# Patient Record
Sex: Male | Born: 1940 | Race: White | Hispanic: No | Marital: Married | Smoking: Never smoker
Health system: Southern US, Community
[De-identification: ages and names within clinical notes are randomized; demographics above are authoritative.]

## PROBLEM LIST (undated history)

## (undated) DIAGNOSIS — H9190 Unspecified hearing loss, unspecified ear: Secondary | ICD-10-CM

## (undated) DIAGNOSIS — E785 Hyperlipidemia, unspecified: Secondary | ICD-10-CM

## (undated) DIAGNOSIS — IMO0002 Reserved for concepts with insufficient information to code with codable children: Secondary | ICD-10-CM

## (undated) DIAGNOSIS — B019 Varicella without complication: Secondary | ICD-10-CM

## (undated) DIAGNOSIS — I10 Essential (primary) hypertension: Secondary | ICD-10-CM

## (undated) HISTORY — DX: Hyperlipidemia, unspecified: E78.5

## (undated) HISTORY — PX: OTHER SURGICAL HISTORY: SHX169

## (undated) HISTORY — PX: BLEPHAROPLASTY: SUR158

## (undated) HISTORY — DX: Unspecified hearing loss, unspecified ear: H91.90

## (undated) HISTORY — DX: Reserved for concepts with insufficient information to code with codable children: IMO0002

## (undated) HISTORY — DX: Essential (primary) hypertension: I10

## (undated) HISTORY — PX: CHOLECYSTECTOMY: SHX55

## (undated) HISTORY — DX: Varicella without complication: B01.9

---

## 2014-11-30 DIAGNOSIS — E780 Pure hypercholesterolemia: Secondary | ICD-10-CM | POA: Diagnosis not present

## 2014-11-30 DIAGNOSIS — I10 Essential (primary) hypertension: Secondary | ICD-10-CM | POA: Diagnosis not present

## 2015-06-07 LAB — CBC AND DIFFERENTIAL
HEMATOCRIT: 37 % — AB (ref 41–53)
HEMOGLOBIN: 13.3 g/dL — AB (ref 13.5–17.5)
PLATELETS: 214 10*3/uL (ref 150–399)
WBC: 4.9 10^3/mL

## 2015-06-07 LAB — PSA: PSA: 1.26

## 2015-09-15 LAB — HEMOGLOBIN A1C: Hemoglobin A1C: 6.1

## 2016-01-06 DIAGNOSIS — H04129 Dry eye syndrome of unspecified lacrimal gland: Secondary | ICD-10-CM | POA: Diagnosis not present

## 2016-01-06 DIAGNOSIS — H43399 Other vitreous opacities, unspecified eye: Secondary | ICD-10-CM | POA: Diagnosis not present

## 2016-01-06 DIAGNOSIS — Z961 Presence of intraocular lens: Secondary | ICD-10-CM | POA: Diagnosis not present

## 2016-02-02 ENCOUNTER — Ambulatory Visit: Payer: Self-pay | Admitting: Family

## 2016-02-29 ENCOUNTER — Ambulatory Visit (INDEPENDENT_AMBULATORY_CARE_PROVIDER_SITE_OTHER): Payer: Medicare Other | Admitting: Family

## 2016-02-29 ENCOUNTER — Encounter: Payer: Self-pay | Admitting: Family

## 2016-02-29 VITALS — BP 118/86 | HR 45 | Temp 98.0°F | Resp 14 | Wt 185.0 lb

## 2016-02-29 DIAGNOSIS — E782 Mixed hyperlipidemia: Secondary | ICD-10-CM

## 2016-02-29 DIAGNOSIS — I10 Essential (primary) hypertension: Secondary | ICD-10-CM | POA: Diagnosis not present

## 2016-02-29 MED ORDER — LISINOPRIL 5 MG PO TABS: 5.0000 mg | ORAL_TABLET | Freq: Every day | ORAL | 1 refills | Status: DC

## 2016-02-29 MED ORDER — ATORVASTATIN CALCIUM 40 MG PO TABS: 40.0000 mg | ORAL_TABLET | Freq: Every day | ORAL | 1 refills | Status: DC

## 2016-02-29 NOTE — Assessment & Plan Note (Signed)
Blood pressure appears adequately controlled below goal 140/90 with current regimen and no adverse side effects. Denies worse headache of life with no symptoms of end organ damage noted upon physical exam. Continue current dosage of lisinopril. Encouraged continue monitor blood pressure at home and follow low-sodium diet.

## 2016-02-29 NOTE — Patient Instructions (Addendum)
Thank you for choosing [facility].  SUMMARY AND INSTRUCTIONS:  Medication:  Continue to take your medications as prescribed.   Schedule a time for your annual wellness visit.  Your prescription(s) have been submitted to your pharmacy or been printed and provided for you. Please take as directed and contact our office if you believe you are having problem(s) with the medication(s) or have any questions.  Follow up:  If your symptoms worsen or fail to improve, please contact our office for further instruction, or in case of emergency go directly to the emergency room at the closest medical facility.

## 2016-02-29 NOTE — Progress Notes (Signed)
Subjective:    Patient ID: Travis Craig, male    DOB: [birth year], 75 y.o.   MRN: [id number]  Chief Complaint  Patient presents with  . Establish Care    refill of atorvastatin and BP meds    HPI:  Travis Craig is a 75 y.o. male who  has a past medical history of Chicken pox; Hyperlipidemia; Hypertension; and Ulcer ([place]). and presents today for an office visit to establish care.   1.) Hypertension -Currently maintained on lisinopril. Reports taking medication as prescribed and denies adverse side effects or cough. Blood pressures at home appears to be well controlled. Denies worse headache of life or new symptoms of end organ damage.    2.) Hyperlipidemia- Currently maintained on atorvastatin. Reports taking the medication as prescribed and denies adverse side effects or myalgias. Previous blood work reviewed and appears to be well controlled.    No Known Allergies    No outpatient prescriptions prior to visit.   No facility-administered medications prior to visit.      Past Medical History:  Diagnosis Date  . Chicken pox   . Hyperlipidemia   . Hypertension   . Ulcer [facility])       Past Surgical History:  Procedure Laterality Date  . CHOLECYSTECTOMY    . Ulcer surgery        Family History  Problem Relation Age of Onset  . Hypertension Father   . Alzheimer's disease Father       Social History   Social History  . Marital status: Married    Spouse name: N/A  . Number of children: 2  . Years of education: 16   Occupational History  . Retired    Social History Main Topics  . Smoking status: Never Smoker  . Smokeless tobacco: Never Used  . Alcohol use 1.2 oz/week    2 Glasses of wine per week  . Drug use: No  . Sexual activity: Not on file   Other Topics Concern  . Not on file   Social History Narrative   Fun: Active in the [place].   Denies abuse and feels safe at home.    Daughter has had a stroke and is limited walking.        Review of Systems  Constitutional: Negative for chills and fever.  Eyes:       Negative for changes in vision  Respiratory: Negative for cough, chest tightness and wheezing.   Cardiovascular: Negative for chest pain, palpitations and leg swelling.  Neurological: Negative for dizziness, weakness and light-headedness.       Objective:    BP 118/86 (BP Location: Left Arm, Patient Position: Sitting, Cuff Size: Large)   Pulse (!) 45   Temp 98 F (36.7 C) (Oral)   Resp 14   Wt 185 lb (83.9 kg)   SpO2 98%  Nursing note and vital signs reviewed.  Physical Exam  Constitutional: He is oriented to person, place, and time. He appears well-developed and well-nourished. No distress.  Hearing aids in place. Dressed appropriately for situation and answers all questions appropriately.  Cardiovascular: Normal rate, regular rhythm, normal heart sounds and intact distal pulses.   Pulmonary/Chest: Effort normal and breath sounds normal.  Neurological: He is alert and oriented to person, place, and time.  Skin: Skin is warm and dry.  Psychiatric: He has a normal mood and affect. His behavior is normal. Judgment and thought content normal.       Assessment & Plan:  Problem List Items Addressed This Visit      Cardiovascular and Mediastinum   Essential hypertension - Primary    Blood pressure appears adequately controlled below goal 140/90 with current regimen and no adverse side effects. Denies worse headache of life with no symptoms of end organ damage noted upon physical exam. Continue current dosage of lisinopril. Encouraged continue monitor blood pressure at home and follow low-sodium diet.      Relevant Medications   aspirin EC 81 MG tablet   atorvastatin (LIPITOR) 40 MG tablet   lisinopril (PRINIVIL,ZESTRIL) 5 MG tablet     Other   Mixed hyperlipidemia    Previous cholesterol reviewed in within normal ranges with current regimen and no adverse side effects or myalgias. No  cardiac symptoms presently. Continue current dosage of atorvastatin.      Relevant Medications   aspirin EC 81 MG tablet   atorvastatin (LIPITOR) 40 MG tablet   lisinopril (PRINIVIL,ZESTRIL) 5 MG tablet       I have discontinued Mr. Veal's tadalafil. I have also changed his atorvastatin and lisinopril. Additionally, I am having him maintain his aspirin EC, Multiple Vitamin (MULTI-VITAMIN DAILY PO), and Omega-3.   Meds ordered this encounter  Medications  . DISCONTD: lisinopril (PRINIVIL,ZESTRIL) 5 MG tablet    Sig: Take 5 mg by mouth daily.  Marland Kitchen DISCONTD: atorvastatin (LIPITOR) 40 MG tablet    Sig: Take 40 mg by mouth daily.  Marland Kitchen aspirin EC 81 MG tablet    Sig: Take 81 mg by mouth daily.  . Multiple Vitamin (MULTI-VITAMIN DAILY PO)    Sig: Take by mouth.  . DISCONTD: tadalafil (CIALIS) 20 MG tablet    Sig: Take 20 mg by mouth daily as needed for erectile dysfunction.  . Omega-3 1400 MG CAPS    Sig: Take 1,600 mg by mouth.  Marland Kitchen atorvastatin (LIPITOR) 40 MG tablet    Sig: Take 1 tablet (40 mg total) by mouth daily.    Dispense:  90 tablet    Refill:  1    Order Specific Question:   Supervising Provider    Answer:   Pricilla Holm A [id number]  . lisinopril (PRINIVIL,ZESTRIL) 5 MG tablet    Sig: Take 1 tablet (5 mg total) by mouth daily.    Dispense:  90 tablet    Refill:  1    Order Specific Question:   Supervising Provider    Answer:   Pricilla Holm A [id number]     Follow-up: Return in about 4 months (around 06/28/2016), or if symptoms worsen or fail to improve.  Mauricio Po, FNP

## 2016-02-29 NOTE — Assessment & Plan Note (Signed)
Previous cholesterol reviewed in within normal ranges with current regimen and no adverse side effects or myalgias. No cardiac symptoms presently. Continue current dosage of atorvastatin.

## 2016-03-07 ENCOUNTER — Encounter: Payer: Self-pay | Admitting: Family

## 2016-08-09 ENCOUNTER — Other Ambulatory Visit: Payer: Self-pay | Admitting: Family

## 2016-08-09 DIAGNOSIS — I10 Essential (primary) hypertension: Secondary | ICD-10-CM

## 2016-08-09 DIAGNOSIS — E782 Mixed hyperlipidemia: Secondary | ICD-10-CM

## 2017-01-16 DIAGNOSIS — Z23 Encounter for immunization: Secondary | ICD-10-CM | POA: Diagnosis not present

## 2017-01-17 DIAGNOSIS — H43399 Other vitreous opacities, unspecified eye: Secondary | ICD-10-CM | POA: Diagnosis not present

## 2017-01-17 DIAGNOSIS — Z961 Presence of intraocular lens: Secondary | ICD-10-CM | POA: Diagnosis not present

## 2017-01-17 DIAGNOSIS — H04129 Dry eye syndrome of unspecified lacrimal gland: Secondary | ICD-10-CM | POA: Diagnosis not present

## 2017-03-22 ENCOUNTER — Encounter: Payer: Self-pay | Admitting: Nurse Practitioner

## 2017-03-22 ENCOUNTER — Other Ambulatory Visit (INDEPENDENT_AMBULATORY_CARE_PROVIDER_SITE_OTHER): Payer: Medicare Other

## 2017-03-22 ENCOUNTER — Ambulatory Visit (INDEPENDENT_AMBULATORY_CARE_PROVIDER_SITE_OTHER): Payer: Medicare Other | Admitting: Nurse Practitioner

## 2017-03-22 VITALS — BP 180/90 | HR 43 | Temp 98.1°F | Resp 16 | Ht 71.0 in | Wt 186.8 lb

## 2017-03-22 DIAGNOSIS — R001 Bradycardia, unspecified: Secondary | ICD-10-CM | POA: Diagnosis not present

## 2017-03-22 DIAGNOSIS — I10 Essential (primary) hypertension: Secondary | ICD-10-CM | POA: Diagnosis not present

## 2017-03-22 DIAGNOSIS — E782 Mixed hyperlipidemia: Secondary | ICD-10-CM

## 2017-03-22 DIAGNOSIS — D649 Anemia, unspecified: Secondary | ICD-10-CM | POA: Diagnosis not present

## 2017-03-22 LAB — LIPID PANEL
CHOL/HDL RATIO: 2
Cholesterol: 126 mg/dL (ref 0–200)
HDL: 58.3 mg/dL (ref >39.00)
LDL CALC: 57 mg/dL (ref 0–99)
NonHDL: 67.48
TRIGLYCERIDES: 53 mg/dL (ref 0.0–149.0)
VLDL: 10.6 mg/dL (ref 0.0–40.0)

## 2017-03-22 LAB — COMPREHENSIVE METABOLIC PANEL
ALT: 22 U/L (ref 0–53)
AST: 30 U/L (ref 0–37)
Albumin: 3.9 g/dL (ref 3.5–5.2)
Alkaline Phosphatase: 66 U/L (ref 39–117)
BILIRUBIN TOTAL: 1.1 mg/dL (ref 0.2–1.2)
BUN: 12 mg/dL (ref 6–23)
CALCIUM: 8.6 mg/dL (ref 8.4–10.5)
CHLORIDE: 105 meq/L (ref 96–112)
CO2: 26 meq/L (ref 19–32)
CREATININE: 0.86 mg/dL (ref 0.40–1.50)
GFR: 91.49 mL/min (ref >60.00)
GLUCOSE: 98 mg/dL (ref 70–99)
Potassium: 4.2 mEq/L (ref 3.5–5.1)
SODIUM: 136 meq/L (ref 135–145)
Total Protein: 6.4 g/dL (ref 6.0–8.3)

## 2017-03-22 LAB — CBC
HEMATOCRIT: 38.9 % — AB (ref 39.0–52.0)
Hemoglobin: 13.4 g/dL (ref 13.0–17.0)
MCHC: 34.4 g/dL (ref 30.0–36.0)
MCV: 101.7 fl — AB (ref 78.0–100.0)
Platelets: 159 10*3/uL (ref 150.0–400.0)
RBC: 3.83 Mil/uL — AB (ref 4.22–5.81)
RDW: 12.9 % (ref 11.5–15.5)
WBC: 3.9 10*3/uL — ABNORMAL LOW (ref 4.0–10.5)

## 2017-03-22 MED ORDER — LISINOPRIL-HYDROCHLOROTHIAZIDE 10-12.5 MG PO TABS: 1.0000 | ORAL_TABLET | Freq: Every day | ORAL | 3 refills | Status: DC

## 2017-03-22 NOTE — Progress Notes (Signed)
Subjective:    Patient ID: Travis Craig, male    DOB: 10-Feb-1941, 76 y.o.   MRN: [id number]  HPI Mr. Washko is a 76 yo male who presents today to establish care. He is transferring to me from another provider in the same clinic. He also receives care from the [place]  Hypertension- maintained on lisinopril 5 mg daily. He reports taking the blood pressure medication daily as prescribed. hes not had any adverse effects of the medication. He denies headaches, vision changes. He does check his blood pressure every Tuesday and he's had labile readings-some "too low and some too high." he did not record the blood pressures.  BP Readings from Last 3 Encounters:  03/22/17 (!) 180/90  02/29/16 118/86    Hyperlipidemia- maintained on atorvastatin. Reports he takes the medication daily. Denies any adverse effects, myalgias.  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 He has had 2 stress tests at the [facility] In the past 5 years and were told they were both normal. He was not told to return for follow up care. He checks his pulse rate every Tuesday and the readings are always between 45-50. He is completely asymptomatic and denies weakness, dizziness, syncope, falls, chest pain, palpations, shortness of breath. 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 He walks about 5 miles a day.  Review of Systems  See HPI  Past Medical History:  Diagnosis Date  . Chicken pox   . Hyperlipidemia   . Hypertension   . Ulcer      Social History   Socioeconomic History  . Marital status: Married    Spouse name: Not on file  . Number of children: 2  . Years of education: 28  . Highest education level: Not on file  Social Needs  . Financial resource strain: Not on file  . Food insecurity - worry: Not on file  . Food insecurity - inability: Not on file  . Transportation needs - medical: Not on file  . Transportation  needs - non-medical: Not on file  Occupational History  . Occupation: Retired  Tobacco Use  . Smoking status: Never Smoker  . Smokeless tobacco: Never Used  Substance and Sexual Activity  . Alcohol use: Yes    Alcohol/week: 1.2 oz    Types: 2 Glasses of wine per week  . Drug use: No  . Sexual activity: Not on file  Other Topics Concern  . Not on file  Social History Narrative   Fun: Active in the [place].   Denies abuse and feels safe at home.    Daughter has had a stroke and is limited walking.     Past Surgical History:  Procedure Laterality Date  . CHOLECYSTECTOMY    . Ulcer surgery      Family History  Problem Relation Age of Onset  . Hypertension Father   . Alzheimer's disease Father     No Known Allergies  Current Outpatient Medications on File Prior to Visit  Medication Sig Dispense Refill  . aspirin EC 81 MG tablet Take 81 mg by mouth daily.    Marland Kitchen atorvastatin (LIPITOR) 40 MG tablet TAKE 1 TABLET DAILY 90 tablet 1  . lisinopril (PRINIVIL,ZESTRIL) 5 MG tablet TAKE 1 TABLET DAILY 90 tablet 1  . Multiple Vitamin (MULTI-VITAMIN DAILY PO) Take by mouth.    . Omega-3 1400 MG CAPS Take 1,600 mg by mouth.  No current facility-administered medications on file prior to visit.     BP (!) 180/90 (BP Location: Left Arm, Patient Position: Sitting, Cuff Size: Large)   Pulse (!) 43   Temp 98.1 F (36.7 C) (Oral)   Resp 16   Ht 5\' 11"  (1.803 m)   Wt 186 lb 12.8 oz (84.7 kg)   SpO2 98%   BMI 26.05 kg/m      Objective:   Physical Exam  Constitutional: He is oriented to person, place, and time. He appears well-developed and well-nourished. He does not appear ill. No distress.  Appears stated age.  HENT:  Head: Normocephalic and atraumatic.  Cardiovascular: Regular rhythm, normal heart sounds and intact distal pulses.  Slow heart rate  Pulmonary/Chest: Effort normal and breath sounds normal. No respiratory distress.  Musculoskeletal:  BLE with multiple  varicose veins, no redness, tenderness. BLE with mild nonpitting edema.  Neurological: He is alert and oriented to person, place, and time. Coordination normal.  Skin: Skin is warm and dry.  Psychiatric: He has a normal mood and affect. Judgment and thought content normal.      Assessment & Plan:

## 2017-03-22 NOTE — Assessment & Plan Note (Signed)
Blood pressure above goal 150/90 on lisinopril 5mg  daily. Denies headaches, vision changes.Marland Kitchen He walks 5 miles daily for exercise. We will start new bp medication:  - lisinopril-hydrochlorothiazide (PRINZIDE,ZESTORETIC) 10-12.5 MG tablet; Take 1 tablet by mouth daily.  Dispense: 30 tablet; Refill: 3 - Comprehensive metabolic panel; Future Hell keep BP log and RTC in 2 weeks with readings for a follow up visit.

## 2017-03-22 NOTE — Assessment & Plan Note (Signed)
CBC panels showed decreasd H&H last year. He reports past history of anemia. He is not currently maintained on medications for this. - CBC; Future today Will consider further workup if needed pending CBC

## 2017-03-22 NOTE — Patient Instructions (Addendum)
Please head downstairs for lab work.  I have sent a new blood pressure medication to your pharmacy lisinopril 10- HCTZ 12.5. This medication has a diuretic combined with your lisinopril, hopefully to bring your blood pressure down and maybe help with your edema as well. (please stop taking the lisinopril 5mg )  Please try to check your blood pressure once daily or at least a few times a week, at the same time each day, and keep a log. Id like for you to return in about 2 weeks with your blood pressure log, so we can decide if we need to make additional adjustments to your blood pressure medication.  I have placed a referral to cardiology. They are going to try to work you in for a visit next week. In the meantime, if you develop any weakness, dizziness, chest pain, or shortness of breath I would like for you to call 911 or have someone take you urgently to the nearest ER.  It was so nice to meet you. Thanks for letting me take care of you today :)

## 2017-03-22 NOTE — Assessment & Plan Note (Signed)
Maintained on atorvastatin with no adverse reactions - Lipid panel; Future. Reports daily exercise. Continue current dosage atorvastatin

## 2017-03-22 NOTE — Assessment & Plan Note (Signed)
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

## 2017-03-28 ENCOUNTER — Ambulatory Visit (INDEPENDENT_AMBULATORY_CARE_PROVIDER_SITE_OTHER): Payer: Medicare Other | Admitting: Interventional Cardiology

## 2017-03-28 ENCOUNTER — Encounter: Payer: Self-pay | Admitting: Interventional Cardiology

## 2017-03-28 VITALS — BP 162/92 | HR 66 | Ht 71.0 in | Wt 183.0 lb

## 2017-03-28 DIAGNOSIS — R001 Bradycardia, unspecified: Secondary | ICD-10-CM

## 2017-03-28 DIAGNOSIS — I10 Essential (primary) hypertension: Secondary | ICD-10-CM

## 2017-03-28 DIAGNOSIS — I8393 Asymptomatic varicose veins of bilateral lower extremities: Secondary | ICD-10-CM

## 2017-03-28 DIAGNOSIS — E782 Mixed hyperlipidemia: Secondary | ICD-10-CM

## 2017-03-28 NOTE — Progress Notes (Signed)
Cardiology Office Note   Date:  03/28/2017   ID:  Travis Craig, DOB 10/05/1940, MRN [id number]  PCP:  Lance Sell, NP    No chief complaint on file.  HTN, bradycardia  Wt Readings from Last 3 Encounters:  03/28/17 183 lb (83 kg)  03/22/17 186 lb 12.8 oz (84.7 kg)  02/29/16 185 lb (83.9 kg)       History of Present Illness: Travis Craig is a 76 y.o. male who is being seen today for the evaluation of bradycardia at the request of Lance Sell, NP.  He has a history of hypertension.  He has been maintained on lisinopril.  He has a history of bradycardia as well.  He has been evaluated at the [facility] with several stress tests in the past few years.  He was told they were normal.  When he checks his pulse at home, it is typically in the 45-50 range.  He had not complained of any symptoms.    His BP has been increasing over the past few years, since moving from [place].  He has been on lisinopril in the past, as high as 40 mg daily.  A few days ago, he was started on lisinopril / HCTZ 10/12.5 daily.  No increase in urination.  In the past, lisinopril was decreased due to balance problems.    Systolics at home are in the 170sr range.    Denies : Chest pain. Dizziness.  Nitroglycerin use. Orthopnea. Palpitations. Paroxysmal nocturnal dyspnea. Shortness of breath. Syncope.   Chronic ankle edema/varicose.  He walks up to 11 miles a day.  He has lost 35-40 lbs over the past few years.      Past Medical History:  Diagnosis Date  . Chicken pox   . Hyperlipidemia   . Hypertension   . Ulcer     Past Surgical History:  Procedure Laterality Date  . CHOLECYSTECTOMY    . Ulcer surgery       Current Outpatient Medications  Medication Sig Dispense Refill  . aspirin EC 81 MG tablet Take 81 mg by mouth daily.    Marland Kitchen atorvastatin (LIPITOR) 40 MG tablet TAKE 1 TABLET DAILY 90 tablet 1  . lisinopril-hydrochlorothiazide (PRINZIDE,ZESTORETIC)  10-12.5 MG tablet Take 1 tablet by mouth daily. 30 tablet 3  . Multiple Vitamin (MULTI-VITAMIN DAILY PO) Take by mouth.    . Omega-3 1400 MG CAPS Take 1,600 mg by mouth.     No current facility-administered medications for this visit.     Allergies:   Patient has no known allergies.    Social History:  The patient  reports that  has never smoked. he has never used smokeless tobacco. He reports that he drinks about 1.2 oz of alcohol per week. He reports that he does not use drugs.   Family History:  The patient's family history includes Alzheimer's disease in his father; Hypertension in his father.    ROS:  Please see the history of present illness.   Otherwise, review of systems are positive for chronic swelling.   All other systems are reviewed and negative.    PHYSICAL EXAM: VS:  BP (!) 162/92 (BP Location: Right Arm, Patient Position: Sitting, Cuff Size: Normal)   Pulse 66   Ht 5\' 11"  (1.803 m)   Wt 183 lb (83 kg)   SpO2 98%   BMI 25.52 kg/m  , BMI Body mass index is 25.52 kg/m. GEN: Well nourished, well developed, in no acute distress  HEENT: normal  Neck: no JVD, carotid bruits, or masses Cardiac: RRR; no murmurs, rubs, or gallops,; bilateral ankle edema  Respiratory:  clear to auscultation bilaterally, normal work of breathing GI: soft, nontender, nondistended, + BS MS: no deformity or atrophy ; prominent varicose veins bilaterally Skin: warm and dry, no rash Neuro:  Strength and sensation are intact Psych: euthymic mood, full affect   EKG:   The ekg ordered 03/22/17 demonstrates NSR, nonspecific ST changes   Recent Labs: 03/22/2017: ALT 22; BUN 12; Creatinine, Ser 0.86; Hemoglobin 13.4; Platelets 159.0; Potassium 4.2; Sodium 136   Lipid Panel    Component Value Date/Time   CHOL 126 03/22/2017 0924   TRIG 53.0 03/22/2017 0924   HDL 58.30 03/22/2017 0924   CHOLHDL 2 03/22/2017 0924   VLDL 10.6 03/22/2017 0924   LDLCALC 57 03/22/2017 0924     Other studies  Reviewed: Additional studies/ records that were reviewed today with results demonstrating: LDL 57 in 12/18.   ASSESSMENT AND PLAN:  1. HTN: Medication was recently increased.  If blood pressure continues to be high, would increase lisinopril to 20 mg.  He has follow-up with his primary care doctor.  HCTZ is reasonable as well given his ankle edema, although this is most likely related to varicose veins.  Try to minimize salt in the diet.  He stays at [facility] and is not in control, consult is necessarily in his food.  He does walk regularly.  We went over the minimum walking duration recommendations, which he exceeds. 2. Bradycardia: No symptoms of bradycardia.  Would not use any blood pressure lowering medicines that slow heart rate.  If lisinopril is maxed out, could use amlodipine. 3. Hyperlipidemia: Lipids well controlled.  Continue atorvastatin. 4. Varicose veins: Elevate legs.  Could consider compression stockings.  No symptoms with the varicose veins.   Current medicines are reviewed at length with the patient today.  The patient concerns regarding his medicines were addressed.  The following changes have been made:  No change  Labs/ tests ordered today include:  No orders of the defined types were placed in this encounter.   Recommend 150 minutes/week of aerobic exercise Low fat, low carb, high fiber diet recommended  Disposition:   FU in 1 year   Signed, Larae Grooms, MD  03/28/2017 10:10 AM    [facility] [address], [place], [place]  [address] Phone: [phone]; Fax: [phone]

## 2017-03-28 NOTE — Patient Instructions (Signed)

## 2017-04-10 ENCOUNTER — Encounter: Payer: Self-pay | Admitting: Nurse Practitioner

## 2017-04-10 ENCOUNTER — Other Ambulatory Visit (INDEPENDENT_AMBULATORY_CARE_PROVIDER_SITE_OTHER): Payer: Medicare Other

## 2017-04-10 ENCOUNTER — Ambulatory Visit (INDEPENDENT_AMBULATORY_CARE_PROVIDER_SITE_OTHER): Payer: Medicare Other | Admitting: Nurse Practitioner

## 2017-04-10 VITALS — BP 90/68 | HR 45 | Temp 97.5°F | Ht 71.0 in | Wt 187.0 lb

## 2017-04-10 DIAGNOSIS — I10 Essential (primary) hypertension: Secondary | ICD-10-CM

## 2017-04-10 LAB — BASIC METABOLIC PANEL
BUN: 20 mg/dL (ref 6–23)
CHLORIDE: 91 meq/L — AB (ref 96–112)
CO2: 25 mEq/L (ref 19–32)
CREATININE: 1 mg/dL (ref 0.40–1.50)
Calcium: 8.8 mg/dL (ref 8.4–10.5)
GFR: 76.87 mL/min (ref >60.00)
Glucose, Bld: 97 mg/dL (ref 70–99)
Potassium: 4.3 mEq/L (ref 3.5–5.1)
Sodium: 123 mEq/L — ABNORMAL LOW (ref 135–145)

## 2017-04-10 MED ORDER — LISINOPRIL 10 MG PO TABS: 10.0000 mg | ORAL_TABLET | Freq: Every day | ORAL | 1 refills | Status: DC

## 2017-04-10 NOTE — Patient Instructions (Addendum)
Please head downstairs for lab work.  STOP the lisinopril- HCTZ combo. START lisinopril 10mg  once daily. Our goal for you is to keep your blood pressure under 150/90. Please let me know if you are getting consistent readings under 90/60 or above 150/90.  Id like to see you back in about 1 month, to see if we are getting more stable blood pressure readings. Bring your readings from home if you are able.

## 2017-04-10 NOTE — Progress Notes (Signed)
Subjective:    Patient ID: Travis Craig, male    DOB: 30-Mar-1941, 76 y.o.   MRN: [id number]  HPI [name] is a 76 yo male who presents today for a follow up visit for hypertension. He was last seen in the office on 12/07 and had elevated blood pressure readings on lisinopril 5 daily. His lisinopril was increased to 10 daily and HCTZ 12.5 daily was added.   He started the new medication 1 week ago. He continues to have labile readings at home-Some lower readings 90s/60s, some highs 140s/90s. Reports more low readings than high. He has noticed increased urination since starting the new medication, and had once episode of dizziness when he quickly rose from his bed to use the restroom. He continues to have BLE edema which has not changed since starting the new medication. He denies weakness, syncope, headaches, vision changes, chest pain, shortness of breath.  BP Readings from Last 3 Encounters:  04/10/17 90/68  03/28/17 (!) 162/92  03/22/17 (!) 180/90    Review of Systems  See HPI  Past Medical History:  Diagnosis Date  . Chicken pox   . Hyperlipidemia   . Hypertension   . Ulcer      Social History   Socioeconomic History  . Marital status: Married    Spouse name: Not on file  . Number of children: 2  . Years of education: 56  . Highest education level: Not on file  Social Needs  . Financial resource strain: Not on file  . Food insecurity - worry: Not on file  . Food insecurity - inability: Not on file  . Transportation needs - medical: Not on file  . Transportation needs - non-medical: Not on file  Occupational History  . Occupation: Retired  Tobacco Use  . Smoking status: Never Smoker  . Smokeless tobacco: Never Used  Substance and Sexual Activity  . Alcohol use: Yes    Alcohol/week: 1.2 oz    Types: 2 Glasses of wine per week  . Drug use: No  . Sexual activity: Not on file  Other Topics Concern  . Not on file  Social History Narrative   Fun:  Active in the [place].   Denies abuse and feels safe at home.    Daughter has had a stroke and is limited walking.     Past Surgical History:  Procedure Laterality Date  . CHOLECYSTECTOMY    . Ulcer surgery      Family History  Problem Relation Age of Onset  . Hypertension Father   . Alzheimer's disease Father     No Known Allergies  Current Outpatient Medications on File Prior to Visit  Medication Sig Dispense Refill  . aspirin EC 81 MG tablet Take 81 mg by mouth daily.    Marland Kitchen atorvastatin (LIPITOR) 40 MG tablet TAKE 1 TABLET DAILY 90 tablet 1  . lisinopril-hydrochlorothiazide (PRINZIDE,ZESTORETIC) 10-12.5 MG tablet Take 1 tablet by mouth daily. 30 tablet 3  . Multiple Vitamin (MULTI-VITAMIN DAILY PO) Take by mouth.    . Omega-3 1400 MG CAPS Take 1,600 mg by mouth.     No current facility-administered medications on file prior to visit.     BP 90/68   Pulse (!) 45   Temp (!) 97.5 F (36.4 C) (Oral)   Ht 5\' 11"  (1.803 m)   Wt 187 lb (84.8 kg)   SpO2 93%   BMI 26.08 kg/m        Objective:   Physical Exam  Constitutional: He is oriented to person, place, and time. He appears well-developed and well-nourished. No distress.  HENT:  Head: Normocephalic and atraumatic.  Cardiovascular: Normal rate, normal heart sounds and intact distal pulses.  Pulmonary/Chest: Effort normal and breath sounds normal.  Musculoskeletal: He exhibits edema.  Nonpitting edema. Varicose veins bilaterally.  Neurological: He is alert and oriented to person, place, and time. He has normal strength. Coordination and gait normal. GCS eye subscore is 4. GCS verbal subscore is 5. GCS motor subscore is 6.  Skin: Skin is warm and dry.  Psychiatric: He has a normal mood and affect. Judgment and thought content normal.       Assessment & Plan:

## 2017-04-10 NOTE — Assessment & Plan Note (Signed)
BP now on low end with new medication-lisinopril 10-hctz 12.5. He also has felt dizzy and no improvement in his lower extremity edema, which is most likely due to his varicose veins. We will D/c lisinopril 10- hctz 12.5 combo and start lisinopril 10 once daily. RTC in 1 month for F/U. BP parameters given   Medications ordered: - lisinopril (PRINIVIL,ZESTRIL) 10 MG tablet; Take 1 tablet (10 mg total) by mouth daily.  Dispense: 30 tablet; Refill: 1 Diagnostic testing ordered: - Basic metabolic panel; Future

## 2017-04-11 ENCOUNTER — Emergency Department (HOSPITAL_COMMUNITY)
Admission: EM | Admit: 2017-04-11 | Discharge: 2017-04-11 | Disposition: A | Discharge status: Home / Self Care | Payer: Medicare Other | Attending: Emergency Medicine | Admitting: Emergency Medicine

## 2017-04-11 ENCOUNTER — Other Ambulatory Visit: Payer: Self-pay

## 2017-04-11 ENCOUNTER — Encounter (HOSPITAL_COMMUNITY): Payer: Self-pay

## 2017-04-11 ENCOUNTER — Telehealth: Payer: Self-pay | Admitting: Nurse Practitioner

## 2017-04-11 DIAGNOSIS — I10 Essential (primary) hypertension: Secondary | ICD-10-CM | POA: Diagnosis not present

## 2017-04-11 DIAGNOSIS — E871 Hypo-osmolality and hyponatremia: Secondary | ICD-10-CM | POA: Insufficient documentation

## 2017-04-11 DIAGNOSIS — Z7982 Long term (current) use of aspirin: Secondary | ICD-10-CM | POA: Insufficient documentation

## 2017-04-11 DIAGNOSIS — Z79899 Other long term (current) drug therapy: Secondary | ICD-10-CM | POA: Diagnosis not present

## 2017-04-11 LAB — CBC WITH DIFFERENTIAL/PLATELET
BASOS ABS: 0 10*3/uL (ref 0.0–0.1)
Basophils Relative: 0 %
Eosinophils Absolute: 0.3 10*3/uL (ref 0.0–0.7)
Eosinophils Relative: 7 %
HEMATOCRIT: 40.3 % (ref 39.0–52.0)
HEMOGLOBIN: 14.9 g/dL (ref 13.0–17.0)
LYMPHS PCT: 26 %
Lymphs Abs: 1.3 10*3/uL (ref 0.7–4.0)
MCH: 35.2 pg — ABNORMAL HIGH (ref 26.0–34.0)
MCHC: 37 g/dL — ABNORMAL HIGH (ref 30.0–36.0)
MCV: 95.3 fL (ref 78.0–100.0)
MONO ABS: 0.5 10*3/uL (ref 0.1–1.0)
Monocytes Relative: 10 %
NEUTROS ABS: 2.9 10*3/uL (ref 1.7–7.7)
Neutrophils Relative %: 57 %
Platelets: 174 10*3/uL (ref 150–400)
RBC: 4.23 MIL/uL (ref 4.22–5.81)
RDW: 11.9 % (ref 11.5–15.5)
WBC: 5.1 10*3/uL (ref 4.0–10.5)

## 2017-04-11 LAB — COMPREHENSIVE METABOLIC PANEL
ALBUMIN: 4.3 g/dL (ref 3.5–5.0)
ALT: 33 U/L (ref 17–63)
ANION GAP: 8 (ref 5–15)
AST: 42 U/L — ABNORMAL HIGH (ref 15–41)
Alkaline Phosphatase: 75 U/L (ref 38–126)
BILIRUBIN TOTAL: 1.3 mg/dL — AB (ref 0.3–1.2)
BUN: 15 mg/dL (ref 6–20)
CO2: 25 mmol/L (ref 22–32)
Calcium: 9.3 mg/dL (ref 8.9–10.3)
Chloride: 93 mmol/L — ABNORMAL LOW (ref 101–111)
Creatinine, Ser: 0.91 mg/dL (ref 0.61–1.24)
GFR calc Af Amer: >60 mL/min (ref >60)
GFR calc non Af Amer: >60 mL/min (ref >60)
GLUCOSE: 103 mg/dL — AB (ref 65–99)
POTASSIUM: 4.4 mmol/L (ref 3.5–5.1)
Sodium: 126 mmol/L — ABNORMAL LOW (ref 135–145)
TOTAL PROTEIN: 7.8 g/dL (ref 6.5–8.1)

## 2017-04-11 MED ORDER — SODIUM CHLORIDE 0.9 % IV BOLUS (SEPSIS)
500.0000 mL | Freq: Once | INTRAVENOUS | Status: AC
  Administered 2017-04-11: 500 mL via INTRAVENOUS

## 2017-04-11 NOTE — ED Provider Notes (Signed)
Medical screening examination/treatment/procedure(s) were conducted as a shared visit with non-physician practitioner(s) and myself.  I personally evaluated the patient during the encounter.   EKG Interpretation None     She went in for routine lab work and was found to be hyponatremic.  He had been referred to the emergency department.  Patient denies any symptoms.  He reports he has felt well.  He was started on hydrochlorothiazide 1 week ago as a new medication.  Patient is alert and nontoxic.  Clinically well in appearance.  No respiratory distress.  Heart is regular and bradycardic.  No gross rub murmur gallop.  Lungs clear without wheeze rhonchi or rale.  Trace peripheral edema which patient reports is a baseline for him.  Suspect hydrochlorothiazide as etiology for hyponatremia.  I agree with plan of management.   Charlesetta Shanks, MD 04/11/17 [phone]

## 2017-04-11 NOTE — ED Notes (Signed)
Bed: WA17 Expected date:  Expected time:  Means of arrival:  Comments: EMS 

## 2017-04-11 NOTE — ED Notes (Signed)
Tech asked to perform EKG

## 2017-04-11 NOTE — ED Provider Notes (Signed)
[facility] DEPT Provider Note   CSN: [id number] Arrival date & time: 04/11/17  1150     History   Chief Complaint Chief Complaint  Patient presents with  . Abnormal Lab    sodium-123    HPI Travis Craig is a 76 y.o. male.  HPI   Patient is a 76 year old male with a history of hypertension and hyperlipidemia presenting at the request of his primary care provider for hyponatremia.  Patient was started on hydrochlorothiazide 1 week ago for hypertension and brought back for recheck of electrolytes by his primary care provider yesterday.  Patient found to have a sodium of 123 yesterday.  Patient was then advised of these results and instructed to come to the emergency department.  Previous value on 03-22-2017 demonstrates a sodium of 136.  Patient denies any recent GI illnesses of emesis or diarrhea.  Patient has no history of heart failure, kidney disease, or liver disease.  Patient started no other new medications.  Patient denies any headaches, visual disturbance, nausea, vomiting,  increased muscle cramping.   patient's wife reports that he may be a little bit more confused and forgetful than he was prior.  Past Medical History:  Diagnosis Date  . Chicken pox   . Hyperlipidemia   . Hypertension   . Ulcer     Patient Active Problem List   Diagnosis Date Noted  . 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 Anemia 03/22/2017  . Essential hypertension 02/29/2016  . Mixed hyperlipidemia 02/29/2016    Past Surgical History:  Procedure Laterality Date  . CHOLECYSTECTOMY    . Ulcer surgery         Home Medications    Prior to Admission medications   Medication Sig Start Date End Date Taking? Authorizing Provider  aspirin EC 81 MG tablet Take 81 mg by mouth daily.   Yes [provider]  atorvastatin (LIPITOR) 40 MG tablet TAKE 1 TABLET DAILY 08/09/16  Yes Golden Circle, FNP  lisinopril  (PRINIVIL,ZESTRIL) 10 MG tablet Take 1 tablet (10 mg total) by mouth daily. 04/10/17  Yes Lance Sell, NP  lisinopril-hydrochlorothiazide (PRINZIDE,ZESTORETIC) 10-12.5 MG tablet Take 1 tablet by mouth daily. 03/22/17  Yes [provider]  Multiple Vitamin (MULTI-VITAMIN DAILY PO) Take by mouth.   Yes [provider]  Omega-3 1400 MG CAPS Take 1,600 mg by mouth.   Yes [provider]    Family History Family History  Problem Relation Age of Onset  . Hypertension Father   . Alzheimer's disease Father     Social History Social History   Tobacco Use  . Smoking status: Never Smoker  . Smokeless tobacco: Never Used  Substance Use Topics  . Alcohol use: Yes    Alcohol/week: 1.2 oz    Types: 2 Glasses of wine per week  . Drug use: No     Allergies   Patient has no known allergies.   Review of Systems Review of Systems  Constitutional: Negative for chills and fever.  HENT: Negative for congestion and rhinorrhea.   Eyes: Negative for visual disturbance.  Respiratory: Negative for shortness of breath.   Cardiovascular: Negative for chest pain and leg swelling.  Gastrointestinal: Negative for abdominal pain, diarrhea, nausea and vomiting.  Genitourinary: Negative for decreased urine volume and dysuria.  Musculoskeletal: Negative for gait problem.  Skin: Negative for color change.  Neurological: Negative for dizziness, tremors, syncope, weakness, numbness and headaches.  Psychiatric/Behavioral: Positive  for decreased concentration.       + Decreased mental acuity.     Physical Exam Updated Vital Signs BP 125/74   Pulse (!) 47   Temp 97.9 F (36.6 C) (Oral)   Resp 16   Ht 5\' 11"  (1.803 m)   Wt 84.8 kg (187 lb)   SpO2 97%   BMI 26.08 kg/m   Physical Exam  Constitutional: He appears well-developed and well-nourished. No distress.  HENT:  Head: Normocephalic and atraumatic.  Mouth/Throat: Oropharynx is clear and moist.  Eyes:  Conjunctivae and EOM are normal. Pupils are equal, round, and reactive to light.  Neck: Normal range of motion. Neck supple.  Cardiovascular: Regular rhythm, S1 normal and S2 normal.  No murmur heard. There is slightly increased, nonpitting lower extremity edema of the left lower extremity.  Patient reports this is chronic. Patient exhibits a bradycardic pulse approximately 47-50.  Pulmonary/Chest: Effort normal and breath sounds normal. He has no wheezes. He has no rales.  Abdominal: Soft. He exhibits no distension. There is no tenderness. There is no guarding.  Musculoskeletal: Normal range of motion. He exhibits no edema or deformity.  Neurological: He is alert.  Cranial nerves grossly intact. Gait is normal and symmetric with good coordination.  Skin: Skin is warm and dry. No rash noted. No erythema.  Psychiatric: He has a normal mood and affect. His behavior is normal. Judgment and thought content normal.  Nursing note and vitals reviewed.    ED Treatments / Results  Labs (all labs ordered are listed, but only abnormal results are displayed) Labs Reviewed  COMPREHENSIVE METABOLIC PANEL - Abnormal; Notable for the following components:      Result Value   Sodium 126 (*)    Chloride 93 (*)    Glucose, Bld 103 (*)    AST 42 (*)    Total Bilirubin 1.3 (*)    All other components within normal limits  CBC WITH DIFFERENTIAL/PLATELET - Abnormal; Notable for the following components:   MCH 35.2 (*)    MCHC 37.0 (*)    All other components within normal limits  URINALYSIS, ROUTINE W REFLEX MICROSCOPIC    EKG  EKG Interpretation None       Radiology No results found.  Procedures Procedures (including critical care time)  Medications Ordered in ED Medications - No data to display   Initial Impression / Assessment and Plan / ED Course  I have reviewed the triage vital signs and the nursing notes.  Pertinent labs & imaging results that were available during my care of  the patient were reviewed by me and considered in my medical decision making (see chart for details).    Final Clinical Impressions(s) / ED Diagnoses   Final diagnoses:  Hyponatremia   Patient is nontoxic-appearing and in no acute distress.  Patient does exhibit a bradycardic pulse, which he reports is chronic for him.  Patient exhibits euvolemia.  Patient is alert and oriented x3, mentating well, and neurologically intact.  Patient has no recent risk factors for hypo-or hypervolemia.  Only identifiable risk factor for hyponatremia at present is recent HCTZ starting.  I suspect this is the cause of patient's hyponatremia.  Will initiate a hospital medicine consult for hyponatremia management and discussion of whether patient is appropriate for outpatient at this time.  6:59 PM Spoke with Dr. Lorin Mercy of hospital medicine who recommended 500 mL bolus of normal saline and follow-up tomorrow with primary care provider for repeat lab work.  She reports  that the patient is a somatic, sodium will correct on its own as long as hydrochlorthiazide was stopped.  Subsequent to 500 mL liter bolus of normal saline, patient continues to be neurologically intact and stable.  I observed patient ambulating in the department   Without difficulty.  Patient was instructed to follow-up with his primary care provider for a recheck of blood work tomorrow to ensure that sodium is rising.  Patient was given return precautions for any signs of worsening neurologic function such as confusion, change in mentation, or other signs of hyponatremia such as nausea, vomiting, muscle cramping.  Patient and his spouse are in understanding and agree with the plan of care.  This is a shared visit with Dr. Charlesetta Shanks. Patient was independently evaluated by this attending physician. Attending physician consulted in evaluation and discharge management.  ED Discharge Orders    None       Tamala Julian 04/12/17 0059      Albesa Seen, PA-C 04/12/17 0100    Charlesetta Shanks, MD 04/13/17 1022

## 2017-04-11 NOTE — Discharge Instructions (Signed)
Please see the information and instructions below regarding your visit.  Your diagnoses today include:  1. Hyponatremia    I spoke with an internal medicine physician and patient today who recommended that we recheck her sodium tomorrow.  We believe that the drop in your sodium is due to hydrochlorothiazide and stopping this medication will help reverse that.    Your examination and testing is reassuring today.  Tests performed today include: See side panel of your discharge paperwork for testing performed today. Vital signs are listed at the bottom of these instructions.   Electrolytes, blood counts, urine testing, and urine electrolytes.  Medications prescribed:    Take any prescribed medications only as prescribed, and any over the counter medications only as directed on the packaging.  Home care instructions:  Please follow any educational materials contained in this packet.   You may eat and drink per your baseline.  You may continue to eat normal meals.  Follow-up instructions: Please follow-up with your primary care provider TOMORROW for further evaluation of your symptoms if they are not completely improved.   Return instructions:  Please return to the Emergency Department if you experience worsening symptoms.  Please return to the emergency department if there are any signs that you are unresponsive, not acting yourself, significantly confused, having a severe headache, nauseous or vomiting, severe muscle cramping, or any seizure-like activity.  Seizing and altered mental status is a medical emergency in the setting of low sodium. Please return if you have any other emergent concerns.  Additional Information:   Your vital signs today were: BP (!) 143/78    Pulse (!) 42    Temp 97.9 F (36.6 C) (Oral)    Resp 12    Ht 5\' 11"  (1.803 m)    Wt 84.8 kg (187 lb)    SpO2 95%    BMI 26.08 kg/m  If your blood pressure (BP) was elevated on multiple readings during this visit  above 130 for the top number or above 80 for the bottom number, please have this repeated by your primary care provider within one month. --------------  Thank you for allowing [place] to participate in your care today.

## 2017-04-11 NOTE — Progress Notes (Signed)
Called by ER for request of consult on this patient.  Patient recently started on HCTZ, Na++ decreased from 136 to 123 yesterday, up to 126 today.  Patient is euvolemic and asymptomatic other than possibly more forgetful and confused.  H/o hyponatremia with dehydration in the past.  Recommend small bolus (+/-), stop HCTZ, check urinary electrolytes.  F/u with PCP for repeat lab work tomorrow.  Since it is already improving with cessation of the offending agent and the patient is asymptomatic and not critically low, it is reasonable to discharge him at this time.  Carlyon Shadow, M.D.

## 2017-04-11 NOTE — ED Triage Notes (Signed)
Patient reports that his PCP left a message yesterday that his Sodium level was 123.

## 2017-04-11 NOTE — ED Notes (Addendum)
Pt is alert and oriented x 4 and is verbally responsive. Pt is accompanied with spouse. Pt reports that he was taking HCTZ and had taken his last dose yesterday, and pt reports that his lisinopril was doubled. Pt is HOH.

## 2017-04-11 NOTE — Telephone Encounter (Signed)
Pt advised by PCP to go to ED yesterday d/t low sodium level. Pt is calling back to see if he still needs to go. Called PCP office and spoke to [place] who stated to send him to ED now. Pt to be driven to [facility] long ED.

## 2017-04-11 NOTE — ED Notes (Signed)
Bed: [facility]1 Expected date:  Expected time:  Means of arrival:  Comments: 

## 2017-04-15 ENCOUNTER — Ambulatory Visit (INDEPENDENT_AMBULATORY_CARE_PROVIDER_SITE_OTHER): Payer: Medicare Other | Admitting: Nurse Practitioner

## 2017-04-15 ENCOUNTER — Encounter: Payer: Self-pay | Admitting: Nurse Practitioner

## 2017-04-15 ENCOUNTER — Other Ambulatory Visit: Payer: Self-pay | Admitting: Nurse Practitioner

## 2017-04-15 ENCOUNTER — Other Ambulatory Visit (INDEPENDENT_AMBULATORY_CARE_PROVIDER_SITE_OTHER): Payer: Medicare Other

## 2017-04-15 VITALS — BP 158/84 | HR 52 | Temp 97.8°F | Resp 16 | Ht 71.0 in | Wt 186.0 lb

## 2017-04-15 DIAGNOSIS — I10 Essential (primary) hypertension: Secondary | ICD-10-CM | POA: Diagnosis not present

## 2017-04-15 DIAGNOSIS — E871 Hypo-osmolality and hyponatremia: Secondary | ICD-10-CM

## 2017-04-15 LAB — COMPREHENSIVE METABOLIC PANEL
ALK PHOS: 63 U/L (ref 39–117)
ALT: 23 U/L (ref 0–53)
AST: 31 U/L (ref 0–37)
Albumin: 3.8 g/dL (ref 3.5–5.2)
BUN: 13 mg/dL (ref 6–23)
CHLORIDE: 97 meq/L (ref 96–112)
CO2: 25 mEq/L (ref 19–32)
Calcium: 8.6 mg/dL (ref 8.4–10.5)
Creatinine, Ser: 0.87 mg/dL (ref 0.40–1.50)
GFR: 90.27 mL/min (ref >60.00)
GLUCOSE: 99 mg/dL (ref 70–99)
POTASSIUM: 4.5 meq/L (ref 3.5–5.1)
SODIUM: 129 meq/L — AB (ref 135–145)
TOTAL PROTEIN: 6.5 g/dL (ref 6.0–8.3)
Total Bilirubin: 0.9 mg/dL (ref 0.2–1.2)

## 2017-04-15 MED ORDER — LISINOPRIL 10 MG PO TABS: 10.0000 mg | ORAL_TABLET | Freq: Two times a day (BID) | ORAL | 2 refills | Status: DC

## 2017-04-15 NOTE — Patient Instructions (Signed)
Please head downstairs for lab work.  I do think that we should increase your lisinopril to 20mg  a day, with a goal to keep your blood pressure below 150/90.  Lets try to change the way you take your lisinopril, to see if we can get more stable readings throughout the day. Please take your lisinopril 10mg  once in the morning and 10mg  once in the evening.  Id like to see you back in about 1 month, to see how your blood pressure is doing.  It was good to see you. Happy new year!!!!!

## 2017-04-15 NOTE — Progress Notes (Signed)
orders

## 2017-04-15 NOTE — Progress Notes (Signed)
Subjective:    Patient ID: Travis Craig, male    DOB: 09-Mar-1941, 76 y.o.   MRN: [id number]  HPI Mr. Dai is returning to the office for a follow up visit today. He was last seen on 12/26 for a follow up visit of his hypertension. He had started taking lisinopril-HCTZ combo about 1 week prior to 12/26. In the office on 12/26, his blood pressure was actually quite low and he c/o one episode of dizziness when standing up quickly since starting HCTZ. He was given instructions to stop HCTZ and BMET was ordered. On his BMET 12/26, his sodium was 123. He was called and instructed to go to the ER urgently for management of hyponatremia.  He was seen in the ER on 12/27. He was given a 500cc bolus of normal saline and he was discharged with instructions to follow up with PCP for lab re-check. BMET on 12/27 in the ER with sodium 126.  He did stop the lisinopril-HCTZ combo on 12/27 and began taking only lisinopril 10 daily for his blood pressure. hes been checking his blood pressure daily at home with readings: 120-155/80s. He reports he feels well. He denies any headaches, dizziness, weakness, confusion, vision changes, chest pain, shortness of breath, nausea, vomiting.  BP Readings from Last 3 Encounters:  04/15/17 (!) 158/84  04/11/17 (!) 143/78  04/10/17 90/68    Review of Systems  See HPI  Past Medical History:  Diagnosis Date  . Chicken pox   . Hyperlipidemia   . Hypertension   . Ulcer      Social History   Socioeconomic History  . Marital status: Married    Spouse name: Not on file  . Number of children: 2  . Years of education: 51  . Highest education level: Not on file  Social Needs  . Financial resource strain: Not on file  . Food insecurity - worry: Not on file  . Food insecurity - inability: Not on file  . Transportation needs - medical: Not on file  . Transportation needs - non-medical: Not on file  Occupational History  . Occupation: Retired  Tobacco Use  .  Smoking status: Never Smoker  . Smokeless tobacco: Never Used  Substance and Sexual Activity  . Alcohol use: Yes    Alcohol/week: 1.2 oz    Types: 2 Glasses of wine per week  . Drug use: No  . Sexual activity: Not on file  Other Topics Concern  . Not on file  Social History Narrative   Fun: Active in the [place].   Denies abuse and feels safe at home.    Daughter has had a stroke and is limited walking.     Past Surgical History:  Procedure Laterality Date  . CHOLECYSTECTOMY    . Ulcer surgery      Family History  Problem Relation Age of Onset  . Hypertension Father   . Alzheimer's disease Father     No Known Allergies  Current Outpatient Medications on File Prior to Visit  Medication Sig Dispense Refill  . aspirin EC 81 MG tablet Take 81 mg by mouth daily.    Marland Kitchen atorvastatin (LIPITOR) 40 MG tablet TAKE 1 TABLET DAILY 90 tablet 1  . lisinopril (PRINIVIL,ZESTRIL) 10 MG tablet Take 1 tablet (10 mg total) by mouth daily. 30 tablet 1  . Multiple Vitamin (MULTI-VITAMIN DAILY PO) Take by mouth.    . Omega-3 1400 MG CAPS Take 1,600 mg by mouth.     No  current facility-administered medications on file prior to visit.     BP (!) 158/84 (BP Location: Left Arm, Patient Position: Sitting, Cuff Size: Normal)   Pulse (!) 52   Temp 97.8 F (36.6 C) (Oral)   Resp 16   Ht 5\' 11"  (1.803 m)   Wt 186 lb (84.4 kg)   SpO2 98%   BMI 25.94 kg/m       Objective:   Physical Exam  Constitutional: He is oriented to person, place, and time. He appears well-developed and well-nourished. No distress.  HENT:  Head: Normocephalic and atraumatic.  Cardiovascular: Normal rate, regular rhythm, normal heart sounds and intact distal pulses.  Pulmonary/Chest: Effort normal and breath sounds normal.  Musculoskeletal: He exhibits edema.  Nonpitting edema. Varicose veins to BLE  Neurological: He is alert and oriented to person, place, and time. Coordination normal.  Skin: Skin is warm and  dry.  Psychiatric: He has a normal mood and affect. Judgment and thought content normal.      Assessment & Plan:  Hyponatremia Follow up sodium level. Diagnostic testing ordered: - Comprehensive metabolic panel; Future

## 2017-04-15 NOTE — Assessment & Plan Note (Signed)
Essential hypertension BP readings have increased on lisinopril 10. He reports higher readings in the am. We will try to increase to lisinopril 20 and split dose between am and pm, to see if we can get more stable readings throughout the day. Diagnostic testing ordered: - Comprehensive metabolic panel; Future Medications ordered: - lisinopril (PRINIVIL,ZESTRIL) 10 MG tablet; Take 1 tablet (10 mg total) by mouth 2 (two) times daily.  Dispense: 180 tablet; Refill: 2 RTC in 1 month for follow up

## 2017-04-18 ENCOUNTER — Other Ambulatory Visit (INDEPENDENT_AMBULATORY_CARE_PROVIDER_SITE_OTHER): Payer: Medicare Other

## 2017-04-18 DIAGNOSIS — E871 Hypo-osmolality and hyponatremia: Secondary | ICD-10-CM | POA: Diagnosis not present

## 2017-04-18 LAB — BASIC METABOLIC PANEL
BUN: 12 mg/dL (ref 6–23)
CALCIUM: 8.7 mg/dL (ref 8.4–10.5)
CO2: 25 mEq/L (ref 19–32)
CREATININE: 0.92 mg/dL (ref 0.40–1.50)
Chloride: 97 mEq/L (ref 96–112)
GFR: 84.63 mL/min (ref >60.00)
Glucose, Bld: 99 mg/dL (ref 70–99)
Potassium: 4.2 mEq/L (ref 3.5–5.1)
Sodium: 129 mEq/L — ABNORMAL LOW (ref 135–145)

## 2017-04-19 ENCOUNTER — Other Ambulatory Visit: Payer: Self-pay | Admitting: Nurse Practitioner

## 2017-04-19 DIAGNOSIS — E871 Hypo-osmolality and hyponatremia: Secondary | ICD-10-CM

## 2017-04-19 NOTE — Progress Notes (Signed)
Referral order

## 2017-05-13 ENCOUNTER — Encounter: Payer: Self-pay | Admitting: Nurse Practitioner

## 2017-05-13 ENCOUNTER — Ambulatory Visit (INDEPENDENT_AMBULATORY_CARE_PROVIDER_SITE_OTHER): Payer: Medicare Other | Admitting: Nurse Practitioner

## 2017-05-13 ENCOUNTER — Other Ambulatory Visit (INDEPENDENT_AMBULATORY_CARE_PROVIDER_SITE_OTHER): Payer: Medicare Other

## 2017-05-13 VITALS — BP 138/78 | HR 43 | Temp 98.0°F | Resp 16 | Ht 71.0 in | Wt 186.8 lb

## 2017-05-13 DIAGNOSIS — E871 Hypo-osmolality and hyponatremia: Secondary | ICD-10-CM | POA: Diagnosis not present

## 2017-05-13 DIAGNOSIS — I10 Essential (primary) hypertension: Secondary | ICD-10-CM

## 2017-05-13 DIAGNOSIS — E782 Mixed hyperlipidemia: Secondary | ICD-10-CM

## 2017-05-13 LAB — BASIC METABOLIC PANEL
BUN: 14 mg/dL (ref 6–23)
CALCIUM: 8.6 mg/dL (ref 8.4–10.5)
CO2: 26 mEq/L (ref 19–32)
CREATININE: 0.85 mg/dL (ref 0.40–1.50)
Chloride: 101 mEq/L (ref 96–112)
GFR: 92.7 mL/min (ref >60.00)
GLUCOSE: 102 mg/dL — AB (ref 70–99)
Potassium: 4.3 mEq/L (ref 3.5–5.1)
SODIUM: 134 meq/L — AB (ref 135–145)

## 2017-05-13 MED ORDER — ATORVASTATIN CALCIUM 40 MG PO TABS: 40.0000 mg | ORAL_TABLET | Freq: Every day | ORAL | 2 refills | Status: DC

## 2017-05-13 NOTE — Assessment & Plan Note (Signed)
Currently awaiting nephrology appointment. He will continue to add salt to his diet for now. - Basic metabolic panel; Future

## 2017-05-13 NOTE — Patient Instructions (Signed)
Please head downstairs for lab work.  Ill see you back in about 6 months, or sooner if you need me.  It was good to see you. Thanks for letting me take care of you today :)

## 2017-05-13 NOTE — Assessment & Plan Note (Addendum)
BP appears stable now on lisinopril 10BID, continue. We discussed normal BP parameters at home, with <150/90 as goal. He will continue to have BP checked by staff at independent living community once a week and notify me for any elevated readings over 974/71 - Basic metabolic panel; Future

## 2017-05-13 NOTE — Assessment & Plan Note (Signed)
Stable, continue atorvastatin - atorvastatin (LIPITOR) 40 MG tablet; Take 1 tablet (40 mg total) by mouth daily.  Dispense: 90 tablet; Refill: 2

## 2017-05-13 NOTE — Progress Notes (Signed)
Name: Travis Craig   MRN: [id number]    DOB: August 12, 1940   Date:05/13/2017       Progress Note  Subjective  Chief Complaint  Chief Complaint  Patient presents with  . Follow-up    BP and sodium level check    HPI Travis Craig returns today for a follow up of his blood pressure and sodium level and requests a refill of his cholesterol medication  Hypertension- His lisinopril was increased from 10 to 20 daily on his last visit on 12/31 due to repeated elevated blood pressure readings. Prior to 12/31, He had been taken off lisinopril/HCTZ combination due to repeated low sodium readings- one reading of 123 on 12/26 which he had to go to the emergency room for. Since stopping the HCTZ, he has had 2 additional BMETs on 12/31  And 1/3 with continued hyponatremia so a referral was placed to nephrology for further workup. [facility] has reviewed the patients record and plans to schedule an appointment with him in the next 2 months. Today, he reports he has been taking his lisinopril 10 mg BID- 10 in the am and 10 in the pm daily since our last visit. He has been having his blood pressure checked once a week by staff at his independent living community- readings over past two weeks have been 138/80, 130/80.  He has been increasing daily gatorade and salt intake due to recent low sodium readings. He denies weakness, dizziness, confusion, headaches, vision changes, chest pain, shortness of breath. He does have chronic edema due to varicose veins.  BP Readings from Last 3 Encounters:  05/13/17 138/78  04/15/17 (!) 158/84  04/11/17 (!) 143/78    Cholesterol- maintained on atorvastatin. Reports daily medication compliance without adverse medication effects including body aches, myalgias. He walks several miles daily for exercise but has not been walking as much over the past few weeks due to the cold weather.  Lab Results  Component Value Date   CHOL 126 03/22/2017   HDL 58.30  03/22/2017   LDLCALC 57 03/22/2017   TRIG 53.0 03/22/2017   CHOLHDL 2 03/22/2017    Patient Active Problem List   Diagnosis Date Noted  . 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 Anemia 03/22/2017  . Essential hypertension 02/29/2016  . Mixed hyperlipidemia 02/29/2016    Past Surgical History:  Procedure Laterality Date  . CHOLECYSTECTOMY    . Ulcer surgery      Family History  Problem Relation Age of Onset  . Hypertension Father   . Alzheimer's disease Father     Social History   Socioeconomic History  . Marital status: Married    Spouse name: Not on file  . Number of children: 2  . Years of education: 35  . Highest education level: Not on file  Social Needs  . Financial resource strain: Not on file  . Food insecurity - worry: Not on file  . Food insecurity - inability: Not on file  . Transportation needs - medical: Not on file  . Transportation needs - non-medical: Not on file  Occupational History  . Occupation: Retired  Tobacco Use  . Smoking status: Never Smoker  . Smokeless tobacco: Never Used  Substance and Sexual Activity  . Alcohol use: Yes    Alcohol/week: 1.2 oz    Types: 2 Glasses of wine per week  . Drug use: No  . Sexual activity: Not on file  Other Topics Concern  . Not  on file  Social History Narrative   Fun: Active in the [place].   Denies abuse and feels safe at home.    Daughter has had a stroke and is limited walking.      Current Outpatient Medications:  .  aspirin EC 81 MG tablet, Take 81 mg by mouth daily., Disp: , Rfl:  .  atorvastatin (LIPITOR) 40 MG tablet, Take 1 tablet (40 mg total) by mouth daily., Disp: 90 tablet, Rfl: 2 .  lisinopril (PRINIVIL,ZESTRIL) 10 MG tablet, Take 1 tablet (10 mg total) by mouth 2 (two) times daily., Disp: 180 tablet, Rfl: 2 .  Multiple Vitamin (MULTI-VITAMIN DAILY PO), Take by mouth., Disp: , Rfl:  .  Omega-3 1400 MG CAPS, Take 1,600 mg by mouth.,  Disp: , Rfl:   No Known Allergies   ROS See HPI  Objective  Vitals:   05/13/17 1021  BP: 138/78  Pulse: (!) 43  Resp: 16  Temp: 98 F (36.7 C)  TempSrc: Oral  SpO2: 98%  Weight: 186 lb 12.8 oz (84.7 kg)  Height: 5\' 11"  (1.803 m)  Heart rate stable- recent cardiology evaluation-03/28/17- for bradycardia  Body mass index is 26.05 kg/m.  Physical Exam  Vital signs reviewed. Constitutional: He is oriented to person, place, and time. He appears well-developed and well-nourished. He does not appear ill. No distress.  Appears stated age.  HENT:  Head: Normocephalic and atraumatic.  Cardiovascular: Regular rhythm, normal heart sounds and intact distal pulses.  Slow heart rate. BLE with mild nonpitting edema.  Pulmonary/Chest: Effort normal and breath sounds normal. No respiratory distress.  Musculoskeletal:  Musculoskeletal: Normal range of motion, no joint effusions. No gross deformities Neurological: He is alert and oriented to person, place, and time. No cranial nerve deficit. Coordination, balance, strength, speech and gait are normal. Skin: Skin is warm and dry.  Psychiatric: He has a normal mood and affect. Judgment and thought content normal.    PHQ2/9: Depression screen PHQ 2/9 03/22/2017  Decreased Interest 0  Down, Depressed, Hopeless 0  PHQ - 2 Score 0    Fall Risk: Fall Risk  03/22/2017  Falls in the past year? No    Assessment & Plan RTC in about 6 months for follow up of chronic conditions, or sooner if needed

## 2017-07-31 ENCOUNTER — Telehealth: Payer: Self-pay | Admitting: Nurse Practitioner

## 2017-07-31 NOTE — Telephone Encounter (Signed)
See Loris note attached- Can you please call Travis Craig and explain that the referral to nephrology is to help [place] find out why his sodium level keeps running low. Low sodium can cause many problems in the body and I would recommend he schedule the follow up with nephrology, they can help [place] further investigate his low sodium levels.

## 2017-07-31 NOTE — Telephone Encounter (Signed)
Spoke with pt in regards to this yesterday. Explained to pt why the referral is needed pt understood and I advised him that we would get nephrology to call him back.

## 2017-07-31 NOTE — Telephone Encounter (Signed)
-----   Message from Octavio Manns sent at 07/29/2017  9:22 AM EDT ----- Travis Craig called to schedule his nephrology appt and he stated ""I do not think I need your services" . They were concerned he may have not understood what the call is. Can you please call him and let him know why he needs to go. If he agrees to go, he can call them back at [phone]. Or if he prefers I can call them and have them call him back.

## 2017-08-20 DIAGNOSIS — I10 Essential (primary) hypertension: Secondary | ICD-10-CM | POA: Diagnosis not present

## 2017-08-20 DIAGNOSIS — E871 Hypo-osmolality and hyponatremia: Secondary | ICD-10-CM | POA: Diagnosis not present

## 2017-08-21 ENCOUNTER — Encounter (HOSPITAL_COMMUNITY): Payer: Self-pay

## 2017-08-21 ENCOUNTER — Emergency Department (HOSPITAL_COMMUNITY)
Admission: EM | Admit: 2017-08-21 | Discharge: 2017-08-21 | Disposition: A | Discharge status: Home / Self Care | Payer: Medicare Other | Attending: Emergency Medicine | Admitting: Emergency Medicine

## 2017-08-21 ENCOUNTER — Ambulatory Visit: Payer: Medicare Other | Admitting: Nurse Practitioner

## 2017-08-21 DIAGNOSIS — Z79899 Other long term (current) drug therapy: Secondary | ICD-10-CM | POA: Diagnosis not present

## 2017-08-21 DIAGNOSIS — E871 Hypo-osmolality and hyponatremia: Secondary | ICD-10-CM | POA: Diagnosis not present

## 2017-08-21 DIAGNOSIS — I10 Essential (primary) hypertension: Secondary | ICD-10-CM | POA: Insufficient documentation

## 2017-08-21 DIAGNOSIS — R899 Unspecified abnormal finding in specimens from other organs, systems and tissues: Secondary | ICD-10-CM | POA: Diagnosis present

## 2017-08-21 DIAGNOSIS — R799 Abnormal finding of blood chemistry, unspecified: Secondary | ICD-10-CM | POA: Diagnosis not present

## 2017-08-21 DIAGNOSIS — Z7982 Long term (current) use of aspirin: Secondary | ICD-10-CM | POA: Insufficient documentation

## 2017-08-21 LAB — COMPREHENSIVE METABOLIC PANEL
ALK PHOS: 66 U/L (ref 38–126)
ALT: 26 U/L (ref 17–63)
ANION GAP: 8 (ref 5–15)
AST: 35 U/L (ref 15–41)
Albumin: 3.6 g/dL (ref 3.5–5.0)
BILIRUBIN TOTAL: 1.4 mg/dL — AB (ref 0.3–1.2)
BUN: 9 mg/dL (ref 6–20)
CALCIUM: 8.9 mg/dL (ref 8.9–10.3)
CO2: 26 mmol/L (ref 22–32)
CREATININE: 0.9 mg/dL (ref 0.61–1.24)
Chloride: 99 mmol/L — ABNORMAL LOW (ref 101–111)
GLUCOSE: 107 mg/dL — AB (ref 65–99)
POTASSIUM: 4 mmol/L (ref 3.5–5.1)
Sodium: 133 mmol/L — ABNORMAL LOW (ref 135–145)
Total Protein: 6.6 g/dL (ref 6.5–8.1)

## 2017-08-21 LAB — CBC
HCT: 38.4 % — ABNORMAL LOW (ref 39.0–52.0)
Hemoglobin: 13.2 g/dL (ref 13.0–17.0)
MCH: 33.8 pg (ref 26.0–34.0)
MCHC: 34.4 g/dL (ref 30.0–36.0)
MCV: 98.2 fL (ref 78.0–100.0)
PLATELETS: 156 10*3/uL (ref 150–400)
RBC: 3.91 MIL/uL — AB (ref 4.22–5.81)
RDW: 12.5 % (ref 11.5–15.5)
WBC: 4.1 10*3/uL (ref 4.0–10.5)

## 2017-08-21 NOTE — Discharge Instructions (Addendum)
As discussed, your evaluation today has been largely reassuring.  But, it is important that you monitor your condition carefully, and do not hesitate to return to the ED if you develop new, or concerning changes in your condition. ? ?Otherwise, please follow-up with your physician for appropriate ongoing care. ? ?

## 2017-08-21 NOTE — ED Provider Notes (Signed)
[facility] EMERGENCY DEPARTMENT Provider Note   CSN: [id number] Arrival date & time: 08/21/17  1010   History   Chief Complaint No chief complaint on file.   HPI Travis Craig is a 77 y.o. male with a past medical history of bradycardia, hypertension, hyperlipidemia presenting for an abnormal lab.  Patient reports he was at a nephrology appointment yesterday for hyponatremia work-up.  Several labs and urine studies were drawn and the patient was notified today that he should report to the emergency department for low sodium, told it was less than 120.  He denies any symptoms.  No confusion, headache, mental status changes, increased edema, shortness of breath, urinary changes.  His wife notes that he may have been more fatigued 2 days ago but that has since resolved.  He drinks plenty of fluids reporting 4-5 24 ounce diet Pepsi drinks and a few Gatorade's per day.  He walks regularly to stay active. He notes recent discovery of ventral hernia, no other recent sx, no med changes.   On chart review, he previously had hyponatremia which was attributed to hydrochlorothiazide which was discontinued with improvement in his sodium.  Last available labs within the system was 134 in January 2019.  Past Medical History:  Diagnosis Date  . Chicken pox   . Hyperlipidemia   . Hypertension   . Ulcer     Patient Active Problem List   Diagnosis Date Noted  . Hyponatremia 05/13/2017  . 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 Anemia 03/22/2017  . Essential hypertension 02/29/2016  . Mixed hyperlipidemia 02/29/2016    Past Surgical History:  Procedure Laterality Date  . CHOLECYSTECTOMY    . Ulcer surgery          Home Medications    Prior to Admission medications   Medication Sig Start Date End Date Taking? Authorizing Provider  aspirin EC 81 MG tablet Take 81 mg by mouth daily.   Yes [provider]    atorvastatin (LIPITOR) 40 MG tablet Take 1 tablet (40 mg total) by mouth daily. 05/13/17  Yes Lance Sell, NP  lisinopril (PRINIVIL,ZESTRIL) 10 MG tablet Take 1 tablet (10 mg total) by mouth 2 (two) times daily. 04/15/17  Yes Lance Sell, NP  Multiple Vitamin (MULTI-VITAMIN DAILY PO) Take by mouth.   Yes [provider]  Omega-3 1400 MG CAPS Take 1,600 mg by mouth.   Yes [provider]    Family History Family History  Problem Relation Age of Onset  . Hypertension Father   . Alzheimer's disease Father     Social History Social History   Tobacco Use  . Smoking status: Never Smoker  . Smokeless tobacco: Never Used  Substance Use Topics  . Alcohol use: Yes    Alcohol/week: 1.2 oz    Types: 2 Glasses of wine per week  . Drug use: No     Allergies   Patient has no known allergies.   Review of Systems Review of Systems  Constitutional: Negative for diaphoresis and fever.  Respiratory: Negative for shortness of breath.   Cardiovascular: Negative for chest pain.  Gastrointestinal: Negative for nausea.  Endocrine: Negative for polyuria.  Genitourinary: Negative.   Neurological: Negative for dizziness, syncope and weakness.     Physical Exam Updated Vital Signs BP (!) 178/73   Pulse (!) 38   Temp (!) 97.4 F (36.3 C) (Oral)   Resp 12   SpO2 99%  General: Pleasant elderly gentleman resting on stretcher comfortably, no acute distress Head: Normocephalic, atraumatic Eyes: Normal conjunctiva, EOMI ENT: No JVD CV: Bradycardic, regular rhythm Resp: Clear breath sounds bilaterally, normal work of breathing, no distress  Abd: Soft, +BS, remote well healed midline incisional scar, no ventral hernia palpated with valsava at this time Extr: Superficial varicose veins with mild edema  Neuro: Alert and oriented x3  Skin: Warm, dry      ED Treatments / Results  Labs (all labs ordered are listed, but only abnormal results are  displayed) Labs Reviewed  COMPREHENSIVE METABOLIC PANEL - Abnormal; Notable for the following components:      Result Value   Sodium 133 (*)    Chloride 99 (*)    Glucose, Bld 107 (*)    Total Bilirubin 1.4 (*)    All other components within normal limits  CBC - Abnormal; Notable for the following components:   RBC 3.91 (*)    HCT 38.4 (*)    All other components within normal limits    EKG None  Radiology No results found.  Procedures Procedures (including critical care time)  Medications Ordered in ED Medications - No data to display   Initial Impression / Assessment and Plan / ED Course  I have reviewed the triage vital signs and the nursing notes.  Pertinent labs & imaging results that were available during my care of the patient were reviewed by me and considered in my medical decision making (see chart for details).  Patient presenting at advice of nephrologist after labs at office visit revealed sodium less than 120.  Repeat labs here showed sodium of 133, consistent with his prior values within our system following cessation of hydrochlorothiazide.  No symptoms concerning for hyponatremia reported and patient otherwise feels well.  Patient is safe for discharge with follow-up of prior work-up at nephrologist office for his hyponatremia.  Blood pressure elevated, patient asymptomatic without signs of endorgan damage, patient currently on lisinopril 10 mg 2 times daily per available medication list.  This can likely be uptitrated as an outpatient for improvement in his blood pressures.  Final Clinical Impressions(s) / ED Diagnoses   Final diagnoses:  Abnormal laboratory test result  Hyponatremia    ED Discharge Orders    None       Tawny Asal, MD 08/21/17 1421    Carmin Muskrat, MD 08/21/17 2207

## 2017-08-21 NOTE — ED Triage Notes (Signed)
Patient was called by MD and told to come to ED for low sodium. Patient reports hx of same. Alert and oriented, NAD

## 2017-08-21 NOTE — ED Notes (Signed)
Pt reports hx of bradycardia- 40's

## 2017-08-21 NOTE — ED Notes (Signed)
RN called [facility] and discussed erroneous lab result per their report of 120 or less. Imogene Burn, staff, there reports they have discussed with [facility] and they are having issues with machine.

## 2017-08-21 NOTE — ED Notes (Signed)
Urine specimen collected from Pt; in room on sink (no order).

## 2017-09-05 ENCOUNTER — Ambulatory Visit (INDEPENDENT_AMBULATORY_CARE_PROVIDER_SITE_OTHER): Payer: Medicare Other | Admitting: Nurse Practitioner

## 2017-09-05 ENCOUNTER — Encounter: Payer: Self-pay | Admitting: Nurse Practitioner

## 2017-09-05 ENCOUNTER — Ambulatory Visit (INDEPENDENT_AMBULATORY_CARE_PROVIDER_SITE_OTHER)
Admission: RE | Admit: 2017-09-05 | Discharge: 2017-09-05 | Disposition: A | Discharge status: Home / Self Care | Payer: Medicare Other | Source: Ambulatory Visit | Attending: Nurse Practitioner | Admitting: Nurse Practitioner

## 2017-09-05 ENCOUNTER — Ambulatory Visit (INDEPENDENT_AMBULATORY_CARE_PROVIDER_SITE_OTHER): Payer: Medicare Other | Admitting: *Deleted

## 2017-09-05 ENCOUNTER — Other Ambulatory Visit: Payer: Self-pay | Admitting: Family

## 2017-09-05 VITALS — BP 124/78 | HR 49 | Temp 97.6°F | Resp 16 | Ht 71.0 in | Wt 181.0 lb

## 2017-09-05 VITALS — BP 124/78 | HR 49 | Resp 18 | Ht 71.0 in | Wt 181.0 lb

## 2017-09-05 DIAGNOSIS — R413 Other amnesia: Secondary | ICD-10-CM

## 2017-09-05 DIAGNOSIS — Z23 Encounter for immunization: Secondary | ICD-10-CM | POA: Diagnosis not present

## 2017-09-05 DIAGNOSIS — R062 Wheezing: Secondary | ICD-10-CM

## 2017-09-05 DIAGNOSIS — Z0000 Encounter for general adult medical examination without abnormal findings: Secondary | ICD-10-CM

## 2017-09-05 DIAGNOSIS — I10 Essential (primary) hypertension: Secondary | ICD-10-CM

## 2017-09-05 DIAGNOSIS — E782 Mixed hyperlipidemia: Secondary | ICD-10-CM

## 2017-09-05 DIAGNOSIS — K4091 Unilateral inguinal hernia, without obstruction or gangrene, recurrent: Secondary | ICD-10-CM

## 2017-09-05 DIAGNOSIS — R05 Cough: Secondary | ICD-10-CM | POA: Diagnosis not present

## 2017-09-05 LAB — POCT EXHALED NITRIC OXIDE: FeNO level (ppb): 11

## 2017-09-05 MED ORDER — ATORVASTATIN CALCIUM 40 MG PO TABS: 40.0000 mg | ORAL_TABLET | Freq: Every day | ORAL | 2 refills | Status: DC

## 2017-09-05 MED ORDER — FLUTICASONE PROPIONATE 50 MCG/ACT NA SUSP: 2.0000 | Freq: Every day | NASAL | 1 refills | Status: DC

## 2017-09-05 MED ORDER — LISINOPRIL 10 MG PO TABS: 10.0000 mg | ORAL_TABLET | Freq: Two times a day (BID) | ORAL | 2 refills | Status: DC

## 2017-09-05 NOTE — Progress Notes (Signed)
Subjective:   Travis Craig is a 77 y.o. male who presents for an Initial Medicare Annual Wellness Visit.  Patient and wife voiced concerned regarding patient's worsening memory loss and explained that patient does have a strong family history of Alzheimer's. During AWV patient had difficulty responding to some questions,he was unable to recall or remember certain words or events when he responded. However, he did score well on the MMSE 29.  Review of Systems  No ROS.  Medicare Wellness Visit. Additional risk factors are reflected in the social history. Cardiac Risk Factors include: advanced age (>22men, >30 women);dyslipidemia;male gender;hypertension Sleep patterns: feels rested on waking, gets up 1 times nightly to void and sleeps 6-7 hours nightly. Wife states he takes naps during the day   Home Safety/Smoke Alarms: Feels safe in home. Smoke alarms in place.  Living environment; residence and Firearm Safety: apartment, no firearms., Lives with wife at independent living [facility], no needs for DME, good support system Seat Belt Safety/Bike Helmet: Wears seat belt   Objective:    Today's Vitals   09/05/17 1322 09/05/17 1324  BP: 124/78   Pulse: (!) 49   Resp: 18   SpO2: 97%   Weight: 181 lb (82.1 kg)   Height: 5\' 11"  (1.803 m)   PainSc:  0-No pain   Body mass index is 25.24 kg/m.  Advanced Directives 09/05/2017 04/11/2017  Does Patient Have a Medical Advance Directive? Yes No  Type of Paramedic of [place];Living will -  Copy of [place] in Chart? No - copy requested -  Would patient like information on creating a medical advance directive? - No - Patient declined    Current Medications (verified) Outpatient Encounter Medications as of 09/05/2017  Medication Sig  . aspirin EC 81 MG tablet Take 81 mg by mouth daily.  Marland Kitchen atorvastatin (LIPITOR) 40 MG tablet Take 1 tablet (40 mg total) by mouth daily.  . Coenzyme Q10  (COQ10 PO) Take by mouth.  . fluticasone (FLONASE) 50 MCG/ACT nasal spray Place 2 sprays into both nostrils daily.  Marland Kitchen lisinopril (PRINIVIL,ZESTRIL) 10 MG tablet Take 1 tablet (10 mg total) by mouth 2 (two) times daily.  . Multiple Vitamin (MULTI-VITAMIN DAILY PO) Take by mouth.  . Omega-3 1400 MG CAPS Take 1,600 mg by mouth.  . [DISCONTINUED] atorvastatin (LIPITOR) 40 MG tablet Take 1 tablet (40 mg total) by mouth daily.  . [DISCONTINUED] lisinopril (PRINIVIL,ZESTRIL) 10 MG tablet Take 1 tablet (10 mg total) by mouth 2 (two) times daily.   No facility-administered encounter medications on file as of 09/05/2017.     Allergies (verified) Patient has no known allergies.   History: Past Medical History:  Diagnosis Date  . Chicken pox   . Hyperlipidemia   . Hypertension   . Ulcer    Past Surgical History:  Procedure Laterality Date  . CHOLECYSTECTOMY    . Ulcer surgery     Family History  Problem Relation Age of Onset  . Hypertension Father   . Alzheimer's disease Father    Social History   Socioeconomic History  . Marital status: Married    Spouse name: Not on file  . Number of children: 2  . Years of education: 90  . Highest education level: Not on file  Occupational History  . Occupation: Retired  Scientific laboratory technician  . Financial resource strain: Not hard at all  . Food insecurity:    Worry: Never true    Inability: Never true  .  Transportation needs:    Medical: No    Non-medical: No  Tobacco Use  . Smoking status: Never Smoker  . Smokeless tobacco: Never Used  Substance and Sexual Activity  . Alcohol use: Yes    Alcohol/week: 1.2 oz    Types: 2 Glasses of Ivyanna Sibert per week  . Drug use: No  . Sexual activity: Not Currently  Lifestyle  . Physical activity:    Days per week: 7 days    Minutes per session: 100 min  . Stress: Not at all  Relationships  . Social connections:    Talks on phone: More than three times a week    Gets together: More than three times a week     Attends religious service: 1 to 4 times per year    Active member of club or organization: No    Attends meetings of clubs or organizations: Never    Relationship status: Married  Other Topics Concern  . Not on file  Social History Narrative   Fun: Active in the [place].   Denies abuse and feels safe at home.    Daughter has had a stroke and is limited walking.    Tobacco Counseling Counseling given: Not Answered  Activities of Daily Living In your present state of health, do you have any difficulty performing the following activities: 09/05/2017 04/10/2017  Hearing? Tempie Donning  Vision? N N  Difficulty concentrating or making decisions? Y N  Walking or climbing stairs? N N  Dressing or bathing? N N  Doing errands, shopping? Y N  Preparing Food and eating ? N -  Using the Toilet? N -  In the past six months, have you accidently leaked urine? N -  Do you have problems with loss of bowel control? N -  Managing your Medications? Y -  Managing your Finances? Y -  Housekeeping or managing your Housekeeping? N -  Some recent data might be hidden     Immunizations and Health Maintenance Immunization History  Administered Date(s) Administered  . Pneumococcal Polysaccharide-23 09/05/2017   There are no preventive care reminders to display for this patient.  Patient Care Team: Lance Sell, NP as PCP - General (Nurse Practitioner)  Indicate any recent Medical Services you may have received from other than [facility] providers in the past year (date may be approximate).    Assessment:   This is a routine wellness examination for Travis Craig. Physical assessment deferred to PCP.   Hearing/Vision screen Hearing Screening Comments: HOH, has hearing aids Vision Screening Comments: appointment yearly   Dietary issues and exercise activities discussed: Current Exercise Habits: Home exercise routine, Type of exercise: walking, Time (Minutes): 60, Frequency (Times/Week): 7, Weekly  Exercise (Minutes/Week): 420, Intensity: Mild, Exercise limited by: None identified  Diet (meal preparation, eat out, water intake, caffeinated beverages, dairy products, fruits and vegetables): in general, a "healthy" diet  , on average, 1-2  meals per day , reports poor appetite.  Reviewed heart healthy and diabetic diet, encouraged patient to increase daily water intake, drink nutritional supplements, and to try to not skip meals.   Goals    . Patient Stated     Stay as healthy and as independent as possible.      Depression Screen PHQ 2/9 Scores 09/05/2017 03/22/2017  PHQ - 2 Score 1 0    Fall Risk Fall Risk  09/05/2017 03/22/2017  Falls in the past year? Yes No  Number falls in past yr: 1 -  Injury with  Fall? No -  Risk for fall due to : (No Data) -  Risk for fall due to: Comment dicussed gettng a bed rail -  Follow up Falls prevention discussed -  Comment steady on his feet but has fallen out of the bed during bad dreams -   Cognitive Function: MMSE - Mini Mental State Exam 09/05/2017  Orientation to time 5  Orientation to Place 5  Registration 3  Attention/ Calculation 5  Recall 2  Language- name 2 objects 2  Language- repeat 1  Language- follow 3 step command 3  Language- read & follow direction 1  Write a sentence 1  Copy design 1  Total score 29        Screening Tests Health Maintenance  Topic Date Due  . INFLUENZA VACCINE  11/14/2017  . TETANUS/TDAP  01/19/2024  . PNA vac Low Risk Adult  Completed        Plan:     Continue doing brain stimulating activities (puzzles, reading, adult coloring books, staying active) to keep memory sharp.   Continue to eat heart healthy diet (full of fruits, vegetables, whole grains, lean protein, water--limit salt, fat, and sugar intake) and increase physical activity as tolerated.  I have personally reviewed and noted the following in the patient's chart:   . Medical and social history . Use of alcohol, tobacco or  illicit drugs  . Current medications and supplements . Functional ability and status . Nutritional status . Physical activity . Advanced directives . List of other physicians . Vitals . Screenings to include cognitive, depression, and falls . Referrals and appointments  In addition, I have reviewed and discussed with patient certain preventive protocols, quality metrics, and best practice recommendations. A written personalized care plan for preventive services as well as general preventive health recommendations were provided to patient.     Michiel Cowboy, RN   09/05/2017

## 2017-09-05 NOTE — Progress Notes (Signed)
Medical screening examination/treatment/procedure(s) were performed by the Wellness Coach, RN. As primary care provider I was immediately available for consulation/collaboration. I agree with above documentation. Mahima Hottle, NP  

## 2017-09-05 NOTE — Progress Notes (Signed)
Name: Travis Craig   MRN: [id number]    DOB: July 25, 1940   Date:09/05/2017       Progress Note  Subjective  Chief Complaint  Chief Complaint  Patient presents with  . Hernia    hernia x3 weeks, wheezing, talk about testing for alzhiemers    HPI Travis Craig is here today for a routine follow up. He is accompanied by his wife, who would like to discussed several issues she is concerned about. Travis Craig and his wife live together in independent living apartment, both remain independent and active. His wife says she is concerned about his memory and wheezing. He would like to discuss a hernia. We will review HTN and HLD today as well.  Memory loss- This is not a new problem, his wife has noticed he has difficulty remembering things, which has been ongoing for some time but noticeably worse over the past few months. His wife says he seems to forget normal things, like how to screw in a light bulb. He also suddenly stopped playing golf, which he loved, and aimlessly walks 8-10 miles a day because he "feels bored." he has not wandered off or gotten lost. He admits he does not eat a great diet, but feels this is because he does not really like the food options at his living facility, where meals are prepared for them. He denies fevers, headaches, syncope, vision changes, appetite changes, insomnia, depression. Both of his parents had alzheimers, and his father  passed away in his 49s from alzheimer's complications  Wheezing- Wife says she has heard him wheezing for 3-4 months, although he says he does not notice the wheezing and denies SOB. He does tell me that he was told he has a "spot" on his lungs years ago, was not given any follow up for this. He reports nasal congestion, sneezing, dry cough, occassional heartburn He is not taking anything for allergies or heartburn currently. Has never been a smoker.  Hernia- This is a recurrent problem Past history of right inguinal hernia  which has been more bothersome recently- intermittently visible/palpable to him and feeling a "pulling sensation" in his groin due to the hernia. He denies nausea, vomiting, pelvic pain, abdominal pain, dysuria, urinary frequency.  Hypertension -maintained on lisinopril 10 BID Reports daily medication compliance without adverse medication effects. No home BP readings reported, but does routinely have BP checked by nurse at his living facility Denies headaches, vision changes, chest pain.  BP Readings from Last 3 Encounters:  09/05/17 124/78  09/05/17 124/78  08/21/17 (!) 178/73   Cholesterol- maintained on atorvastatin 40 daily Reports daily medication compliance without noted adverse medication effects including myalgias.  Lab Results  Component Value Date   CHOL 126 03/22/2017   HDL 58.30 03/22/2017   LDLCALC 57 03/22/2017   TRIG 53.0 03/22/2017   CHOLHDL 2 03/22/2017     Patient Active Problem List   Diagnosis Date Noted  . Hyponatremia 05/13/2017  . 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 Anemia 03/22/2017  . Essential hypertension 02/29/2016  . Mixed hyperlipidemia 02/29/2016    Past Surgical History:  Procedure Laterality Date  . CHOLECYSTECTOMY    . Ulcer surgery      Family History  Problem Relation Age of Onset  . Hypertension Father   . Alzheimer's disease Father     Social History   Socioeconomic History  . Marital status: Married    Spouse name: Not on file  .  Number of children: 2  . Years of education: 73  . Highest education level: Not on file  Occupational History  . Occupation: Retired  Scientific laboratory technician  . Financial resource strain: Not on file  . Food insecurity:    Worry: Not on file    Inability: Not on file  . Transportation needs:    Medical: Not on file    Non-medical: Not on file  Tobacco Use  . Smoking status: Never Smoker  . Smokeless tobacco: Never Used  Substance and Sexual Activity   . Alcohol use: Yes    Alcohol/week: 1.2 oz    Types: 2 Glasses of wine per week  . Drug use: No  . Sexual activity: Not on file  Lifestyle  . Physical activity:    Days per week: Not on file    Minutes per session: Not on file  . Stress: Not on file  Relationships  . Social connections:    Talks on phone: Not on file    Gets together: Not on file    Attends religious service: Not on file    Active member of club or organization: Not on file    Attends meetings of clubs or organizations: Not on file    Relationship status: Not on file  . Intimate partner violence:    Fear of current or ex partner: Not on file    Emotionally abused: Not on file    Physically abused: Not on file    Forced sexual activity: Not on file  Other Topics Concern  . Not on file  Social History Narrative   Fun: Active in the [place].   Denies abuse and feels safe at home.    Daughter has had a stroke and is limited walking.      Current Outpatient Medications:  .  aspirin EC 81 MG tablet, Take 81 mg by mouth daily., Disp: , Rfl:  .  atorvastatin (LIPITOR) 40 MG tablet, Take 1 tablet (40 mg total) by mouth daily., Disp: 90 tablet, Rfl: 2 .  Coenzyme Q10 (COQ10 PO), Take by mouth., Disp: , Rfl:  .  lisinopril (PRINIVIL,ZESTRIL) 10 MG tablet, Take 1 tablet (10 mg total) by mouth 2 (two) times daily., Disp: 180 tablet, Rfl: 2 .  Multiple Vitamin (MULTI-VITAMIN DAILY PO), Take by mouth., Disp: , Rfl:  .  Omega-3 1400 MG CAPS, Take 1,600 mg by mouth., Disp: , Rfl:   No Known Allergies   ROS See HPI  Objective  Vitals:   09/05/17 1001  BP: 124/78  Pulse: (!) 49  Resp: 16  Temp: 97.6 F (36.4 C)  TempSrc: Oral  SpO2: 97%  Weight: 181 lb (82.1 kg)  Height: 5\' 11"  (1.803 m)  heart rate stable  Body mass index is 25.24 kg/m.  Physical Exam Vital signs reviewed. Constitutional: He isoriented to person, place, and time. He appearswell-developedand well-nourished.  No  distress. Appears stated age. HENT:  Head:Normocephalicand atraumatic.  Cardiovascular:Regular rhythm,normal heart soundsand intact distal pulses.Slow heart rate. BLE with mild nonpitting edema. Pulmonary/Chest:Effort normaland breath sounds normal. Norespiratory distress. No wheezes, rhonchi, or rales.  Abdominal: Soft. Bowel sounds are normal, no distension.  Genitourinary: Right inguinal hernia. Musculoskeletal: Normal range of motion, No gross deformities Neurological: He is alert and oriented to person, place, and time. No cranial nerve deficit. Coordination, balance, strength, speech and gait are normal. Skin: Skin iswarmand dry.  Psychiatric: He has anormal mood and affect.Judgmentand thought contentnormal.    Assessment & Plan RTC  in 1 month for F/U: wheezing- started flonase and OTC antihistamine today, consider GERD treatment if continued heartburn and no improvement in symptoms; Memory changes- wellness with Sharee Pimple for MMSE  Memory changes He is schedule for AWV today and will have MMSE  F/U TBD after MMSE  Wheezing FENO could indicate allergies or GERD, seems to have more allergy related symptoms so we will initiate treatment for allergies with flonase and OTC antihistamine today- dosing and side effects discussed  Update CXR RTC in 1 month for F/U- will add PPI if continued heartburn - DG Chest 2 View; Future - POCT EXHALED NITRIC OXIDE-FENO result 11 - fluticasone (FLONASE) 50 MCG/ACT nasal spray; Place 2 sprays into both nostrils daily.  Dispense: 16 g; Refill: 1  Unilateral recurrent inguinal hernia without obstruction or gangrene Discussed referral to surgery for further evaluation and management and he is agreeable - Ambulatory referral to General Surgery

## 2017-09-05 NOTE — Patient Instructions (Addendum)
Please head downstairs for XRAY. If any of your test results are critically abnormal, you will be contacted right away. Your results may be released to your MyChart for viewing before I am able to provide you with my response. I will contact you within a week about your test results and any recommendations for abnormalities.  I have sent a prescription for flonase nasal spray to your pharmacy- you may start with 2 sprays in each nostril daily then reduce to 1 spray in each nostril daily when your symptoms improve Please also start a once daily over the counter allergy medication such as claritin or zyrtec for your symptoms.  If you have medicare related insurance (such as traditional Medicare, [employer], [employer], or similar), Please make an appointment at the scheduling desk with Sharee Pimple, the [employer], for your Wellness visit in this office, which is a benefit with your insurance.  I have placed a referral to surgery. Our office will call you to schedule this appointment. You should hear from our office in 7-10 days.  I would like for you to come back in 1 month for Follow up

## 2017-09-05 NOTE — Patient Instructions (Addendum)
Continue doing brain stimulating activities (puzzles, reading, adult coloring books, staying active) to keep memory sharp.   Continue to eat heart healthy diet (full of fruits, vegetables, whole grains, lean protein, water--limit salt, fat, and sugar intake) and increase physical activity as tolerated.   Travis Craig , Thank you for taking time to come for your Medicare Wellness Visit. I appreciate your ongoing commitment to your health goals. Please review the following plan we discussed and let me know if I can assist you in the future.   These are the goals we discussed: Goals    . Patient Stated     Stay as healthy and as independent as possible.       This is a list of the screening recommended for you and due dates:  Health Maintenance  Topic Date Due  . Pneumonia vaccines (2 of 2 - PPSV23) 01/19/2016  . Flu Shot  11/14/2017  . Tetanus Vaccine  01/19/2024

## 2017-09-06 ENCOUNTER — Encounter: Payer: Self-pay | Admitting: Neurology

## 2017-10-07 ENCOUNTER — Encounter: Payer: Self-pay | Admitting: Nurse Practitioner

## 2017-10-07 NOTE — Assessment & Plan Note (Signed)
Stable Continue lisinopril at current dosage - lisinopril (PRINIVIL,ZESTRIL) 10 MG tablet; Take 1 tablet (10 mg total) by mouth 2 (two) times daily.  Dispense: 180 tablet; Refill: 2

## 2017-10-07 NOTE — Assessment & Plan Note (Signed)
Stable Continue atorvastatin at current dosage - atorvastatin (LIPITOR) 40 MG tablet; Take 1 tablet (40 mg total) by mouth daily.  Dispense: 90 tablet; Refill: 2

## 2017-11-19 ENCOUNTER — Ambulatory Visit (INDEPENDENT_AMBULATORY_CARE_PROVIDER_SITE_OTHER): Payer: Medicare Other | Admitting: Neurology

## 2017-11-19 ENCOUNTER — Encounter: Payer: Self-pay | Admitting: Neurology

## 2017-11-19 ENCOUNTER — Encounter

## 2017-11-19 ENCOUNTER — Other Ambulatory Visit: Payer: Self-pay

## 2017-11-19 VITALS — BP 120/78 | HR 49 | Ht 71.0 in | Wt 184.0 lb

## 2017-11-19 DIAGNOSIS — G4752 REM sleep behavior disorder: Secondary | ICD-10-CM | POA: Diagnosis not present

## 2017-11-19 DIAGNOSIS — R413 Other amnesia: Secondary | ICD-10-CM

## 2017-11-19 DIAGNOSIS — G3184 Mild cognitive impairment, so stated: Secondary | ICD-10-CM | POA: Diagnosis not present

## 2017-11-19 NOTE — Progress Notes (Signed)
NEUROLOGY CONSULTATION NOTE  Travis Craig MRN: [id number] DOB: 1941/03/11  Referring provider: Jodi Mourning, FNP Primary care provider: Caesar Chestnut, NP  Reason for consult:  Memory loss  Thank you for your kind referral of Travis Craig for consultation of the above symptoms. Although his history is well known to you, please allow me to reiterate it for the purpose of our medical record. The patient was accompanied to the clinic by his wife who also provides collateral information. Records and images were personally reviewed where available.  HISTORY OF PRESENT ILLNESS: This is a pleasant 77 year old right-handed man with a history of hypertension, hyperlipidemia, presenting for evaluation of worsening memory. His wife is also very concerned about significant movements in sleep where he would sometimes hit her or fall out of bed. He feels his memory is "slipping some but not that bad." His wife of 14 years started noticing gradual memory changes for a few years, but more noticeable when they moved to independent living 3 months ago, the day they moved, he got lost in the building, he could not figure out how to do laundry on their quilt, which he previously did fine with, he did not know where to put soap in the dishwasher. His wife felt this was related to the stress of the move, this is better now. He misplaces his glasses frequently. He occasionally repeats himself, but his wife feels his hearing also needs to be checked. He continues to manage bills without difficulties, he missed one payment in the past year. He drives minimally without getting lost. He manages his own pillbox and may take it later in the day but usually takes his medication daily. 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 He does not remember his dreams, except for one dream 5 years ago where he was being chased by an alligator, he got out of bed and ran into the window, tore up the blinds. He had a bruise on his head. He has not had any sleep studies done. When she touches him, he seems to quiet down. His wife reports it almost looks like PTSD, but he was never in combat. His wife denies any personality changes, no paranoia or hallucinations. MMSE at PCP office in May 2019 was 29/30.   He has occasional hand tremors, L>R. He has noticed a decreased sense of smell. 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 He denies any headaches, dizziness, diplopia, dysarthria/dysphagia, neck/back pain, focal numbness/tingling/weakness, incontinence. No falls. His father had Alzheimer's dementia, his mother and both paternal grandparents had dementia, they report his paternal grandmother was institutionalized at age 37. He denies any significant head injuries, he does not drink alcohol.    PAST MEDICAL HISTORY: Past Medical History:  Diagnosis Date  . Chicken pox   . Hyperlipidemia   . Hypertension   . Ulcer     PAST SURGICAL HISTORY: Past Surgical History:  Procedure Laterality Date  . CHOLECYSTECTOMY    . Ulcer surgery      MEDICATIONS: Current Outpatient Medications on File Prior to Visit  Medication Sig Dispense Refill  . aspirin EC 81 MG tablet Take 81 mg by mouth daily.    Travis Craig Kitchen atorvastatin (  LIPITOR) 40 MG tablet Take 1 tablet (40 mg total) by mouth daily. 90 tablet 2  . Coenzyme Q10 (COQ10 PO) Take by mouth.    . fluticasone (FLONASE) 50 MCG/ACT nasal spray Place 2 sprays into both nostrils daily. 16 g 1  . lisinopril (PRINIVIL,ZESTRIL) 10 MG tablet Take 1 tablet (10 mg total) by mouth 2 (two) times daily. 180 tablet 2  . Multiple Vitamin (MULTI-VITAMIN DAILY PO) Take by mouth.    . Omega-3 1400 MG CAPS Take 1,600 mg by mouth.      No current facility-administered medications on file prior to visit.     ALLERGIES: No Known Allergies  FAMILY HISTORY: Family History  Problem Relation Age of Onset  . Hypertension Father   . Alzheimer's disease Father     SOCIAL HISTORY: Social History   Socioeconomic History  . Marital status: Married    Spouse name: Not on file  . Number of children: 2  . Years of education: 45  . Highest education level: Not on file  Occupational History  . Occupation: Retired  Scientific laboratory technician  . Financial resource strain: Not hard at all  . Food insecurity:    Worry: Never true    Inability: Never true  . Transportation needs:    Medical: No    Non-medical: No  Tobacco Use  . Smoking status: Never Smoker  . Smokeless tobacco: Never Used  Substance and Sexual Activity  . Alcohol use: Yes    Alcohol/week: 1.2 oz    Types: 2 Glasses of wine per week  . Drug use: No  . Sexual activity: Not Currently  Lifestyle  . Physical activity:    Days per week: 7 days    Minutes per session: 100 min  . Stress: Not at all  Relationships  . Social connections:    Talks on phone: More than three times a week    Gets together: More than three times a week    Attends religious service: 1 to 4 times per year    Active member of club or organization: No    Attends meetings of clubs or organizations: Never    Relationship status: Married  . Intimate partner violence:    Fear of current or ex partner: No    Emotionally abused: No    Physically abused: No    Forced sexual activity: No  Other Topics Concern  . Not on file  Social History Narrative   Fun: Active in the [place].   Denies abuse and feels safe at home.    Daughter has had a stroke and is limited walking.     REVIEW OF SYSTEMS: Constitutional: No fevers, chills, or sweats, no generalized fatigue, change in appetite Eyes: No visual changes, double vision, eye pain Ear, nose and throat: No hearing loss, ear pain,  nasal congestion, sore throat Cardiovascular: No chest pain, palpitations Respiratory:  No shortness of breath at rest or with exertion, wheezes GastrointestinaI: No nausea, vomiting, diarrhea, abdominal pain, fecal incontinence Genitourinary:  No dysuria, urinary retention or frequency Musculoskeletal:  No neck pain, back pain Integumentary: No rash, pruritus, skin lesions Neurological: as above Psychiatric: No depression, insomnia, anxiety Endocrine: No palpitations, fatigue, diaphoresis, mood swings, change in appetite, change in weight, increased thirst Hematologic/Lymphatic:  No anemia, purpura, petechiae. Allergic/Immunologic: no itchy/runny eyes, nasal congestion, recent allergic reactions, rashes  PHYSICAL EXAM: Vitals:   11/19/17 1044  BP: 120/78  Pulse: (!) 49  SpO2: 92%   General: No acute  distress Head:  Normocephalic/atraumatic Eyes: Fundoscopic exam shows bilateral sharp discs, no vessel changes, exudates, or hemorrhages Neck: supple, no paraspinal tenderness, full range of motion Back: No paraspinal tenderness Heart: regular rate and rhythm Lungs: Clear to auscultation bilaterally. Vascular: No carotid bruits. Skin/Extremities: No rash, no edema Neurological Exam: Mental status: alert and oriented to person, place, and time, no dysarthria or aphasia, Fund of knowledge is appropriate.  Recent and remote memory are impaired.  Attention and concentration are normal.    Able to name objects and repeat phrases.  Montreal Cognitive Assessment  11/19/2017  Visuospatial/ Executive (0/5) 5  Naming (0/3) 2  Attention: Read list of digits (0/2) 2  Attention: Read list of letters (0/1) 1  Attention: Serial 7 subtraction starting at 100 (0/3) 3  Language: Repeat phrase (0/2) 2  Language : Fluency (0/1) 0  Abstraction (0/2) 2  Delayed Recall (0/5) 3  Orientation (0/6) 6  Total 26   Cranial nerves: CN I: not tested CN II: pupils equal, round and reactive to light, visual  fields intact, fundi unremarkable. CN III, IV, VI:  full range of motion, no nystagmus, no ptosis CN V: facial sensation intact CN VII: upper and lower face symmetric CN VIII: hearing intact to finger rub CN IX, X: gag intact, uvula midline CN XI: sternocleidomastoid and trapezius muscles intact CN XII: tongue midline Bulk & Tone: normal, no fasciculations, no cogwheeling Motor: 5/5 throughout with no pronator drift. Sensation: intact to light touch, cold, pin on both UE and LE, decreased vibration sense to left knee.  No extinction to double simultaneous stimulation.  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 His neurological exam is largely non-focal, MOCA score today 26/30. Symptoms suggestive of Mild Cognitive Impairment, he does not appear to have difficulties with complex tasks at this point. He does not have any parkinsonian signs on exam. We discussed different causes of memory loss. Check TSH and B12. MRI brain without contrast will be ordered to assess for underlying structural abnormality. With symptoms concerning for REM behavior disorder, a sleep study will be ordered. They are agreeable to trying melatonin at bedtime. We agreed to hold off on cholinesterase inhibitors such as Aricept, side effects and expectations from the medication were discussed. We discussed the importance of control of vascular risk factors,  physical exercise, and brain stimulation exercises for brain health. He will follow-up in 6 months and knows to call for any changes.   Thank you for allowing me to participate in the care of this patient. Please do not hesitate to call for any questions or concerns.   Ellouise Newer, M.D.  CC: Caesar Chestnut, NP, Jodi Mourning, FNP

## 2017-11-19 NOTE — Patient Instructions (Addendum)
1. Schedule MRI brain without contrast  We have sent a referral to [facility] for your MRI and they will call you directly to schedule your appt. They are located at [place]. If you need to contact them directly please call [phone].   2. Schedule in-lab sleep study 3. Try melatonin 3mg  every night and see if this helps with the movements at night 4. Follow-up in 6 months or so, call for any changes  FALL PRECAUTIONS: Be cautious when walking. Scan the area for obstacles that may increase the risk of trips and falls. When getting up in the mornings, sit up at the edge of the bed for a few minutes before getting out of bed. Consider elevating the bed at the head end to avoid drop of blood pressure when getting up. Walk always in a well-lit room (use night lights in the walls). Avoid area rugs or power cords from appliances in the middle of the walkways. Use a walker or a cane if necessary and consider physical therapy for balance exercise. Get your eyesight checked regularly.  FINANCIAL OVERSIGHT: Supervision, especially oversight when making financial decisions or transactions is also recommended.  HOME SAFETY: Consider the safety of the kitchen when operating appliances like stoves, microwave oven, and blender. Consider having supervision and share cooking responsibilities until no longer able to participate in those. Accidents with firearms and other hazards in the house should be identified and addressed as well.  DRIVING: Regarding driving, in patients with progressive memory problems, driving will be impaired. We advise to have someone else do the driving if trouble finding directions or if minor accidents are reported. Independent driving assessment is available to determine safety of driving.  ABILITY TO BE LEFT ALONE: If patient is unable to contact 911 operator, consider using LifeLine, or when the need is there, arrange for someone to stay with patients. Smoking is a  fire hazard, consider supervision or cessation. Risk of wandering should be assessed by caregiver and if detected at any point, supervision and safe proof recommendations should be instituted.  MEDICATION SUPERVISION: Inability to self-administer medication needs to be constantly addressed. Implement a mechanism to ensure safe administration of the medications.  RECOMMENDATIONS FOR ALL PATIENTS WITH MEMORY PROBLEMS: 1. Continue to exercise (Recommend 30 minutes of walking everyday, or 3 hours every week) 2. Increase social interactions - continue going to [place] and enjoy social gatherings with friends and family 3. Eat healthy, avoid fried foods and eat more fruits and vegetables 4. Maintain adequate blood pressure, blood sugar, and blood cholesterol level. Reducing the risk of stroke and cardiovascular disease also helps promoting better memory. 5. Avoid stressful situations. Live a simple life and avoid aggravations. Organize your time and prepare for the next day in anticipation. 6. Sleep well, avoid any interruptions of sleep and avoid any distractions in the bedroom that may interfere with adequate sleep quality 7. Avoid sugar, avoid sweets as there is a strong link between excessive sugar intake, diabetes, and cognitive impairment The Mediterranean diet has been shown to help patients reduce the risk of progressive memory disorders and reduces cardiovascular risk. This includes eating fish, eat fruits and green leafy vegetables, nuts like almonds and hazelnuts, walnuts, and also use olive oil. Avoid fast foods and fried foods as much as possible. Avoid sweets and sugar as sugar use has been linked to worsening of memory function.

## 2017-12-06 ENCOUNTER — Ambulatory Visit
Admission: RE | Admit: 2017-12-06 | Discharge: 2017-12-06 | Disposition: A | Discharge status: Home / Self Care | Payer: Medicare Other | Source: Ambulatory Visit | Attending: Neurology | Admitting: Neurology

## 2017-12-06 DIAGNOSIS — R413 Other amnesia: Secondary | ICD-10-CM

## 2017-12-23 ENCOUNTER — Ambulatory Visit (HOSPITAL_BASED_OUTPATIENT_CLINIC_OR_DEPARTMENT_OTHER): Payer: Medicare Other | Attending: Neurology | Admitting: Internal Medicine

## 2017-12-23 DIAGNOSIS — G4752 REM sleep behavior disorder: Secondary | ICD-10-CM | POA: Diagnosis not present

## 2017-12-23 DIAGNOSIS — R413 Other amnesia: Secondary | ICD-10-CM

## 2017-12-23 DIAGNOSIS — G3184 Mild cognitive impairment, so stated: Secondary | ICD-10-CM | POA: Insufficient documentation

## 2018-01-04 DIAGNOSIS — G4752 REM sleep behavior disorder: Secondary | ICD-10-CM

## 2018-01-04 DIAGNOSIS — G3184 Mild cognitive impairment, so stated: Secondary | ICD-10-CM | POA: Diagnosis not present

## 2018-01-04 NOTE — Procedures (Signed)
   Patient Name: Travis Craig, Travis Craig Date: 12/23/2017 Gender: Male D.O.B: [birth year] Age (years): 77 Referring Provider: Cameron Sprang Height (inches): 28 Interpreting Physician: Baird Lyons MD, ABSM Weight (lbs): 185 RPSGT: Earney [place] BMI: 26 MRN: [id number] Neck Size: 16.00  CLINICAL INFORMATION Sleep Study Type: NPSG Indication for sleep study: REM Behavior Disorder  Epworth Sleepiness Score: 2  SLEEP STUDY TECHNIQUE As per the AASM Manual for the Scoring of Sleep and Associated Events v2.3 (April 2016) with a hypopnea requiring 4% desaturations.  The channels recorded and monitored were frontal, central and occipital EEG, electrooculogram (EOG), submentalis EMG (chin), nasal and oral airflow, thoracic and abdominal wall motion, anterior tibialis EMG, snore microphone, electrocardiogram, and pulse oximetry.  MEDICATIONS Medications self-administered by patient taken the night of the study : none reported  SLEEP ARCHITECTURE The study was initiated at 9:39:29 PM and ended at 4:32:43 AM.  Sleep onset time was 32.3 minutes and the sleep efficiency was 76.0%%. The total sleep time was 313.9 minutes.  Stage REM latency was 148.0 minutes.  The patient spent 1.1%% of the night in stage N1 sleep, 84.4%% in stage N2 sleep, 0.2%% in stage N3 and 14.3% in REM.  Alpha intrusion was absent.  Supine sleep was 48.11%.  RESPIRATORY PARAMETERS The overall apnea/hypopnea index (AHI) was 0.0 per hour. There were 0 total apneas, including 0 obstructive, 0 central and 0 mixed apneas. There were 0 hypopneas and 0 RERAs.  The AHI during Stage REM sleep was 0.0 per hour.  AHI while supine was 0.0 per hour.  The mean oxygen saturation was 92.5%. The minimum SpO2 during sleep was 89.0%.  soft snoring was noted during this study.  CARDIAC DATA The 2 lead EKG demonstrated sinus rhythm. The mean heart rate was 41.9 beats per minute. Other EKG findings include: None.  LEG  MOVEMENT DATA The total PLMS were 192 with a resulting PLMS index of 36.7/ hr. Associated arousal with leg movement index was 0.4/ hr.  .  IMPRESSIONS - No significant obstructive sleep apnea occurred during this study (AHI = 0.0/h). - No significant central sleep apnea occurred during this study (CAI = 0.0/h). - The patient had minimal or no oxygen desaturation during the study (Min O2 = 89.0%) - The patient snored with soft snoring volume. - No cardiac abnormalities were noted during this study. - Frequent limb movements were noted, with little associated arousal. Most events were during stage N2 sleep between 10:30 PM and 12:30 AM. A few episodes of leg-raising and arm movement were noted during REM, described by tech as "not very aggresive or active". REM time was reduced- only 14% ot total sleep time.  DIAGNOSIS - Periodic Limb Movement  RECOMMENDATIONS - This study does not rule out REM Behavior Disorder, if that is the clinical diagnosis as indicated by the patient. - Sleep hygiene should be reviewed to assess factors that may improve sleep quality. - Weight management and regular exercise should be initiated or continued if appropriate.  [Electronically signed] 01/04/2018 01:13 PM  Baird Lyons MD, [place], American Board of Sleep Medicine   NPI: [id number]                          [place], [facility]  ELECTRONICALLY SIGNED ON:  01/04/2018, 1:04 PM [place] PH: [phone]   FX: [phone] [place]

## 2018-02-11 DIAGNOSIS — Z23 Encounter for immunization: Secondary | ICD-10-CM | POA: Diagnosis not present

## 2018-03-12 ENCOUNTER — Other Ambulatory Visit: Payer: Self-pay | Admitting: Nurse Practitioner

## 2018-03-12 DIAGNOSIS — R062 Wheezing: Secondary | ICD-10-CM

## 2018-03-17 DIAGNOSIS — H04129 Dry eye syndrome of unspecified lacrimal gland: Secondary | ICD-10-CM | POA: Diagnosis not present

## 2018-06-10 DIAGNOSIS — H02834 Dermatochalasis of left upper eyelid: Secondary | ICD-10-CM | POA: Diagnosis not present

## 2018-06-10 DIAGNOSIS — H53483 Generalized contraction of visual field, bilateral: Secondary | ICD-10-CM | POA: Diagnosis not present

## 2018-06-10 DIAGNOSIS — H0279 Other degenerative disorders of eyelid and periocular area: Secondary | ICD-10-CM | POA: Diagnosis not present

## 2018-06-10 DIAGNOSIS — H02413 Mechanical ptosis of bilateral eyelids: Secondary | ICD-10-CM | POA: Diagnosis not present

## 2018-06-10 DIAGNOSIS — H57813 Brow ptosis, bilateral: Secondary | ICD-10-CM | POA: Diagnosis not present

## 2018-06-10 DIAGNOSIS — H02423 Myogenic ptosis of bilateral eyelids: Secondary | ICD-10-CM | POA: Diagnosis not present

## 2018-06-10 DIAGNOSIS — H02831 Dermatochalasis of right upper eyelid: Secondary | ICD-10-CM | POA: Diagnosis not present

## 2018-07-01 ENCOUNTER — Ambulatory Visit (INDEPENDENT_AMBULATORY_CARE_PROVIDER_SITE_OTHER): Payer: Medicare Other | Admitting: Neurology

## 2018-07-01 ENCOUNTER — Encounter: Payer: Self-pay | Admitting: Neurology

## 2018-07-01 ENCOUNTER — Other Ambulatory Visit: Payer: Self-pay

## 2018-07-01 VITALS — BP 110/70 | HR 50 | Temp 96.2°F | Ht 71.0 in | Wt 191.1 lb

## 2018-07-01 DIAGNOSIS — G4752 REM sleep behavior disorder: Secondary | ICD-10-CM

## 2018-07-01 DIAGNOSIS — G3184 Mild cognitive impairment, so stated: Secondary | ICD-10-CM

## 2018-07-01 MED ORDER — DONEPEZIL HCL 5 MG PO TABS: ORAL_TABLET | ORAL | 3 refills | Status: DC

## 2018-07-01 NOTE — Patient Instructions (Signed)
1. Start Donepezil 5mg : take 1/2 tablet daily for 2 weeks, then increase to 1 tablet daily  2. Call our office in 3 months, if no problems, we can start a medication to help with the sleep movements  3. Follow-up in 6 months or so, call for any changes  FALL PRECAUTIONS: Be cautious when walking. Scan the area for obstacles that may increase the risk of trips and falls. When getting up in the mornings, sit up at the edge of the bed for a few minutes before getting out of bed. Consider elevating the bed at the head end to avoid drop of blood pressure when getting up. Walk always in a well-lit room (use night lights in the walls). Avoid area rugs or power cords from appliances in the middle of the walkways. Use a walker or a cane if necessary and consider physical therapy for balance exercise. Get your eyesight checked regularly.  HOME SAFETY: Consider the safety of the kitchen when operating appliances like stoves, microwave oven, and blender. Consider having supervision and share cooking responsibilities until no longer able to participate in those. Accidents with firearms and other hazards in the house should be identified and addressed as well.  DRIVING: Regarding driving, in patients with progressive memory problems, driving will be impaired. We advise to have someone else do the driving if trouble finding directions or if minor accidents are reported. Independent driving assessment is available to determine safety of driving.  ABILITY TO BE LEFT ALONE: If patient is unable to contact 911 operator, consider using LifeLine, or when the need is there, arrange for someone to stay with patients. Smoking is a fire hazard, consider supervision or cessation. Risk of wandering should be assessed by caregiver and if detected at any point, supervision and safe proof recommendations should be instituted.  MEDICATION SUPERVISION: Inability to self-administer medication needs to be constantly addressed.  Implement a mechanism to ensure safe administration of the medications.  RECOMMENDATIONS FOR ALL PATIENTS WITH MEMORY PROBLEMS: 1. Continue to exercise (Recommend 30 minutes of walking everyday, or 3 hours every week) 2. Increase social interactions - continue going to [place] and enjoy social gatherings with friends and family 3. Eat healthy, avoid fried foods and eat more fruits and vegetables 4. Maintain adequate blood pressure, blood sugar, and blood cholesterol level. Reducing the risk of stroke and cardiovascular disease also helps promoting better memory. 5. Avoid stressful situations. Live a simple life and avoid aggravations. Organize your time and prepare for the next day in anticipation. 6. Sleep well, avoid any interruptions of sleep and avoid any distractions in the bedroom that may interfere with adequate sleep quality 7. Avoid sugar, avoid sweets as there is a strong link between excessive sugar intake, diabetes, and cognitive impairment The Mediterranean diet has been shown to help patients reduce the risk of progressive memory disorders and reduces cardiovascular risk. This includes eating fish, eat fruits and green leafy vegetables, nuts like almonds and hazelnuts, walnuts, and also use olive oil. Avoid fast foods and fried foods as much as possible. Avoid sweets and sugar as sugar use has been linked to worsening of memory function.  There is always a concern of gradual progression of memory problems. If this is the case, then we may need to adjust level of care according to patient needs. Support, both to the patient and caregiver, should then be put into place.
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
## 2018-07-28 ENCOUNTER — Other Ambulatory Visit: Payer: Self-pay | Admitting: *Deleted

## 2018-07-28 DIAGNOSIS — R062 Wheezing: Secondary | ICD-10-CM

## 2018-07-28 MED ORDER — FLUTICASONE PROPIONATE 50 MCG/ACT NA SUSP: 2.0000 | Freq: Every day | NASAL | 0 refills | Status: DC

## 2018-07-31 ENCOUNTER — Other Ambulatory Visit: Payer: Self-pay | Admitting: Internal Medicine

## 2018-07-31 DIAGNOSIS — E782 Mixed hyperlipidemia: Secondary | ICD-10-CM

## 2018-10-06 DIAGNOSIS — H02423 Myogenic ptosis of bilateral eyelids: Secondary | ICD-10-CM | POA: Diagnosis not present

## 2018-10-06 DIAGNOSIS — H0279 Other degenerative disorders of eyelid and periocular area: Secondary | ICD-10-CM | POA: Diagnosis not present

## 2018-10-06 DIAGNOSIS — H02413 Mechanical ptosis of bilateral eyelids: Secondary | ICD-10-CM | POA: Diagnosis not present

## 2018-10-06 DIAGNOSIS — H02834 Dermatochalasis of left upper eyelid: Secondary | ICD-10-CM | POA: Diagnosis not present

## 2018-10-06 DIAGNOSIS — H57813 Brow ptosis, bilateral: Secondary | ICD-10-CM | POA: Diagnosis not present

## 2018-10-06 DIAGNOSIS — H02831 Dermatochalasis of right upper eyelid: Secondary | ICD-10-CM | POA: Diagnosis not present

## 2018-11-22 ENCOUNTER — Other Ambulatory Visit: Payer: Self-pay | Admitting: Internal Medicine

## 2018-11-22 DIAGNOSIS — R062 Wheezing: Secondary | ICD-10-CM

## 2019-01-06 ENCOUNTER — Encounter: Payer: Self-pay | Admitting: Family Medicine

## 2019-01-06 ENCOUNTER — Other Ambulatory Visit: Payer: Self-pay

## 2019-01-06 ENCOUNTER — Ambulatory Visit (INDEPENDENT_AMBULATORY_CARE_PROVIDER_SITE_OTHER): Payer: Medicare Other | Admitting: Family Medicine

## 2019-01-06 VITALS — BP 132/80 | HR 48 | Temp 97.5°F | Resp 16 | Ht 69.0 in | Wt 184.6 lb

## 2019-01-06 DIAGNOSIS — G3184 Mild cognitive impairment, so stated: Secondary | ICD-10-CM

## 2019-01-06 DIAGNOSIS — G4752 REM sleep behavior disorder: Secondary | ICD-10-CM | POA: Diagnosis not present

## 2019-01-06 DIAGNOSIS — I10 Essential (primary) hypertension: Secondary | ICD-10-CM | POA: Diagnosis not present

## 2019-01-06 DIAGNOSIS — E782 Mixed hyperlipidemia: Secondary | ICD-10-CM

## 2019-01-06 DIAGNOSIS — J3089 Other allergic rhinitis: Secondary | ICD-10-CM | POA: Diagnosis not present

## 2019-01-06 DIAGNOSIS — Z23 Encounter for immunization: Secondary | ICD-10-CM | POA: Diagnosis not present

## 2019-01-06 MED ORDER — IPRATROPIUM BROMIDE 0.06 % NA SOLN: 2.0000 | Freq: Four times a day (QID) | NASAL | 2 refills | Status: DC | PRN

## 2019-01-06 MED ORDER — ATORVASTATIN CALCIUM 20 MG PO TABS: 20.0000 mg | ORAL_TABLET | Freq: Every day | ORAL | 1 refills | Status: DC

## 2019-01-06 MED ORDER — LISINOPRIL 10 MG PO TABS: 10.0000 mg | ORAL_TABLET | Freq: Every day | ORAL | 1 refills | Status: DC

## 2019-01-06 NOTE — Patient Instructions (Addendum)
Thank you for coming to the office today.  Refilled Lisinopril 10mg  once daily- new rx  Refilled Atorvastatin now 20mg  - instead of 40mg  - once daily - [facility]  For wheezing - likely upper airway with sinus drainage / congestion  Start Atrovent nasal spray decongestant 2 sprays in each nostril up to 4 times daily as needed.  Continue Zyrtec  Let me know if not improved we can add Singulair if need.  Use albuterol rescue inhaler as needed  Talk with Dr Delice Lesch for updates on the mind / dementia - she can advise further if the Aricept was not helping   DUE for FASTING BLOOD WORK (no food or drink after midnight before the lab appointment, only water or coffee without cream/sugar on the morning of)  SCHEDULE "Lab Only" visit in the morning at the clinic for lab draw in 6 MONTHS   - Make sure Lab Only appointment is at about 1 week before your next appointment, so that results will be available  For Lab Results, once available within 2-3 days of blood draw, you can can log in to MyChart online to view your results and a brief explanation. Also, we can discuss results at next follow-up visit.   Please schedule a Follow-up Appointment to: Return in about 6 months (around 07/06/2019) for Yearly Medicare Checkup.  If you have any other questions or concerns, please feel free to call the office or send a message through [facility]. You may also schedule an earlier appointment if necessary.  Additionally, you may be receiving a survey about your experience at our office within a few days to 1 week by e-mail or mail. We value your feedback.  Nobie Putnam, DO [place]

## 2019-01-06 NOTE — Progress Notes (Signed)
Subjective:    Patient ID: Travis Craig, male    DOB: 1940-04-24, 78 y.o.   MRN: [id number]  Travis Craig is a 78 y.o. male presenting on 01/06/2019 for New Patient (Initial Visit) (Establish), Dementia, and Allergic Rhinitis   History primarily provided by wife, [name]. Patient has mild cognitive impairment / dementia.  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 Last x-ray was done in 2019, showed stable calcification - he admits allergies and rhinitis / upper airway wheezing at times that is audible to his wife, worse at night, thought it was caused by his lungs, but never diagnosed with asthma or COPD or other breathing disorder - he does not wake from sleep due to dyspnea, not having increased sputum production - Tried flonase for runny nose and drainage but limited results - Takes OTC zyrtec  CHRONIC HTN: Reports history of high blood pressure, mostly well controlled without concern Current Meds - Lisinopril 10mg  daily (rx bottle said BID but they are taking daily) Reports good compliance, took meds today. Tolerating well, w/o complaints. Denies CP, dyspnea, HA, edema, dizziness / lightheadedness  HYPERLIPIDEMIA: - Reports  concerns with statin dose, thought it was too high at 40mg  they cut in half for 20mg  - Currently taking Atorvastatin 20mg , tolerating well without side effects or myalgias   Health Maintenance: Review prior records UTD PNA vaccine Due flu vaccine will get today  Depression screen [facility] 2/9 01/06/2019 09/05/2017 03/22/2017  Decreased Interest 0 1 0  Down, Depressed, Hopeless 0 0 0  PHQ - 2 Score 0 1 0    Past Medical History:  Diagnosis Date  . Chicken pox   . Hyperlipidemia   . Hypertension   . Ulcer    Past Surgical History:  Procedure Laterality Date  . CHOLECYSTECTOMY    . Ulcer surgery     Social History   Socioeconomic History  . Marital status: Married    Spouse name: Not on file  . Number of children: 2  . Years of education: 38  . Highest education level: Not on file  Occupational History  . Occupation: Retired  Scientific laboratory technician  . Financial resource strain: Not hard at all  . Food insecurity    Worry: Never true    Inability: Never true  . Transportation needs    Medical: No    Non-medical: No  Tobacco Use  . Smoking status: Never Smoker  . Smokeless tobacco: Never Used  Substance and Sexual Activity  . Alcohol use: Yes    Alcohol/week: 2.0 standard drinks    Types: 2 Glasses of wine per week  . Drug use: No  . Sexual activity: Not Currently  Lifestyle  . Physical activity    Days per week: 7 days  Minutes per session: 100 min  . Stress: Not at all  Relationships  . Social connections    Talks on phone: More than three times a week    Gets together: More than three times a week    Attends religious service: 1 to 4 times per year    Active member of club or organization: No    Attends meetings of clubs or organizations: Never    Relationship status: Married  . Intimate partner violence    Fear of current or ex partner: No    Emotionally abused: No    Physically abused: No    Forced sexual activity: No  Other Topics Concern  . Not on file   Social History Narrative   Pt lives in single story home, on the 3rd floor, with his wife   Has 2 adult children   Bachelors degree   20 years service in the [employer]   Retired IT-Networking   Family History  Problem Relation Age of Onset  . Hypertension Father   . Alzheimer's disease Father    Current Outpatient Medications on File Prior to Visit  Medication Sig  . albuterol (VENTOLIN HFA) 108 (90 Base) MCG/ACT inhaler Inhale 2 puffs into the lungs every 4 (four) hours as needed.  Marland Kitchen aspirin EC 81 MG tablet Take 81 mg by mouth daily.  . Coenzyme Q10 (COQ10 PO) Take by mouth.  . donepezil (ARICEPT) 5 MG tablet Take 1 tablet daily (Patient not taking: Reported on 01/06/2019)  . Multiple Vitamin (MULTI-VITAMIN DAILY PO) Take by mouth.  . Omega-3 1400 MG CAPS Take 1,600 mg by mouth.   No current facility-administered medications on file prior to visit.     Review of Systems Per HPI unless specifically indicated above      Objective:    BP 132/80 (BP Location: Left Arm, Patient Position: Sitting, Cuff Size: Normal)   Pulse (!) 48   Temp (!) 97.5 F (36.4 C) (Oral)   Resp 16   Ht 5\' 9"  (1.753 m)   Wt 184 lb 9.6 oz (83.7 kg)   SpO2 100%   BMI 27.26 kg/m   Wt Readings from Last 3 Encounters:  01/06/19 184 lb 9.6 oz (83.7 kg)  07/01/18 191 lb 2 oz (86.7 kg)  12/23/17 185 lb (83.9 kg)    Physical Exam Vitals signs and nursing note reviewed.  Constitutional:      General: He is not in acute distress.    Appearance: He is well-developed. He is not diaphoretic.     Comments: Well-appearing, comfortable, cooperative  HENT:     Head: Normocephalic and atraumatic.  Eyes:     General:        Right eye: No discharge.        Left eye: No discharge.     Conjunctiva/sclera: Conjunctivae normal.  Neck:     Musculoskeletal: Normal range of motion and neck supple.     Thyroid: No thyromegaly.  Cardiovascular:     Rate and Rhythm: Normal rate.     Heart sounds: Normal heart sounds.   Pulmonary:     Effort: Pulmonary effort is normal. No respiratory distress.     Breath sounds: Normal breath sounds. No wheezing or rales.  Musculoskeletal: Normal range of motion.  Lymphadenopathy:     Cervical: No cervical adenopathy.  Skin:    General: Skin is warm and dry.     Findings: No erythema or rash.  Neurological:     Mental Status:  He is alert and oriented to person, place, and time.  Psychiatric:        Behavior: Behavior normal.     Comments: Well groomed, good eye contact, difficulty hearing impairs his conversational skills, but speaks appropriately, normal thoughts, not always following conversation      I have personally reviewed the radiology report from Chest X-ray 09/05/17.  Study Result CLINICAL DATA: Cough, wheezing, nasal drip, persistence of symptoms for 2-3 months mainly at night, history hypertension  EXAM: CHEST - 2 VIEW  COMPARISON: None  FINDINGS: Normal heart size, mediastinal contours, and pulmonary vascularity.  Atherosclerotic calcification aorta.  Calcified granulomata in RIGHT lung with small calcified LEFT hilar and AP window lymph nodes.  No acute infiltrate, pleural effusion or pneumothorax.  Bones demineralized.  Eventration of LEFT diaphragm noted.  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  No acute abnormalities.  Increased stool in upper abdomen.   Electronically Signed By: Lavonia Dana M.D. On: 09/05/2017 15:53       Assessment & Plan:   Problem List Items Addressed This Visit    Essential hypertension Controlled Reduce dose to more accurate rx - now rx Lisinopril 10mg  daily, instead of BID Monitor BP    Relevant Medications   atorvastatin (LIPITOR) 20 MG tablet   lisinopril (ZESTRIL) 10 MG tablet   Mixed hyperlipidemia Controlled previous lipids Previous dose 40mg  - they were cutting in half will reduce to 20mg  daily new rx sent    Relevant Medications    atorvastatin (LIPITOR) 20 MG tablet   lisinopril (ZESTRIL) 10 MG tablet    Other Visit Diagnoses    Seasonal allergic rhinitis due to other allergic trigger    -  Primary  Suspected wheezing reported is likely trigger of upper airway symptoms with drainage and sinus Improved on nasal spray and anti histamine Off Flonase now  Trial Atrovent as advised Use albuterol PRN Continue Zyrtec If need can follow up and repeat CXR or consider maintenance inhaler therapy     Relevant Medications   ipratropium (ATROVENT) 0.06 % nasal spray   Needs flu shot       Relevant Orders   Flu Vaccine QUAD High Dose(Fluad) (Completed)      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  Meds ordered this encounter  Medications  . ipratropium (ATROVENT) 0.06 % nasal spray    Sig: Place 2 sprays into both nostrils 4 (four) times daily as needed for rhinitis.    Dispense:  15 mL    Refill:  2  . atorvastatin (LIPITOR) 20 MG tablet    Sig: Take 1 tablet (20 mg total) by mouth daily.    Dispense:  90 tablet    Refill:  1  . lisinopril (ZESTRIL) 10 MG tablet    Sig: Take 1 tablet (10 mg total) by mouth daily.    Dispense:  90 tablet    Refill:  1    Follow up plan: Return in about 6 months (around 07/06/2019) for Yearly Medicare Checkup.  Future labs 07/01/19  Nobie Putnam, [facility] 01/06/2019, 3:20 PM

## 2019-01-07 ENCOUNTER — Other Ambulatory Visit: Payer: Self-pay | Admitting: Family Medicine

## 2019-01-07 DIAGNOSIS — R351 Nocturia: Secondary | ICD-10-CM

## 2019-01-07 DIAGNOSIS — E782 Mixed hyperlipidemia: Secondary | ICD-10-CM

## 2019-01-07 DIAGNOSIS — G4752 REM sleep behavior disorder: Secondary | ICD-10-CM | POA: Insufficient documentation

## 2019-01-07 DIAGNOSIS — G3184 Mild cognitive impairment, so stated: Secondary | ICD-10-CM | POA: Insufficient documentation

## 2019-01-07 DIAGNOSIS — F0390 Unspecified dementia without behavioral disturbance: Secondary | ICD-10-CM | POA: Insufficient documentation

## 2019-01-07 DIAGNOSIS — I10 Essential (primary) hypertension: Secondary | ICD-10-CM

## 2019-01-07 DIAGNOSIS — E871 Hypo-osmolality and hyponatremia: Secondary | ICD-10-CM

## 2019-01-07 DIAGNOSIS — D649 Anemia, unspecified: Secondary | ICD-10-CM

## 2019-01-07 DIAGNOSIS — R7309 Other abnormal glucose: Secondary | ICD-10-CM

## 2019-02-10 ENCOUNTER — Ambulatory Visit (INDEPENDENT_AMBULATORY_CARE_PROVIDER_SITE_OTHER): Payer: Medicare Other | Admitting: Neurology

## 2019-02-10 ENCOUNTER — Encounter: Payer: Self-pay | Admitting: Neurology

## 2019-02-10 ENCOUNTER — Other Ambulatory Visit: Payer: Self-pay

## 2019-02-10 VITALS — BP 196/96 | HR 74 | Ht 71.0 in | Wt 186.2 lb

## 2019-02-10 DIAGNOSIS — F03A0 Unspecified dementia, mild, without behavioral disturbance, psychotic disturbance, mood disturbance, and anxiety: Secondary | ICD-10-CM

## 2019-02-10 DIAGNOSIS — F0390 Unspecified dementia without behavioral disturbance: Secondary | ICD-10-CM

## 2019-02-10 NOTE — Patient Instructions (Signed)
Continue to monitor symptoms over the next 6 months.   FALL PRECAUTIONS: Be cautious when walking. Scan the area for obstacles that may increase the risk of trips and falls. When getting up in the mornings, sit up at the edge of the bed for a few minutes before getting out of bed. Consider elevating the bed at the head end to avoid drop of blood pressure when getting up. Walk always in a well-lit room (use night lights in the walls). Avoid area rugs or power cords from appliances in the middle of the walkways. Use a walker or a cane if necessary and consider physical therapy for balance exercise. Get your eyesight checked regularly.  FINANCIAL OVERSIGHT: Supervision, especially oversight when making financial decisions or transactions is also recommended.  HOME SAFETY: Consider the safety of the kitchen when operating appliances like stoves, microwave oven, and blender. Consider having supervision and share cooking responsibilities until no longer able to participate in those. Accidents with firearms and other hazards in the house should be identified and addressed as well.  DRIVING: Regarding driving, in patients with progressive memory problems, driving will be impaired. We advise to have someone else do the driving if trouble finding directions or if minor accidents are reported. Independent driving assessment is available to determine safety of driving.  ABILITY TO BE LEFT ALONE: If patient is unable to contact 911 operator, consider using LifeLine, or when the need is there, arrange for someone to stay with patients. Smoking is a fire hazard, consider supervision or cessation. Risk of wandering should be assessed by caregiver and if detected at any point, supervision and safe proof recommendations should be instituted.  MEDICATION SUPERVISION: Inability to self-administer medication needs to be constantly addressed. Implement a mechanism to ensure safe administration of the  medications.  RECOMMENDATIONS FOR ALL PATIENTS WITH MEMORY PROBLEMS: 1. Continue to exercise (Recommend 30 minutes of walking everyday, or 3 hours every week) 2. Increase social interactions - continue going to [place] and enjoy social gatherings with friends and family 3. Eat healthy, avoid fried foods and eat more fruits and vegetables 4. Maintain adequate blood pressure, blood sugar, and blood cholesterol level. Reducing the risk of stroke and cardiovascular disease also helps promoting better memory. 5. Avoid stressful situations. Live a simple life and avoid aggravations. Organize your time and prepare for the next day in anticipation. 6. Sleep well, avoid any interruptions of sleep and avoid any distractions in the bedroom that may interfere with adequate sleep quality 7. Avoid sugar, avoid sweets as there is a strong link between excessive sugar intake, diabetes, and cognitive impairment The Mediterranean diet has been shown to help patients reduce the risk of progressive memory disorders and reduces cardiovascular risk. This includes eating fish, eat fruits and green leafy vegetables, nuts like almonds and hazelnuts, walnuts, and also use olive oil. Avoid fast foods and fried foods as much as possible. Avoid sweets and sugar as sugar use has been linked to worsening of memory function.  There is always a concern of gradual progression of memory problems. If this is the case, then we may need to adjust level of care according to patient needs. Support, both to the patient and caregiver, should then be put into place.

## 2019-02-10 NOTE — Progress Notes (Signed)
NEUROLOGY FOLLOW UP OFFICE NOTE  [name] BE:[id number] [birth year]  HISTORY OF PRESENT ILLNESS: I had the pleasure of seeing Travis Craig in follow-up in the neurology clinic on 02/10/2019.  The patient was last seen 7 months ago for mild cognitive impairment. He is again accompanied by his wife who helps supplement the history today.  MOCA score 23/30 in March 2020 (26/30 in August 2019). He was started on Donepezil 5mg  daily on last visit. They moved to a new house in [place] last June, his wife reports he has not done well, getting confused more. He stopped driving in July. He was not getting lost but his wife was concerned about his vision. His wife manages his medications, he would put medications in the wrong slot in his pillbox and would miss 2-3 days. She had stopped the Donepezil because she felt he was worse on it, "totally different thinking person," and weaned his off last August. She feels he has improved a lot but "still missing some pieces." He has difficulty multitasking. His wife took over finances because he was forgetting passwords. He misplaces things frequently. His wife would put away things in their new home, then he would re-do it and they could not find where things are. No personality changes, paranoia, or hallucinations. Sleep is good, she was previously reporting movements in sleep, he does not move in bed as much anymore.    History on Initial Assessment 11/19/2017: This is a pleasant 78 year old right-handed man with a history of hypertension, hyperlipidemia, presenting for evaluation of worsening memory. His wife is also very concerned about significant movements in sleep where he would sometimes hit her or fall out of bed. He feels his memory is "slipping some but not that bad." His wife of 14 years started noticing gradual memory changes for a few years, but more noticeable when they moved to independent living 3 months ago, the day they moved, he got lost in  the building, he could not figure out how to do laundry on their quilt, which he previously did fine with, he did not know where to put soap in the dishwasher. His wife felt this was related to the stress of the move, this is better now. He misplaces his glasses frequently. He occasionally repeats himself, but his wife feels his hearing also needs to be checked. He continues to manage bills without difficulties, he missed one payment in the past year. He drives minimally without getting lost. He manages his own pillbox and may take it later in the day but usually takes his medication daily. 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 He does not remember his dreams, except for one dream 5 years ago where he was being chased by an alligator, he got out of bed and ran into the window, tore up the blinds. He had a bruise on his head. He has not had any sleep studies done. When she touches him, he seems to quiet down. His wife reports it almost looks like PTSD, but he was never in combat. His wife denies any personality changes, no paranoia or hallucinations. MMSE at PCP office in May 2019 was 29/30.   He has occasional hand  tremors, L>R. He has noticed a decreased sense of smell. 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 He denies any headaches, dizziness, diplopia, dysarthria/dysphagia, neck/back pain, focal numbness/tingling/weakness, incontinence. No falls. His father had Alzheimer's dementia, his mother and both paternal grandparents had dementia, they report his paternal grandmother was institutionalized at age 11. He denies any significant head injuries, he does not drink alcohol.   Diagnostic Data: MRI brain without contrast done 11/2017  which did not show any acute changes, there was mild diffuse volume loss and mild chronic microvascular disease.  He had a sleep study which did not show any evidence of sleep apnea, there were frequent limb movements noted, a few were noted during REM with leg-raising and arm movement.   PAST MEDICAL HISTORY: Past Medical History:  Diagnosis Date  . Chicken pox   . Hyperlipidemia   . Hypertension   . Ulcer     MEDICATIONS: Current Outpatient Medications on File Prior to Visit  Medication Sig Dispense Refill  . albuterol (VENTOLIN HFA) 108 (90 Base) MCG/ACT inhaler Inhale 2 puffs into the lungs every 4 (four) hours as needed.    Marland Kitchen aspirin EC 81 MG tablet Take 81 mg by mouth daily.    Marland Kitchen atorvastatin (LIPITOR) 20 MG tablet Take 1 tablet (20 mg total) by mouth daily. 90 tablet 1  . Coenzyme Q10 (COQ10 PO) Take by mouth.    . donepezil (ARICEPT) 5 MG tablet Take 1 tablet daily (Patient not taking: Reported on 01/06/2019) 90 tablet 3  . ipratropium (ATROVENT) 0.06 % nasal spray Place 2 sprays into both nostrils 4 (four) times daily as needed for rhinitis. 15 mL 2  . lisinopril (ZESTRIL) 10 MG tablet Take 1 tablet (10 mg total) by mouth daily. 90 tablet 1  . Multiple Vitamin (MULTI-VITAMIN DAILY PO) Take by mouth.    . Omega-3 1400 MG CAPS Take 1,600 mg by mouth.     No current facility-administered medications on file prior to visit.     ALLERGIES: No Known Allergies  FAMILY HISTORY: Family History  Problem Relation Age of Onset  . Hypertension Father   . Alzheimer's disease Father     SOCIAL HISTORY: Social History   Socioeconomic History  . Marital status: Married    Spouse name: Not on file  . Number of children: 2  . Years of education: 72  . Highest education level: Not on file  Occupational History  . Occupation: Retired  Scientific laboratory technician  . Financial resource strain: Not hard at all  . Food insecurity    Worry: Never true    Inability: Never true  . Transportation  needs    Medical: No    Non-medical: No  Tobacco Use  . Smoking status: Never Smoker  . Smokeless tobacco: Never Used  Substance and Sexual Activity  . Alcohol use: Yes    Alcohol/week: 2.0 standard drinks    Types: 2 Glasses of wine per week  . Drug use: No  . Sexual activity: Not Currently  Lifestyle  . Physical activity    Days per week: 7 days    Minutes per session: 100 min  . Stress: Not at all  Relationships  . Social connections    Talks on phone: More than three times a week    Gets together: More than three times a week    Attends religious service: 1 to 4 times per year    Active member of club or organization: No    Attends  meetings of clubs or organizations: Never    Relationship status: Married  . Intimate partner violence    Fear of current or ex partner: No    Emotionally abused: No    Physically abused: No    Forced sexual activity: No  Other Topics Concern  . Not on file  Social History Narrative   Pt lives in single story home, on the 3rd floor, with his wife   Has 2 adult children   Bachelors degree   20 years service in the [employer]   Retired IT-Networking    REVIEW OF SYSTEMS: Constitutional: No fevers, chills, or sweats, no generalized fatigue, change in appetite Eyes: No visual changes, double vision, eye pain Ear, nose and throat: No hearing loss, ear pain, nasal congestion, sore throat Cardiovascular: No chest pain, palpitations Respiratory:  No shortness of breath at rest or with exertion, wheezes GastrointestinaI: No nausea, vomiting, diarrhea, abdominal pain, fecal incontinence Genitourinary:  No dysuria, urinary retention or frequency Musculoskeletal:  No neck pain, back pain Integumentary: No rash, pruritus, skin lesions Neurological: as above Psychiatric: No depression, insomnia, anxiety Endocrine: No palpitations, fatigue, diaphoresis, mood swings, change in appetite, change in weight, increased thirst Hematologic/Lymphatic:  No  anemia, purpura, petechiae. Allergic/Immunologic: no itchy/runny eyes, nasal congestion, recent allergic reactions, rashes  PHYSICAL EXAM: Vitals:   02/10/19 1604  BP: (!) 196/96  Pulse: 74  SpO2: 100%   General: No acute distress Head:  Normocephalic/atraumatic Skin/Extremities: No rash, no edema Neurological Exam: alert and oriented to person, place, and year. No aphasia or dysarthria. Fund of knowledge is appropriate.  Recent and remote memory impaired.  Attention and concentration are normal.      [employer] Mental Exam 02/20/2019  Weekday Correct 0  Current year 1  What state are we in? 1  Amount spent 1  Amount left 2  # of Animals 2  5 objects recall 2  Number series 0  Hour markers 0  Time correct 0  Placed X in triangle correctly 1  Largest Figure 1  Name of male 2  Date back to work 0  Type of work 2  State she lived in 2  Total score [place]  07/01/2018 11/19/2017  Visuospatial/ Executive (0/5) 3 5  Naming (0/3) 3 2  Attention: Read list of digits (0/2) 2 2  Attention: Read list of letters (0/1) 0 1  Attention: Serial 7 subtraction starting at 100 (0/3) 3 3  Language: Repeat phrase (0/2) 1 2  Language : Fluency (0/1) 1 0  Abstraction (0/2) 2 2  Delayed Recall (0/5) 2 3  Orientation (0/6) 6 6  Total 23 26    Cranial nerves: Pupils equal, round, reactive to light.  Extraocular movements intact with no nystagmus. Visual fields full. Facial sensation intact. No facial asymmetry. Tongue, uvula, palate midline.  Motor: Bulk and tone normal, no cogwheeling, muscle strength 5/5 throughout with no pronator drift.  Finger to nose testing intact.  Gait: narrow-based and steady, able to tandem walk adequately.  Romberg negative.  IMPRESSION: This is a pleasant 78 yo RH man with a history of hypertension, hyperlipidemia, with progressive memory decline, now having more difficulties with complex tasks. 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 SLUMS score today 17/30 ([place] 23/30 in 05/2018, 23/30 (26/30 in August 2019). His wife felt he was worse on Donepezil and would like to hold off on starting another medication until they are more settled  in their new home. He is not having as much movements in his sleep. He does not drive. Continue close supervision. Follow-up in 6 months, they know to call for any changes.   Thank you for allowing me to participate in his care.  Please do not hesitate to call for any questions or concerns.    Ellouise Newer, M.D.   CC: Dr. Parks Ranger

## 2019-02-23 IMAGING — MR MR HEAD W/O CM
10 series · 48 of 48 positions shown · non-contrast
Comparison: None.

CLINICAL DATA: Memory loss.  Nightmares for 10-15 years

EXAM:
MRI HEAD WITHOUT CONTRAST
TECHNIQUE: Multiplanar, multiecho pulse sequences of the brain and surrounding
structures were obtained without intravenous contrast.

[Series 2: T1 · sagittal · 5.0mm · 0.45mm/px · 3 of 21 slices shown]
[im 1/21]
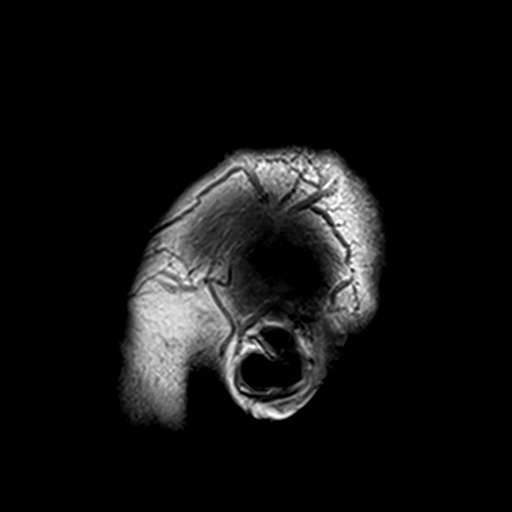
[im 11/21]
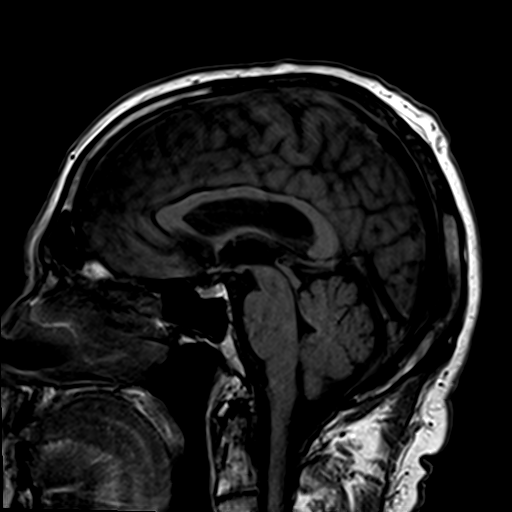
[im 21/21]
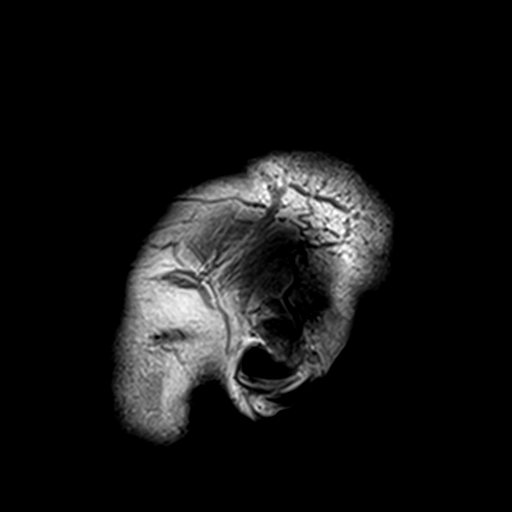

[Series 3: DWI · axial · 3.0mm · 1.80mm/px · z∈[-49,+96]mm · 9 of 100 slices shown (1 of 4)]
[im 1/100]
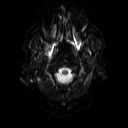
[im 13/100]
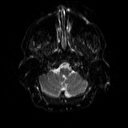
[im 25/100]
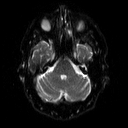
[im 38/100]
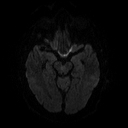
[im 50/100]
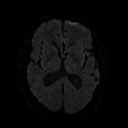
[im 62/100]
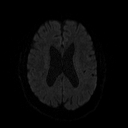
[im 75/100]
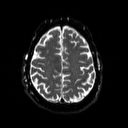
[im 87/100]
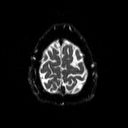
[im 100/100]
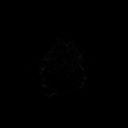

[Series 4: DWI · axial · 3.0mm · 1.80mm/px · z∈[-49,+96]mm · 4 of 45 slices shown (2 of 4)]
[im 1/45]
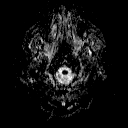
[im 15/45]
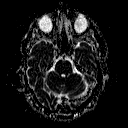
[im 30/45]
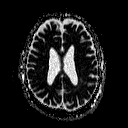
[im 45/45]
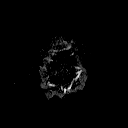

[Series 5: DWI · coronal · 5.0mm · 1.80mm/px · 6 of 68 slices shown (3 of 4)]
[im 1/68]
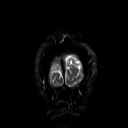
[im 14/68]
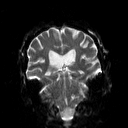
[im 27/68]
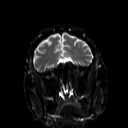
[im 41/68]
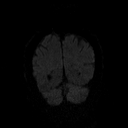
[im 54/68]
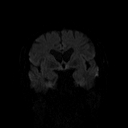
[im 68/68]
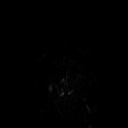

[Series 6: DWI · coronal · 5.0mm · 1.80mm/px · 3 of 34 slices shown (4 of 4)]
[im 1/34]
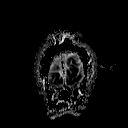
[im 17/34]
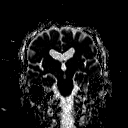
[im 34/34]
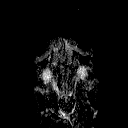

[Series 7: T2 · axial · 5.0mm · 0.51mm/px · z∈[-48,+97]mm · 2 of 22 slices shown (1 of 2)]
[im 1/22]
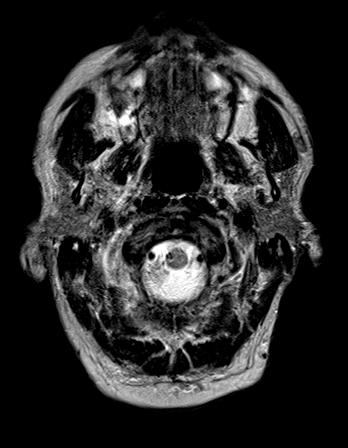
[im 22/22]
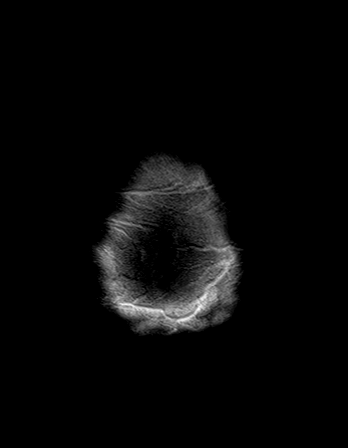

[Series 8: FLAIR · axial · 3.0mm · 0.45mm/px · z∈[-48,+95]mm · 3 of 32 slices shown]
[im 1/32]
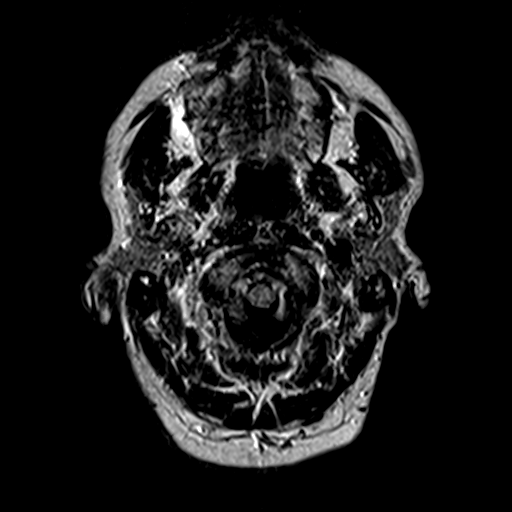
[im 16/32]
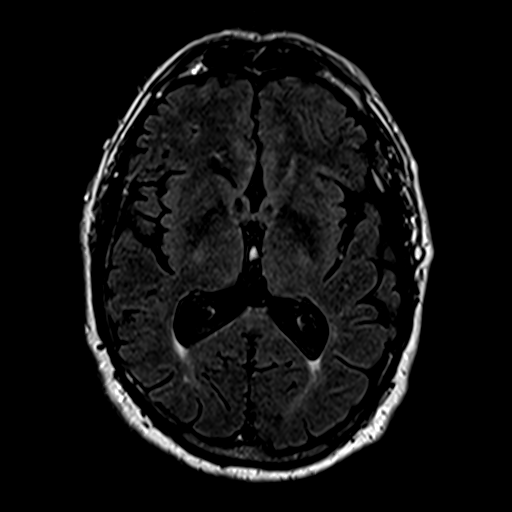
[im 32/32]
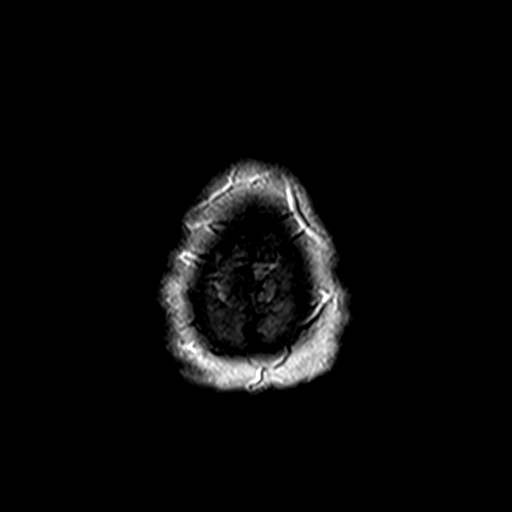

[Series 10: swi_images · axial · 5.0mm · 0.90mm/px · z∈[-48,+95]mm · 3 of 30 slices shown]
[im 1/30]
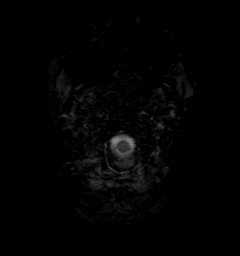
[im 15/30]
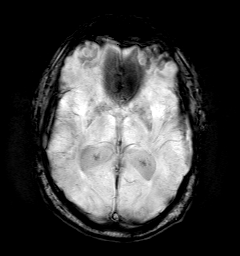
[im 30/30]
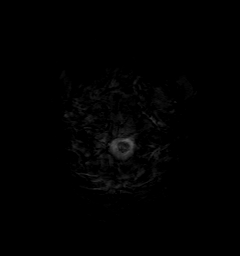

[Series 11: t1_mpr_tra · axial · 1.0mm · 0.71mm/px · z∈[-46,+95]mm · 13 of 144 slices shown]
[im 1/144]
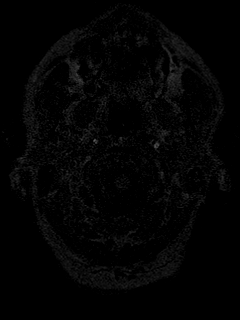
[im 12/144]
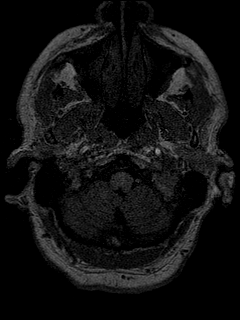
[im 24/144]
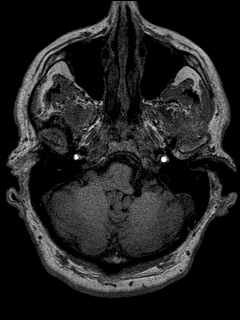
[im 36/144]
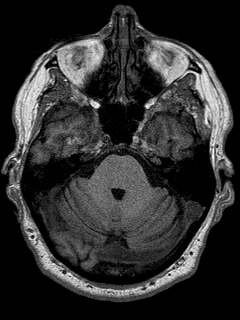
[im 48/144]
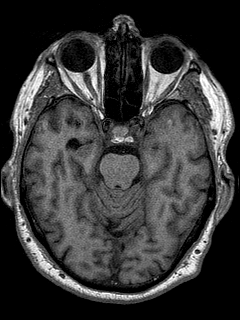
[im 60/144]
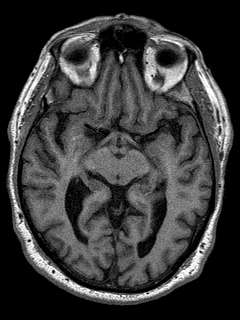
[im 72/144]
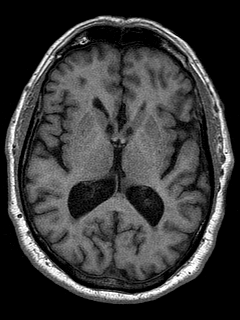
[im 84/144]
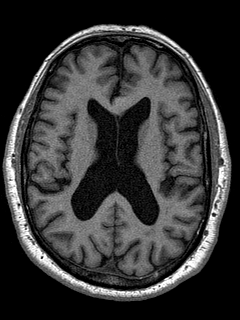
[im 96/144]
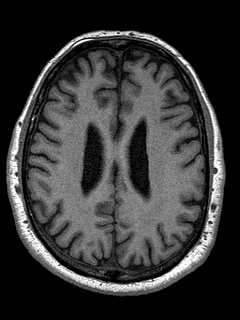
[im 108/144]
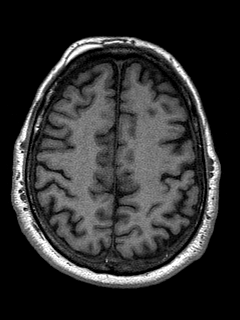
[im 120/144]
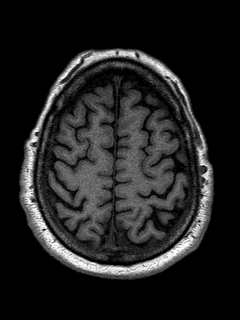
[im 132/144]
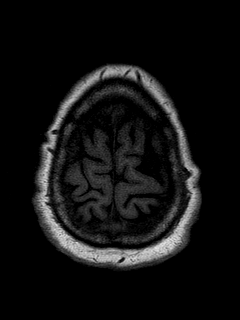
[im 144/144]
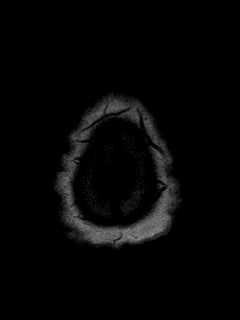

[Series 12: T2 · coronal · 5.0mm · 0.45mm/px · 2 of 25 slices shown (2 of 2)]
[im 1/25]
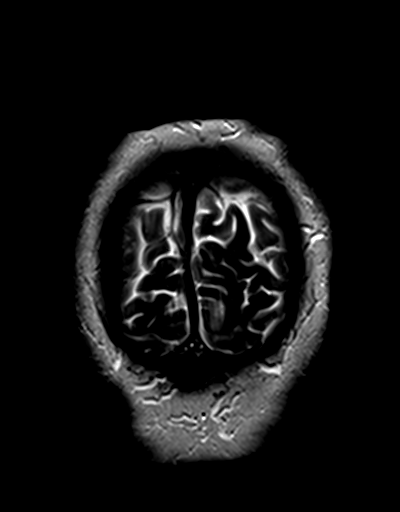
[im 25/25]
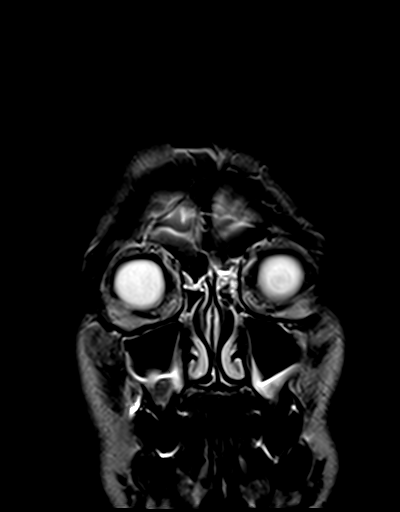

[48 of 48 positions shown; findings below may reference images not displayed]

FINDINGS: Brain: No mass, hydrocephalus, collection, or specific atrophy.
There is volume loss with ventriculomegaly, mild for age. Mild for
age chronic small vessel ischemia in the cerebral white matter.

Vascular: Normal flow voids.

Skull and upper cervical spine: Normal marrow signal.

Sinuses/Orbits: Bilateral cataract resection. Mild patchy mucosal
thickening in the paranasal sinuses.

Other: Subgaleal lipoma along the right forehead.
IMPRESSION: No specific or reversible explanation for memory loss.

## 2019-05-19 ENCOUNTER — Other Ambulatory Visit: Payer: Self-pay

## 2019-05-19 ENCOUNTER — Telehealth: Payer: Self-pay | Admitting: Family Medicine

## 2019-05-19 DIAGNOSIS — I10 Essential (primary) hypertension: Secondary | ICD-10-CM

## 2019-05-19 DIAGNOSIS — E782 Mixed hyperlipidemia: Secondary | ICD-10-CM

## 2019-05-19 DIAGNOSIS — J3089 Other allergic rhinitis: Secondary | ICD-10-CM

## 2019-05-19 MED ORDER — ATORVASTATIN CALCIUM 20 MG PO TABS: 20.0000 mg | ORAL_TABLET | Freq: Every day | ORAL | 1 refills | Status: DC

## 2019-05-19 MED ORDER — IPRATROPIUM BROMIDE 0.06 % NA SOLN: 2.0000 | Freq: Four times a day (QID) | NASAL | 2 refills | Status: DC | PRN

## 2019-05-19 MED ORDER — LISINOPRIL 10 MG PO TABS: 10.0000 mg | ORAL_TABLET | Freq: Every day | ORAL | 1 refills | Status: DC

## 2019-05-19 NOTE — Telephone Encounter (Signed)
Pt.wife called requesting refill on Atorvastatin,ipratropium,lisinopril called into mail order

## 2019-07-01 ENCOUNTER — Other Ambulatory Visit: Payer: Self-pay

## 2019-07-01 ENCOUNTER — Other Ambulatory Visit: Payer: Medicare Other

## 2019-07-01 DIAGNOSIS — I10 Essential (primary) hypertension: Secondary | ICD-10-CM | POA: Diagnosis not present

## 2019-07-01 DIAGNOSIS — E871 Hypo-osmolality and hyponatremia: Secondary | ICD-10-CM | POA: Diagnosis not present

## 2019-07-01 DIAGNOSIS — R7309 Other abnormal glucose: Secondary | ICD-10-CM

## 2019-07-01 DIAGNOSIS — D649 Anemia, unspecified: Secondary | ICD-10-CM | POA: Diagnosis not present

## 2019-07-01 DIAGNOSIS — R351 Nocturia: Secondary | ICD-10-CM

## 2019-07-01 DIAGNOSIS — E782 Mixed hyperlipidemia: Secondary | ICD-10-CM

## 2019-07-02 LAB — LIPID PANEL
Cholesterol: 150 mg/dL (ref <200)
HDL: 52 mg/dL (ref >=40)
LDL Cholesterol (Calc): 81 mg/dL (calc)
Non-HDL Cholesterol (Calc): 98 mg/dL (calc) (ref <130)
Total CHOL/HDL Ratio: 2.9 (calc) (ref <5.0)
Triglycerides: 83 mg/dL (ref <150)

## 2019-07-02 LAB — COMPLETE METABOLIC PANEL WITH GFR
AG Ratio: 1.4 (calc) (ref 1.0–2.5)
ALT: 19 U/L (ref 9–46)
AST: 26 U/L (ref 10–35)
Albumin: 3.8 g/dL (ref 3.6–5.1)
Alkaline phosphatase (APISO): 63 U/L (ref 35–144)
BUN: 15 mg/dL (ref 7–25)
CO2: 30 mmol/L (ref 20–32)
Calcium: 9.5 mg/dL (ref 8.6–10.3)
Chloride: 100 mmol/L (ref 98–110)
Creat: 0.82 mg/dL (ref 0.70–1.18)
GFR, Est African American: 97 mL/min/{1.73_m2} (ref >=60)
GFR, Est Non African American: 84 mL/min/{1.73_m2} (ref >=60)
Globulin: 2.8 g/dL (calc) (ref 1.9–3.7)
Glucose, Bld: 104 mg/dL — ABNORMAL HIGH (ref 65–99)
Potassium: 4.3 mmol/L (ref 3.5–5.3)
Sodium: 134 mmol/L — ABNORMAL LOW (ref 135–146)
Total Bilirubin: 1.1 mg/dL (ref 0.2–1.2)
Total Protein: 6.6 g/dL (ref 6.1–8.1)

## 2019-07-02 LAB — CBC WITH DIFFERENTIAL/PLATELET
Absolute Monocytes: 400 cells/uL (ref 200–950)
Basophils Absolute: 29 cells/uL (ref 0–200)
Basophils Relative: 0.8 %
Eosinophils Absolute: 151 cells/uL (ref 15–500)
Eosinophils Relative: 4.2 %
HCT: 37.9 % — ABNORMAL LOW (ref 38.5–50.0)
Hemoglobin: 13.3 g/dL (ref 13.2–17.1)
Lymphs Abs: 842 cells/uL — ABNORMAL LOW (ref 850–3900)
MCH: 34.8 pg — ABNORMAL HIGH (ref 27.0–33.0)
MCHC: 35.1 g/dL (ref 32.0–36.0)
MCV: 99.2 fL (ref 80.0–100.0)
MPV: 10.3 fL (ref 7.5–12.5)
Monocytes Relative: 11.1 %
Neutro Abs: 2178 cells/uL (ref 1500–7800)
Neutrophils Relative %: 60.5 %
Platelets: 153 10*3/uL (ref 140–400)
RBC: 3.82 10*6/uL — ABNORMAL LOW (ref 4.20–5.80)
RDW: 12 % (ref 11.0–15.0)
Total Lymphocyte: 23.4 %
WBC: 3.6 10*3/uL — ABNORMAL LOW (ref 3.8–10.8)

## 2019-07-02 LAB — PSA: PSA: 1.9 ng/mL (ref <=4.0)

## 2019-07-02 LAB — HEMOGLOBIN A1C
Hgb A1c MFr Bld: 5.8 % of total Hgb — ABNORMAL HIGH (ref <5.7)
Mean Plasma Glucose: 120 (calc)
eAG (mmol/L): 6.6 (calc)

## 2019-07-02 LAB — TSH: TSH: 2.39 mIU/L (ref 0.40–4.50)

## 2019-07-06 ENCOUNTER — Encounter: Payer: Self-pay | Admitting: Family Medicine

## 2019-07-06 ENCOUNTER — Ambulatory Visit (INDEPENDENT_AMBULATORY_CARE_PROVIDER_SITE_OTHER): Payer: Medicare Other | Admitting: Family Medicine

## 2019-07-06 ENCOUNTER — Other Ambulatory Visit: Payer: Self-pay

## 2019-07-06 VITALS — BP 160/80 | HR 45 | Temp 97.3°F | Resp 16 | Ht 71.0 in | Wt 186.0 lb

## 2019-07-06 DIAGNOSIS — E871 Hypo-osmolality and hyponatremia: Secondary | ICD-10-CM | POA: Diagnosis not present

## 2019-07-06 DIAGNOSIS — R7309 Other abnormal glucose: Secondary | ICD-10-CM

## 2019-07-06 DIAGNOSIS — E782 Mixed hyperlipidemia: Secondary | ICD-10-CM

## 2019-07-06 DIAGNOSIS — I10 Essential (primary) hypertension: Secondary | ICD-10-CM | POA: Diagnosis not present

## 2019-07-06 DIAGNOSIS — G3184 Mild cognitive impairment, so stated: Secondary | ICD-10-CM | POA: Diagnosis not present

## 2019-07-06 DIAGNOSIS — G4752 REM sleep behavior disorder: Secondary | ICD-10-CM | POA: Diagnosis not present

## 2019-07-06 NOTE — Assessment & Plan Note (Signed)
Elevated initial BP, repeat manual check improved slightly. - Home BP readings mixed result, seem elevated Not at goal    Plan:  1. INCREASE current pills Lisinopril 10mg  daily now x 2 = 20mg  daily 2. Encourage improved lifestyle - low sodium diet, regular exercise 3. Continue monitor BP outside office, bring readings to next visit, if persistently >140/90 or new symptoms notify office sooner

## 2019-07-06 NOTE — Patient Instructions (Addendum)
Thank you for coming to the office today.  1. Chemistry - Mostly Normal. Improved sodium up to 134, previously was lower in past. Otherwise normal including other electrolytes, kidney and liver function. Borderline elevated glucose at 104, but not a significant problem.  2. Hemoglobin A1c (Diabetes screening) - 5.8, improved, still mild elevated in range of Pre-Diabetes (>5.7 to 6.4)   3. PSA Prostate Cancer Screening - 1.9, negative.  4. TSH Thyroid Function Tests - Normal.  5. Cholesterol - Normal cholesterol results. Well controlled on Atorvastatin.  6. CBC Blood Counts - Normal Hemoglobin 13.3. Slightly low WBC 3.6, similar to previous 4 to 3.9  ----------------------------------  Double Lisinopril 10mg  now take 2 little tablets for dose of 20mg  a day.  CHeck BP at home with new cuff eventually, keep track, goal is < 140/90  Will stay tuned for [facility] Neurology information after Thursday.  Please schedule a Follow-up Appointment to: Return in about 3 months (around 10/06/2019) for 3 month HTN, Dementia neuro follow-up.  If you have any other questions or concerns, please feel free to call the office or send a message through [place]. You may also schedule an earlier appointment if necessary.  Additionally, you may be receiving a survey about your experience at our office within a few days to 1 week by e-mail or mail. We value your feedback.  Nobie Putnam, DO [place]

## 2019-07-06 NOTE — Progress Notes (Signed)
Subjective:    Patient ID: Travis Craig, male    DOB: November 13, 1940, 79 y.o.   MRN: [id number]  Travis Craig is a 79 y.o. male presenting on 07/06/2019 for Hypertension and Dementia  History primarily provided by wife, Travis Craig. Patient has mild cognitive impairment / dementia.  HPI   Mild Cognitive Impairment / Mild Dementia Previously followed by [facility] Neurology Dr Travis Craig. 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 They thought he had side effects to medication, but it seems it was flared up by a recent move instead. Now they remain off med. - He no longer drives. Keys given to wife - He has not tried any other medications for his cognitive impairment  CHRONIC HTN: Recent history elevated BP, previously was on Lisinopril 20mg , they were cutting tabs in half for 10mg  he had done better. Now recent elevated BP readings. Current Meds - Lisinopril 10mg  daily Reports good compliance, took meds today. Tolerating well, w/o complaints. Denies CP, dyspnea, HA, edema, dizziness / lightheadedness  HYPERLIPIDEMIA: - Currently taking Atorvastatin 20mg , tolerating well without side effects or myalgias  Health Maintenance:  UTD COVID19 vaccine Pfizer 04/23/19, 05/14/19  Depression screen PHQ 2/9 01/06/2019 09/05/2017 03/22/2017  Decreased Interest 0 1 0  Down, Depressed, Hopeless 0 0 0  PHQ - 2 Score 0 1 0    Past Medical History:  Diagnosis Date  . Chicken pox   . Hearing loss   . Hyperlipidemia   . Hypertension   . Ulcer    Past Surgical History:  Procedure Laterality Date  . BLEPHAROPLASTY    . CHOLECYSTECTOMY    . Ulcer surgery     Social History   Socioeconomic History  . Marital status: Married    Spouse name: Not on file  . Number of children: 2  . Years of education: 45    . Highest education level: Not on file  Occupational History  . Occupation: Retired  Tobacco Use  . Smoking status: Never Smoker  . Smokeless tobacco: Never Used  Substance and Sexual Activity  . Alcohol use: Yes    Alcohol/week: 2.0 standard drinks    Types: 2 Glasses of wine per week  . Drug use: No  . Sexual activity: Not Currently    Partners: Female  Other Topics Concern  . Not on file  Social History Narrative   Pt lives in single story home, on the 3rd floor, with his wife   Has 2 adult children   Bachelors degree   20 years service in the [employer]   Retired IT-Networking   Right handed   Social Determinants of Health   Financial Resource Strain:   . Difficulty of Paying Living Expenses:   Food Insecurity:   . Worried About Charity fundraiser in the Last Year:   . Arboriculturist in the Last Year:   Transportation Needs:   . Film/video editor (Medical):   Marland Kitchen Lack of Transportation (Non-Medical):   Physical Activity:   . Days of Exercise per Week:   . Minutes of Exercise per Session:   Stress:   . Feeling of Stress :   Social Connections:   . Frequency of Communication with Friends and Family:   . Frequency of Social Gatherings with Friends and Family:   .  Attends Religious Services:   . Active Member of Clubs or Organizations:   . Attends Archivist Meetings:   Marland Kitchen Marital Status:   Intimate Partner Violence:   . Fear of Current or Ex-Partner:   . Emotionally Abused:   Marland Kitchen Physically Abused:   . Sexually Abused:    Family History  Problem Relation Age of Onset  . Hypertension Father   . Alzheimer's disease Father    Current Outpatient Medications on File Prior to Visit  Medication Sig  . albuterol (VENTOLIN HFA) 108 (90 Base) MCG/ACT inhaler Inhale 2 puffs into the lungs every 4 (four) hours as needed.  Marland Kitchen aspirin EC 81 MG tablet Take 81 mg by mouth daily.  Marland Kitchen atorvastatin (LIPITOR) 20 MG tablet Take 1 tablet (20 mg total) by mouth daily.  .  Coenzyme Q10 (COQ10 PO) Take by mouth.  . fluticasone (FLONASE) 50 MCG/ACT nasal spray Place 1 spray into both nostrils 2 (two) times daily.  Marland Kitchen ipratropium (ATROVENT) 0.06 % nasal spray Place 2 sprays into both nostrils 4 (four) times daily as needed for rhinitis.  Marland Kitchen lisinopril (ZESTRIL) 10 MG tablet Take 1 tablet (10 mg total) by mouth daily.  . Multiple Vitamin (MULTI-VITAMIN DAILY PO) Take by mouth.  . Omega-3 1400 MG CAPS Take 1,600 mg by mouth.  . donepezil (ARICEPT) 5 MG tablet Take 5 mg by mouth daily.   No current facility-administered medications on file prior to visit.    Review of Systems Per HPI unless specifically indicated above      Objective:    BP (!) 160/80 (BP Location: Left Arm, Cuff Size: Normal)   Pulse (!) 45   Temp (!) 97.3 F (36.3 C) (Temporal)   Resp 16   Ht 5\' 11"  (1.803 m)   Wt 186 lb (84.4 kg)   BMI 25.94 kg/m   Wt Readings from Last 3 Encounters:  07/06/19 186 lb (84.4 kg)  02/10/19 186 lb 4 oz (84.5 kg)  01/06/19 184 lb 9.6 oz (83.7 kg)    Physical Exam Vitals and nursing note reviewed.  Constitutional:      General: He is not in acute distress.    Appearance: He is well-developed. He is not diaphoretic.     Comments: Well-appearing, comfortable, cooperative  HENT:     Head: Normocephalic and atraumatic.  Eyes:     General:        Right eye: No discharge.        Left eye: No discharge.     Conjunctiva/sclera: Conjunctivae normal.     Pupils: Pupils are equal, round, and reactive to light.  Neck:     Thyroid: No thyromegaly.  Cardiovascular:     Rate and Rhythm: Normal rate and regular rhythm.     Heart sounds: Normal heart sounds. No murmur.  Pulmonary:     Effort: Pulmonary effort is normal. No respiratory distress.     Breath sounds: Normal breath sounds. No wheezing or rales.  Abdominal:     General: Bowel sounds are normal. There is no distension.     Palpations: Abdomen is soft. There is no mass.     Tenderness: There is no  abdominal tenderness.  Musculoskeletal:        General: No tenderness. Normal range of motion.     Cervical back: Normal range of motion and neck supple.     Right lower leg: Edema (+1 pitting varicose veins) present.     Left lower leg: Edema (non  pitting edema, varicose veins) present.     Comments: Upper / Lower Extremities: - Normal muscle tone, strength bilateral upper extremities 5/5, lower extremities 5/5  Lymphadenopathy:     Cervical: No cervical adenopathy.  Skin:    General: Skin is warm and dry.     Findings: No erythema or rash.  Neurological:     Mental Status: He is alert and oriented to person, place, and time.     Comments: Distal sensation intact to light touch all extremities  Psychiatric:        Behavior: Behavior normal.     Comments: Well groomed, good eye contact, normal speech and thoughts        Results for orders placed or performed in visit on 07/01/19  TSH  Result Value Ref Range   TSH 2.39 0.40 - 4.50 mIU/L  PSA  Result Value Ref Range   PSA 1.9 < OR = 4.0 ng/mL  Lipid panel  Result Value Ref Range   Cholesterol 150 <200 mg/dL   HDL 52 > OR = 40 mg/dL   Triglycerides 83 <150 mg/dL   LDL Cholesterol (Calc) 81 mg/dL (calc)   Total CHOL/HDL Ratio 2.9 <5.0 (calc)   Non-HDL Cholesterol (Calc) 98 <130 mg/dL (calc)  COMPLETE METABOLIC PANEL WITH GFR  Result Value Ref Range   Glucose, Bld 104 (H) 65 - 99 mg/dL   BUN 15 7 - 25 mg/dL   Creat 0.82 0.70 - 1.18 mg/dL   GFR, Est Non African American 84 > OR = 60 mL/min/1.32m2   GFR, Est African American 97 > OR = 60 mL/min/1.72m2   BUN/Creatinine Ratio NOT APPLICABLE 6 - 22 (calc)   Sodium 134 (L) 135 - 146 mmol/L   Potassium 4.3 3.5 - 5.3 mmol/L   Chloride 100 98 - 110 mmol/L   CO2 30 20 - 32 mmol/L   Calcium 9.5 8.6 - 10.3 mg/dL   Total Protein 6.6 6.1 - 8.1 g/dL   Albumin 3.8 3.6 - 5.1 g/dL   Globulin 2.8 1.9 - 3.7 g/dL (calc)   AG Ratio 1.4 1.0 - 2.5 (calc)   Total Bilirubin 1.1 0.2 - 1.2  mg/dL   Alkaline phosphatase (APISO) 63 35 - 144 U/L   AST 26 10 - 35 U/L   ALT 19 9 - 46 U/L  CBC with Differential/Platelet  Result Value Ref Range   WBC 3.6 (L) 3.8 - 10.8 Thousand/uL   RBC 3.82 (L) 4.20 - 5.80 Million/uL   Hemoglobin 13.3 13.2 - 17.1 g/dL   HCT 37.9 (L) 38.5 - 50.0 %   MCV 99.2 80.0 - 100.0 fL   MCH 34.8 (H) 27.0 - 33.0 pg   MCHC 35.1 32.0 - 36.0 g/dL   RDW 12.0 11.0 - 15.0 %   Platelets 153 140 - 400 Thousand/uL   MPV 10.3 7.5 - 12.5 fL   Neutro Abs 2,178 1,500 - 7,800 cells/uL   Lymphs Abs 842 (L) 850 - 3,900 cells/uL   Absolute Monocytes 400 200 - 950 cells/uL   Eosinophils Absolute 151 15 - 500 cells/uL   Basophils Absolute 29 0 - 200 cells/uL   Neutrophils Relative % 60.5 %   Total Lymphocyte 23.4 %   Monocytes Relative 11.1 %   Eosinophils Relative 4.2 %   Basophils Relative 0.8 %  Hemoglobin A1c  Result Value Ref Range   Hgb A1c MFr Bld 5.8 (H) <5.7 % of total Hgb   Mean Plasma Glucose 120 (calc)   eAG (  mmol/L) 6.6 (calc)      Assessment & Plan:   Problem List Items Addressed This Visit    REM behavioral disorder   Mixed hyperlipidemia   Mild cognitive impairment - Primary    Clinically with MCI memory loss Some gradual decline Previously worked up by [facility] with some component of REM sleep disorder as well. Was on Donepezil 5mg  previously, self discontinued thought was not effective, has been off >6+ months  They plan to now go to [facility] instead of back to [place] for now. Upcoming virtual apt.      Hyponatremia   Essential hypertension    Elevated initial BP, repeat manual check improved slightly. - Home BP readings mixed result, seem elevated Not at goal    Plan:  1. INCREASE current pills Lisinopril 10mg  daily now x 2 = 20mg  daily 2. Encourage improved lifestyle - low sodium diet, regular exercise 3. Continue monitor BP outside office, bring readings to next visit, if persistently >140/90 or new symptoms  notify office sooner      Elevated hemoglobin A1c      Updated Health Maintenance information Reviewed recent lab results with patient - A1c improved to 5.8, consistent with PreDM still but much improved - Hyponatremia - improved Na 134 - PSA was negative. 1.9 - Lipids controlled on Atorvastatin Encouraged improvement to lifestyle with diet and exercise Maintain healthy weight   No orders of the defined types were placed in this encounter.   Follow up plan: Return in about 3 months (around 10/06/2019) for 3 month HTN, Dementia neuro follow-up.  Nobie Putnam, [facility] 07/06/2019, 2:39 PM

## 2019-07-06 NOTE — Assessment & Plan Note (Signed)
Clinically with MCI memory loss Some gradual decline Previously worked up by [facility] with some component of REM sleep disorder as well. Was on Donepezil 5mg  previously, self discontinued thought was not effective, has been off >6+ months  They plan to now go to [facility] instead of back to [place] for now. Upcoming virtual apt.

## 2019-07-29 ENCOUNTER — Other Ambulatory Visit: Payer: Self-pay

## 2019-07-29 DIAGNOSIS — I10 Essential (primary) hypertension: Secondary | ICD-10-CM

## 2019-07-29 MED ORDER — LISINOPRIL 20 MG PO TABS: 20.0000 mg | ORAL_TABLET | Freq: Every day | ORAL | 1 refills | Status: DC

## 2019-07-29 NOTE — Telephone Encounter (Signed)
Copied from [place] [id number]. Topic: General - Other >> Jul 29, 2019  1:26 PM Yvette Rack wrote: Reason for CRM: Pt wife stated the Rx for lisinopril (ZESTRIL) 10 MG tablet was increased to 20 MG so they have been doubling the current Rx. Pt wife stated they need a new Rx for lisinopril (ZESTRIL) 20 MG tablet to be sent to [facility], [place]   Phone: [phone]  Fax: [phone]

## 2019-09-18 ENCOUNTER — Ambulatory Visit: Payer: Medicare Other | Admitting: Neurology

## 2019-10-15 ENCOUNTER — Ambulatory Visit (INDEPENDENT_AMBULATORY_CARE_PROVIDER_SITE_OTHER): Payer: Medicare Other | Admitting: Family Medicine

## 2019-10-15 ENCOUNTER — Other Ambulatory Visit: Payer: Self-pay

## 2019-10-15 ENCOUNTER — Encounter: Payer: Self-pay | Admitting: Family Medicine

## 2019-10-15 VITALS — BP 90/51 | HR 42 | Temp 97.1°F | Resp 16 | Ht 71.0 in | Wt 184.6 lb

## 2019-10-15 DIAGNOSIS — E871 Hypo-osmolality and hyponatremia: Secondary | ICD-10-CM

## 2019-10-15 DIAGNOSIS — R7309 Other abnormal glucose: Secondary | ICD-10-CM | POA: Diagnosis not present

## 2019-10-15 DIAGNOSIS — G3184 Mild cognitive impairment, so stated: Secondary | ICD-10-CM

## 2019-10-15 DIAGNOSIS — I10 Essential (primary) hypertension: Secondary | ICD-10-CM | POA: Diagnosis not present

## 2019-10-15 LAB — POCT GLYCOSYLATED HEMOGLOBIN (HGB A1C): Hemoglobin A1C: 5.7 % — AB (ref 4.0–5.6)

## 2019-10-15 NOTE — Assessment & Plan Note (Signed)
Stable chronic problem Episodic flares Last result near normal Continues with salt added diet

## 2019-10-15 NOTE — Patient Instructions (Addendum)
Thank you for coming to the office today.  Keep on track as we discussed. May adjust medicines if speak with Neurologist.  No changes today.  Recent Labs    07/01/19 0808 10/15/19 1157  HGBA1C 5.8* 5.7*     DUE for FASTING BLOOD WORK (no food or drink after midnight before the lab appointment, only water or coffee without cream/sugar on the morning of)  SCHEDULE "Lab Only" visit in the morning at the clinic for lab draw in 3 MONTHS   - Make sure Lab Only appointment is at about 1 week before your next appointment, so that results will be available  For Lab Results, once available within 2-3 days of blood draw, you can can log in to MyChart online to view your results and a brief explanation. Also, we can discuss results at next follow-up visit.   Please schedule a Follow-up Appointment to: Return in about 3 months (around 01/15/2020) for 3 month follow-up Memory.  If you have any other questions or concerns, please feel free to call the office or send a message through [place]. You may also schedule an earlier appointment if necessary.  Additionally, you may be receiving a survey about your experience at our office within a few days to 1 week by e-mail or mail. We value your feedback.  Nobie Putnam, DO [place]

## 2019-10-15 NOTE — Assessment & Plan Note (Signed)
Low BP now Off Lisinopril completely Has had fluctuation of BP Encourage close monitoring Improve hydration Caution with ambulation/balance

## 2019-10-15 NOTE — Assessment & Plan Note (Signed)
A1c improved to 5.7 Prior 5.8 Continue current plan, no treatment needed

## 2019-10-15 NOTE — Progress Notes (Signed)
Subjective:    Patient ID: Travis Craig, male    DOB: [birth year], 79 y.o.   MRN: [id number]  Travis Craig is a 79 y.o. male presenting on 10/15/2019 for Hypertension (3 month HTN, Dementia neuro follow-up.)  History provided primarily by wife, [name].  HPI  History of Hyponatremia Prior history of mild low sodium, has had to go to hospital in past recently for treatment. Drinks gatorade and water  Mild Cognitive Impairment / Mild Dementia Previously followed by [facility] Neurology Dr Delice Lesch. 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 He has seen Garment/textile technologist at [facility], neurologist works for [employer]. They performed cognitive testing on him to help understand diagnosis. He has remained off Donepezil >6+ months. Unsure if helpful. He was on Namenda 5mg  BID initially then Neuro reduced it to once daily now, still unable to determine if it is helping. Wife admits he can have more stress and pacing. They are using Trazodone daily for anxiety instead of sleep On MVI, Vitamin D He no longer drives Admits fall with balance issue Admits diarrhea once monthly some constipation   CHRONIC HTN: Update, Patient discontinued Lisinopril 20mg . Stopped due to low BP. Current Meds -None Reports good compliance, took meds today. Tolerating well, w/o complaints. Denies CP, dyspnea, HA, edema, dizziness / lightheadedness  Pre-Diabetes Due A1c today. Last was mild elevated.  Health Maintenance:  UTD [place] 04/23/19, 05/14/19   Depression screen [facility] 2/9 10/15/2019 01/06/2019 09/05/2017  Decreased Interest 0 0 1  Down, Depressed, Hopeless 0 0 0  PHQ - 2 Score 0 0 1   No flowsheet data found.   Social History   Tobacco Use  . Smoking status: Never Smoker  . Smokeless tobacco: Never Used  Vaping Use  . Vaping Use: Never used  Substance Use Topics  . Alcohol use: Yes     Alcohol/week: 2.0 standard drinks    Types: 2 Glasses of wine per week  . Drug use: No    Review of Systems Per HPI unless specifically indicated above     Objective:    BP (!) 90/51   Pulse (!) 42   Temp (!) 97.1 F (36.2 C) (Temporal)   Resp 16   Ht 5\' 11"  (1.803 m)   Wt 184 lb 9.6 oz (83.7 kg)   SpO2 100%   BMI 25.75 kg/m   Wt Readings from Last 3 Encounters:  10/15/19 184 lb 9.6 oz (83.7 kg)  07/06/19 186 lb (84.4 kg)  02/10/19 186 lb 4 oz (84.5 kg)    Physical Exam Vitals and nursing note reviewed.  Constitutional:      General: He is not in acute distress.    Appearance: He is well-developed. He is not diaphoretic.     Comments: Well-appearing, comfortable, cooperative  HENT:     Head: Normocephalic and atraumatic.  Eyes:     General:        Right eye: No discharge.        Left eye: No discharge.     Conjunctiva/sclera: Conjunctivae normal.     Pupils: Pupils are equal, round, and reactive to light.  Neck:     Thyroid: No thyromegaly.  Cardiovascular:     Rate and Rhythm: Normal rate and regular rhythm.     Heart sounds: Normal heart sounds. No murmur heard.   Pulmonary:     Effort: Pulmonary effort is normal. No  respiratory distress.     Breath sounds: Normal breath sounds. No wheezing or rales.  Abdominal:     General: Bowel sounds are normal. There is no distension.     Palpations: Abdomen is soft. There is no mass.     Tenderness: There is no abdominal tenderness.  Musculoskeletal:        General: No tenderness. Normal range of motion.     Cervical back: Normal range of motion and neck supple.     Right lower leg: Edema (+1 pitting varicose veins) present.     Left lower leg: Edema (non pitting edema, varicose veins) present.     Comments: Upper / Lower Extremities: - Normal muscle tone, strength bilateral upper extremities 5/5, lower extremities 5/5  Lymphadenopathy:     Cervical: No cervical adenopathy.  Skin:    General: Skin is warm and  dry.     Findings: No erythema or rash.  Neurological:     Mental Status: He is alert and oriented to person, place, and time.     Comments: Distal sensation intact to light touch all extremities  Psychiatric:        Behavior: Behavior normal.     Comments: Well groomed, good eye contact. His speech is normal when prompted but often is not actively following the entire conversation. Limited by hearing loss.      Recent Labs    07/01/19 0808 10/15/19 1157  HGBA1C 5.8* 5.7*     Results for orders placed or performed in visit on 10/15/19  POCT glycosylated hemoglobin (Hb A1C)  Result Value Ref Range   Hemoglobin A1C 5.7 (A) 4.0 - 5.6 %      Assessment & Plan:   Problem List Items Addressed This Visit    Mild cognitive impairment    Followed by [facility] now Clinically with [facility] memory loss. [facility] Some gradual decline still but seems fairly steady since last 3 months Remains off Donepezil On reduced Namenda, uncertain if benefit, discussed today on observing if helpful.  Offered CCM referral and Care Guides referral for community resources and other assistance coordination but declined at this time.      Hyponatremia    Stable chronic problem Episodic flares Last result near normal Continues with salt added diet      Essential hypertension - Primary    Low BP now Off Lisinopril completely Has had fluctuation of BP Encourage close monitoring Improve hydration Caution with ambulation/balance      Elevated hemoglobin A1c    A1c improved to 5.7 Prior 5.8 Continue current plan, no treatment needed      Relevant Orders   POCT glycosylated hemoglobin (Hb A1C) (Completed)      No orders of the defined types were placed in this encounter.  Neurologist in December 2021 / PCP in 2021  Encourage them to keep up with community plans with exercise class, computer class and brain activities   Follow up plan: Return in about 3 months  (around 01/15/2020) for 3 month follow-up Memory.  Nobie Putnam, [facility] 10/15/2019, 11:36 AM

## 2019-10-15 NOTE — Assessment & Plan Note (Addendum)
Followed by [facility] now Clinically with [facility]CI memory loss. [facility] Some gradual decline still but seems fairly steady since last 3 months Remains off Donepezil On reduced Namenda, uncertain if benefit, discussed today on observing if helpful.  Offered CCM referral and Care Guides referral for community resources and other assistance coordination but declined at this time.

## 2019-10-19 ENCOUNTER — Other Ambulatory Visit: Payer: Self-pay | Admitting: Family Medicine

## 2019-10-19 DIAGNOSIS — E782 Mixed hyperlipidemia: Secondary | ICD-10-CM

## 2019-12-22 ENCOUNTER — Ambulatory Visit: Payer: Self-pay

## 2020-01-13 ENCOUNTER — Other Ambulatory Visit: Payer: Medicare Other

## 2020-01-13 ENCOUNTER — Other Ambulatory Visit: Payer: Self-pay | Admitting: Family Medicine

## 2020-01-13 DIAGNOSIS — R7309 Other abnormal glucose: Secondary | ICD-10-CM | POA: Diagnosis not present

## 2020-01-13 DIAGNOSIS — I10 Essential (primary) hypertension: Secondary | ICD-10-CM | POA: Diagnosis not present

## 2020-01-13 DIAGNOSIS — E871 Hypo-osmolality and hyponatremia: Secondary | ICD-10-CM | POA: Diagnosis not present

## 2020-01-13 DIAGNOSIS — G3184 Mild cognitive impairment, so stated: Secondary | ICD-10-CM

## 2020-01-13 DIAGNOSIS — E538 Deficiency of other specified B group vitamins: Secondary | ICD-10-CM | POA: Diagnosis not present

## 2020-01-14 ENCOUNTER — Other Ambulatory Visit: Payer: Self-pay

## 2020-01-14 LAB — CBC WITH DIFFERENTIAL/PLATELET
Absolute Monocytes: 418 cells/uL (ref 200–950)
Basophils Absolute: 30 cells/uL (ref 0–200)
Basophils Relative: 0.8 %
Eosinophils Absolute: 141 cells/uL (ref 15–500)
Eosinophils Relative: 3.7 %
HCT: 37.6 % — ABNORMAL LOW (ref 38.5–50.0)
Hemoglobin: 12.9 g/dL — ABNORMAL LOW (ref 13.2–17.1)
Lymphs Abs: 1022 cells/uL (ref 850–3900)
MCH: 35 pg — ABNORMAL HIGH (ref 27.0–33.0)
MCHC: 34.3 g/dL (ref 32.0–36.0)
MCV: 101.9 fL — ABNORMAL HIGH (ref 80.0–100.0)
MPV: 9.7 fL (ref 7.5–12.5)
Monocytes Relative: 11 %
Neutro Abs: 2189 cells/uL (ref 1500–7800)
Neutrophils Relative %: 57.6 %
Platelets: 144 10*3/uL (ref 140–400)
RBC: 3.69 10*6/uL — ABNORMAL LOW (ref 4.20–5.80)
RDW: 11.7 % (ref 11.0–15.0)
Total Lymphocyte: 26.9 %
WBC: 3.8 10*3/uL (ref 3.8–10.8)

## 2020-01-14 LAB — BASIC METABOLIC PANEL WITH GFR
BUN: 17 mg/dL (ref 7–25)
CO2: 27 mmol/L (ref 20–32)
Calcium: 9.4 mg/dL (ref 8.6–10.3)
Chloride: 100 mmol/L (ref 98–110)
Creat: 0.99 mg/dL (ref 0.70–1.11)
GFR, Est African American: 83 mL/min/{1.73_m2} (ref >=60)
GFR, Est Non African American: 72 mL/min/{1.73_m2} (ref >=60)
Glucose, Bld: 97 mg/dL (ref 65–99)
Potassium: 4.2 mmol/L (ref 3.5–5.3)
Sodium: 134 mmol/L — ABNORMAL LOW (ref 135–146)

## 2020-01-14 LAB — HEMOGLOBIN A1C
Hgb A1c MFr Bld: 5.9 % of total Hgb — ABNORMAL HIGH (ref <5.7)
Mean Plasma Glucose: 123 (calc)
eAG (mmol/L): 6.8 (calc)

## 2020-01-14 LAB — VITAMIN B12: Vitamin B-12: 485 pg/mL (ref 200–1100)

## 2020-01-18 ENCOUNTER — Ambulatory Visit (INDEPENDENT_AMBULATORY_CARE_PROVIDER_SITE_OTHER): Payer: Medicare Other | Admitting: Family Medicine

## 2020-01-18 ENCOUNTER — Encounter: Payer: Self-pay | Admitting: Family Medicine

## 2020-01-18 ENCOUNTER — Other Ambulatory Visit: Payer: Self-pay

## 2020-01-18 VITALS — BP 133/75 | HR 44 | Temp 97.3°F | Resp 16 | Ht 71.0 in | Wt 184.6 lb

## 2020-01-18 DIAGNOSIS — F0390 Unspecified dementia without behavioral disturbance: Secondary | ICD-10-CM | POA: Diagnosis not present

## 2020-01-18 DIAGNOSIS — I10 Essential (primary) hypertension: Secondary | ICD-10-CM | POA: Diagnosis not present

## 2020-01-18 MED ORDER — SERTRALINE HCL 50 MG PO TABS: 50.0000 mg | ORAL_TABLET | Freq: Every day | ORAL | 1 refills | Status: DC

## 2020-01-18 NOTE — Progress Notes (Signed)
Subjective:    Patient ID: Travis Craig, male    DOB: 04-20-1940, 79 y.o.   MRN: [id number]  Travis Craig is a 79 y.o. male presenting on 01/18/2020 for Memory Loss  History provided primarily by wife, Travis Craig.   HPI   History of Hyponatremia Prior history of mild low sodium Last lab showed stable mild low sodium 134 Drinks gatorade and water  Dementia Chronic problem Previously followed by [facility] Neurology Dr Travis Craig. 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 They performed cognitive testing on him to help understand diagnosis. He has been on Aricept, now Namenda and Trazodone. Travis Craig says she thinks he is doing worse on the medicine, difficult to get back into Neuro [place] Concern that medicine is affecting him adversely, they have already reduced dose of Namenda from 5mg  BID down to 1 tab daily - He had recent episode of thinking closet was a shower - Also Trazodone for anxiety but says this is not working. He takes in AM for anxiety but not for sleep or mood - Now not comfortable walking on his own, uses walking stick, 1 year ago was using, he was more active previously On MVI, Vitamin D He no longer drives   CHRONIC HTN: Off Lisinopril previously, now not on any HTN med  Update, Patient discontinued Lisinopril 20mg . Stopped due to low BP. Current Meds -None Reports good compliance, took meds today. Tolerating well, w/o complaints. Denies CP, dyspnea, HA, edema, dizziness / lightheadedness  Pre-Diabetes Last lab showed A1c 5.9, had been steady at 5.7 to 5.8 prior  Health Maintenance:  UTD [place] 04/23/19, 05/14/19  Depression screen [facility] 2/9 10/15/2019 01/06/2019 09/05/2017  Decreased Interest 0 0 1  Down, Depressed, Hopeless 0 0 0  PHQ - 2 Score 0 0 1    Social History   Tobacco Use  . Smoking status:  Never Smoker  . Smokeless tobacco: Never Used  Vaping Use  . Vaping Use: Never used  Substance Use Topics  . Alcohol use: Yes    Alcohol/week: 2.0 standard drinks    Types: 2 Glasses of wine per week  . Drug use: No    Review of Systems Per HPI unless specifically indicated above     Objective:    BP 133/75   Pulse (!) 44   Temp (!) 97.3 F (36.3 C) (Temporal)   Resp 16   Ht 5\' 11"  (1.803 m)   Wt 184 lb 9.6 oz (83.7 kg)   SpO2 100%   BMI 25.75 kg/m   Wt Readings from Last 3 Encounters:  01/18/20 184 lb 9.6 oz (83.7 kg)  10/15/19 184 lb 9.6 oz (83.7 kg)  07/06/19 186 lb (84.4 kg)    Physical Exam Vitals and nursing note reviewed.  Constitutional:      General: He is not in acute distress.    Appearance: He is well-developed. He is not diaphoretic.     Comments: Well appearing elderly 79 yr male, comfortable, cooperative  HENT:     Head: Normocephalic and atraumatic.  Eyes:     General:        Right eye: No discharge.        Left eye: No discharge.     Conjunctiva/sclera: Conjunctivae normal.  Cardiovascular:     Rate and Rhythm: Normal rate.  Pulmonary:     Effort: Pulmonary effort  is normal.  Skin:    General: Skin is warm and dry.     Findings: No erythema or rash.  Neurological:     Mental Status: He is alert and oriented to person, place, and time.     Comments: He may have some normal speech at time when prompted, but poor participation in conversation, limited by hearing loss, and not following.   Psychiatric:        Behavior: Behavior normal.     Comments: Well groomed, good eye contact, normal speech and thoughts      Results for orders placed or performed in visit on 01/13/20  Hemoglobin A1c  Result Value Ref Range   Hgb A1c MFr Bld 5.9 (H) <5.7 % of total Hgb   Mean Plasma Glucose 123 (calc)   eAG (mmol/L) 6.8 (calc)  BASIC METABOLIC PANEL WITH GFR  Result Value Ref Range   Glucose, Bld 97 65 - 99 mg/dL   BUN 17 7 - 25 mg/dL   Creat  0.99 0.70 - 1.11 mg/dL   GFR, Est Non African American 72 > OR = 60 mL/min/1.47m2   GFR, Est African American 83 > OR = 60 mL/min/1.18m2   BUN/Creatinine Ratio NOT APPLICABLE 6 - 22 (calc)   Sodium 134 (L) 135 - 146 mmol/L   Potassium 4.2 3.5 - 5.3 mmol/L   Chloride 100 98 - 110 mmol/L   CO2 27 20 - 32 mmol/L   Calcium 9.4 8.6 - 10.3 mg/dL  Vitamin B12  Result Value Ref Range   Vitamin B-12 485 200 - 1,100 pg/mL  CBC with Differential/Platelet  Result Value Ref Range   WBC 3.8 3.8 - 10.8 Thousand/uL   RBC 3.69 (L) 4.20 - 5.80 Million/uL   Hemoglobin 12.9 (L) 13.2 - 17.1 g/dL   HCT 37.6 (L) 38 - 50 %   MCV 101.9 (H) 80.0 - 100.0 fL   MCH 35.0 (H) 27.0 - 33.0 pg   MCHC 34.3 32.0 - 36.0 g/dL   RDW 11.7 11.0 - 15.0 %   Platelets 144 140 - 400 Thousand/uL   MPV 9.7 7.5 - 12.5 fL   Neutro Abs 2,189 1,500 - 7,800 cells/uL   Lymphs Abs 1,022 850 - 3,900 cells/uL   Absolute Monocytes 418 200 - 950 cells/uL   Eosinophils Absolute 141 15 - 500 cells/uL   Basophils Absolute 30 0 - 200 cells/uL   Neutrophils Relative % 57.6 %   Total Lymphocyte 26.9 %   Monocytes Relative 11.0 %   Eosinophils Relative 3.7 %   Basophils Relative 0.8 %      Assessment & Plan:   Problem List Items Addressed This Visit    Dementia ([place]) - Primary    Followed by [facility] Neurology Clinically with progressive memory loss and behavioral change Prior cognitive testing from [place] [facility] Has failed Aricept, Namenda On Trazodone for anxiety but seems ineffective Uncertain benefit on meds, concern for adverse outcome  Will make changes to meds today and [facility] - Taper down off Namenda from 5mg  daily down to half tab and off over 2-4 weeks - Taper down on Trazodone 25mg  (half tab dose) over 1-2 weeks then off START Sertraline 50mg  daily once off other meds, can help mood stabilize and help anxiety      Relevant Medications   sertraline (ZOLOFT) 50 MG tablet       Meds  ordered this encounter  Medications  . sertraline (  ZOLOFT) 50 MG tablet    Sig: Take 1 tablet (50 mg total) by mouth daily.    Dispense:  90 tablet    Refill:  1     Follow up plan: Return in about 2 months (around 03/19/2020) for 2 month follow-up Dementia, Anxiety.   Nobie Putnam, DO [facility] 01/18/2020, 11:56 AM

## 2020-01-18 NOTE — Assessment & Plan Note (Signed)
Followed by [facility] Neurology Clinically with progressive memory loss and behavioral change Prior cognitive testing from [place] [facility] Has failed Aricept, Namenda On Trazodone for anxiety but seems ineffective Uncertain benefit on meds, concern for adverse outcome  Will make changes to meds today and [facility] - Taper down off Namenda from 5mg  daily down to half tab and off over 2-4 weeks - Taper down on Trazodone 25mg  (half tab dose) over 1-2 weeks then off START Sertraline 50mg  daily once off other meds, can help mood stabilize and help anxiety

## 2020-01-18 NOTE — Patient Instructions (Addendum)
Thank you for coming to the office today.  Namend (Memantine) - 5mg  tab  Medication Taper Off Instructions  Week 1-2: HALF tab for dose 2.5mg  every day Week 3-4: 2.5mg  HALF tab - every OTHER day  Discontinue off after 4 week  --------------------------------------  Trazodone half pill 25mg  dose every OTHER day for 1-2 weeks. And then discontinue.  --------------------------------------  After about 1 month, then you should be off medications can Start Sertraline 50mg  daily sent express scripts   Please schedule a Follow-up Appointment to: Return in about 2 months (around 03/19/2020) for 2 month follow-up Dementia, Anxiety.  If you have any other questions or concerns, please feel free to call the office or send a message through [place]. You may also schedule an earlier appointment if necessary.  Additionally, you may be receiving a survey about your experience at our office within a few days to 1 week by e-mail or mail. We value your feedback.  Nobie Putnam, DO [place]

## 2020-01-21 DIAGNOSIS — Z23 Encounter for immunization: Secondary | ICD-10-CM | POA: Diagnosis not present

## 2020-02-05 DIAGNOSIS — Z23 Encounter for immunization: Secondary | ICD-10-CM | POA: Diagnosis not present

## 2020-03-25 ENCOUNTER — Encounter: Payer: Self-pay | Admitting: Family Medicine

## 2020-03-25 ENCOUNTER — Other Ambulatory Visit: Payer: Self-pay

## 2020-03-25 ENCOUNTER — Ambulatory Visit (INDEPENDENT_AMBULATORY_CARE_PROVIDER_SITE_OTHER): Payer: Medicare Other | Admitting: Family Medicine

## 2020-03-25 VITALS — BP 142/72 | HR 42 | Temp 97.3°F | Resp 16 | Ht 71.0 in | Wt 184.0 lb

## 2020-03-25 DIAGNOSIS — G309 Alzheimer's disease, unspecified: Secondary | ICD-10-CM

## 2020-03-25 DIAGNOSIS — F0280 Dementia in other diseases classified elsewhere without behavioral disturbance: Secondary | ICD-10-CM

## 2020-03-25 NOTE — Patient Instructions (Addendum)
Thank you for coming to the office today.  Medication Taper Off Instructions Current med - Sertraline 50mg   Week 1: Alternate every OTHER day - 50 mg (whole tab) and then next day 25 mg (HALF tab) Week 2-3: 25 mg daily (HALF tablet) Week 4: 25 mg every OTHER day (HALF tablet) - alternating day is NO medicine (skip dose) STOP completely  Optional weeks 5-6: you may extend the week 4 plan for up to 3 weeks if need. Alternating half pill one day and no pill the next. May space out to every 3 days if need.  ---------------  Overall keep on the right track as long as we are safe. If increasing risk of harm or safety to him or others we may need more caregiver support.   Dr Delice Lesch if you need to speak with Neurologist again in future for additional advice  [facility] Neurology [address] [place], [place] [address] Phone: [phone]   Please schedule a Follow-up Appointment to: Return in about 4 months (around 07/24/2020) for 4 month follow-up Dementia, BP.  If you have any other questions or concerns, please feel free to call the office or send a message through [place]. You may also schedule an earlier appointment if necessary.  Additionally, you may be receiving a survey about your experience at our office within a few days to 1 week by e-mail or mail. We value your feedback.  Nobie Putnam, DO [place]

## 2020-03-25 NOTE — Progress Notes (Signed)
Subjective:    Patient ID: Travis Craig, male    DOB: [birth year], 79 y.o.   MRN: [id number]  Travis Craig is a 79 y.o. male presenting on 03/25/2020 for Dementia (Getting worst follow up after med change)  History provided by patient's wife, [name].  HPI   Alzheimer's Dementia Previously followed by [facility] Neurology Dr Delice Lesch. 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 They performed cognitive testing on him to help understand diagnosis. He has been on Aricept, now Namenda and Trazodone. [name] says she thinks he is doing worse on the medicine, difficult to get back into Neuro [place] Concern that medicine is affecting him adversely, they have already reduced dose of Namenda from 5mg  BID down to 1 tab daily  Interval update, last visit he was taken off Trazodone since caused side effect. Then we initiated sertraline for mood stability and possible reduce some agitation. He has taken 50mg  daily for about 2 months - Sertraline is not working well. Causing agitation and side effects. Concern with hypersomnia and sleeping too much.  Agitation and frustration with things he can no longer do, or technology issues. Closet organization, often putting things back in the wrong place. He needs help with choosing clothes to wear Difficulty with recognizing things. He goes to get something then sees it but doesn't recognize it. Difficulty writing Christmas cards. Difficulty with balance, risk of fall. Standing up quickly. He used to walk more. Appetite is good overall. But prefers to eat less at one time. Hygiene is pretty good often showers. Not always shaving. Now not sure about Neurologist. Medicines unsuccessful. Not sure what else to do. She is in contact with Museum/gallery curator and [place].  Has failed Aricept, Namenda, Trazodone,  Sertraline Uncertain benefit on meds, concern for adverse outcome   Depression screen [facility] 2/9 03/25/2020 10/15/2019 01/06/2019  Decreased Interest 0 0 0  Down, Depressed, Hopeless 0 0 0  PHQ - 2 Score 0 0 0    Social History   Tobacco Use  . Smoking status: Never Smoker  . Smokeless tobacco: Never Used  Vaping Use  . Vaping Use: Never used  Substance Use Topics  . Alcohol use: Yes    Alcohol/week: 2.0 standard drinks    Types: 2 Glasses of wine per week  . Drug use: No    Review of Systems Per HPI unless specifically indicated above     Objective:    BP (!) 142/72   Pulse (!) 42   Temp (!) 97.3 F (36.3 C) (Temporal)   Resp 16   Ht 5\' 11"  (1.803 m)   Wt 184 lb (83.5 kg)   SpO2 100%   BMI 25.66 kg/m   Wt Readings from Last 3 Encounters:  03/25/20 184 lb (83.5 kg)  01/18/20 184 lb 9.6 oz (83.7 kg)  10/15/19 184 lb 9.6 oz (83.7 kg)    Physical Exam Vitals and nursing note reviewed.  Constitutional:      General: He is not in acute distress.    Appearance: He is well-developed. He is not diaphoretic.     Comments: Well appearing elderly 79 yr male, comfortable, cooperative  HENT:     Head: Normocephalic and atraumatic.  Eyes:     General:        Right eye: No discharge.        Left eye: No  discharge.     Conjunctiva/sclera: Conjunctivae normal.  Cardiovascular:     Rate and Rhythm: Normal rate.  Pulmonary:     Effort: Pulmonary effort is normal.  Skin:    General: Skin is warm and dry.     Findings: No erythema or rash.  Neurological:     Mental Status: He is alert and oriented to person, place, and time.     Comments: He may have some normal speech at time when prompted, but poor participation in conversation, limited by hearing loss, and not following.   Psychiatric:        Behavior: Behavior normal.     Comments: Well groomed, good eye contact, normal speech and thoughts    Results for orders placed or performed in visit on 01/13/20  Hemoglobin  A1c  Result Value Ref Range   Hgb A1c MFr Bld 5.9 (H) <5.7 % of total Hgb   Mean Plasma Glucose 123 (calc)   eAG (mmol/L) 6.8 (calc)  BASIC METABOLIC PANEL WITH GFR  Result Value Ref Range   Glucose, Bld 97 65 - 99 mg/dL   BUN 17 7 - 25 mg/dL   Creat 0.99 0.70 - 1.11 mg/dL   GFR, Est Non African American 72 > OR = 60 mL/min/1.18m2   GFR, Est African American 83 > OR = 60 mL/min/1.26m2   BUN/Creatinine Ratio NOT APPLICABLE 6 - 22 (calc)   Sodium 134 (L) 135 - 146 mmol/L   Potassium 4.2 3.5 - 5.3 mmol/L   Chloride 100 98 - 110 mmol/L   CO2 27 20 - 32 mmol/L   Calcium 9.4 8.6 - 10.3 mg/dL  Vitamin B12  Result Value Ref Range   Vitamin B-12 485 200 - 1,100 pg/mL  CBC with Differential/Platelet  Result Value Ref Range   WBC 3.8 3.8 - 10.8 Thousand/uL   RBC 3.69 (L) 4.20 - 5.80 Million/uL   Hemoglobin 12.9 (L) 13.2 - 17.1 g/dL   HCT 37.6 (L) 38.5 - 50.0 %   MCV 101.9 (H) 80.0 - 100.0 fL   MCH 35.0 (H) 27.0 - 33.0 pg   MCHC 34.3 32.0 - 36.0 g/dL   RDW 11.7 11.0 - 15.0 %   Platelets 144 140 - 400 Thousand/uL   MPV 9.7 7.5 - 12.5 fL   Neutro Abs 2,189 1,500 - 7,800 cells/uL   Lymphs Abs 1,022 850 - 3,900 cells/uL   Absolute Monocytes 418 200 - 950 cells/uL   Eosinophils Absolute 141 15 - 500 cells/uL   Basophils Absolute 30 0 - 200 cells/uL   Neutrophils Relative % 57.6 %   Total Lymphocyte 26.9 %   Monocytes Relative 11.0 %   Eosinophils Relative 3.7 %   Basophils Relative 0.8 %      Assessment & Plan:   Problem List Items Addressed This Visit    Dementia ([place]) - Primary    Followed by [facility] Neurology - has not returned Clinically with progressive memory loss and behavioral change Prior cognitive testing from [place] [facility] Has failed Aricept, Namenda, Trazodone, Sertraline Uncertain benefit on meds, concern for adverse outcome  TAPER instructions given for Sertraline. Patient's wife/caregiver opts to trial OFF medication for now. Advised her that I have limited other  med options at this point, there are some psych medications that can be used only temporarily for anxiety but likely he would need consultation with neurology again or possibly palliative care in future for symptom management Counseling on home safety and management of patient with  dementia, at this point she does not need extra caregiver support, discussed resources [place], case management/social work CCM if needed in future         No orders of the defined types were placed in this encounter.    Follow up plan: Return in about 4 months (around 07/24/2020) for 4 month follow-up Dementia, BP.   A total of 35 minutes was spent face-to-face with this patient. Greater than 50% of this time (approximately 25 minutes) was spent in counseling on dementia symptoms, treatment management, home caregiver strategy   Nobie Putnam, [facility] 03/25/2020, 1:37 PM

## 2020-03-26 NOTE — Assessment & Plan Note (Addendum)
Followed by [facility] Neurology - has not returned Clinically with progressive memory loss and behavioral change Prior cognitive testing from [place] [facility] Has failed Aricept, Namenda, Trazodone, Sertraline Uncertain benefit on meds, concern for adverse outcome  TAPER instructions given for Sertraline. Patient's wife/caregiver opts to trial OFF medication for now. Advised her that I have limited other med options at this point, there are some psych medications that can be used only temporarily for anxiety but likely he would need consultation with neurology again or possibly palliative care in future for symptom management Counseling on home safety and management of patient with dementia, at this point she does not need extra caregiver support, discussed resources [place], case management/social work CCM if needed in future

## 2020-04-02 ENCOUNTER — Emergency Department
Admission: EM | Admit: 2020-04-02 | Discharge: 2020-04-02 | Disposition: A | Discharge status: Home / Self Care | Payer: Medicare Other | Source: Home / Self Care | Attending: Emergency Medicine | Admitting: Emergency Medicine

## 2020-04-02 ENCOUNTER — Other Ambulatory Visit: Payer: Self-pay

## 2020-04-02 ENCOUNTER — Emergency Department: Payer: Medicare Other

## 2020-04-02 DIAGNOSIS — I9589 Other hypotension: Secondary | ICD-10-CM

## 2020-04-02 DIAGNOSIS — A09 Infectious gastroenteritis and colitis, unspecified: Secondary | ICD-10-CM | POA: Insufficient documentation

## 2020-04-02 DIAGNOSIS — E860 Dehydration: Secondary | ICD-10-CM | POA: Diagnosis not present

## 2020-04-02 DIAGNOSIS — R197 Diarrhea, unspecified: Secondary | ICD-10-CM | POA: Diagnosis not present

## 2020-04-02 DIAGNOSIS — W19XXXA Unspecified fall, initial encounter: Secondary | ICD-10-CM

## 2020-04-02 DIAGNOSIS — S0231XA Fracture of orbital floor, right side, initial encounter for closed fracture: Secondary | ICD-10-CM | POA: Diagnosis not present

## 2020-04-02 DIAGNOSIS — S0181XA Laceration without foreign body of other part of head, initial encounter: Secondary | ICD-10-CM | POA: Diagnosis not present

## 2020-04-02 DIAGNOSIS — S0511XA Contusion of eyeball and orbital tissues, right eye, initial encounter: Secondary | ICD-10-CM | POA: Diagnosis not present

## 2020-04-02 DIAGNOSIS — S0281XA Fracture of other specified skull and facial bones, right side, initial encounter for closed fracture: Secondary | ICD-10-CM | POA: Diagnosis not present

## 2020-04-02 DIAGNOSIS — I248 Other forms of acute ischemic heart disease: Secondary | ICD-10-CM | POA: Diagnosis not present

## 2020-04-02 DIAGNOSIS — Z043 Encounter for examination and observation following other accident: Secondary | ICD-10-CM | POA: Diagnosis not present

## 2020-04-02 DIAGNOSIS — I214 Non-ST elevation (NSTEMI) myocardial infarction: Secondary | ICD-10-CM | POA: Diagnosis not present

## 2020-04-02 DIAGNOSIS — D1779 Benign lipomatous neoplasm of other sites: Secondary | ICD-10-CM | POA: Diagnosis not present

## 2020-04-02 DIAGNOSIS — D170 Benign lipomatous neoplasm of skin and subcutaneous tissue of head, face and neck: Secondary | ICD-10-CM | POA: Diagnosis not present

## 2020-04-02 DIAGNOSIS — I10 Essential (primary) hypertension: Secondary | ICD-10-CM | POA: Insufficient documentation

## 2020-04-02 DIAGNOSIS — Z20822 Contact with and (suspected) exposure to covid-19: Secondary | ICD-10-CM | POA: Insufficient documentation

## 2020-04-02 DIAGNOSIS — Z7982 Long term (current) use of aspirin: Secondary | ICD-10-CM | POA: Insufficient documentation

## 2020-04-02 DIAGNOSIS — R001 Bradycardia, unspecified: Secondary | ICD-10-CM | POA: Diagnosis not present

## 2020-04-02 DIAGNOSIS — R102 Pelvic and perineal pain: Secondary | ICD-10-CM | POA: Diagnosis not present

## 2020-04-02 DIAGNOSIS — G9389 Other specified disorders of brain: Secondary | ICD-10-CM | POA: Diagnosis not present

## 2020-04-02 DIAGNOSIS — I959 Hypotension, unspecified: Secondary | ICD-10-CM | POA: Diagnosis not present

## 2020-04-02 DIAGNOSIS — S0990XA Unspecified injury of head, initial encounter: Secondary | ICD-10-CM | POA: Diagnosis not present

## 2020-04-02 DIAGNOSIS — F0390 Unspecified dementia without behavioral disturbance: Secondary | ICD-10-CM | POA: Insufficient documentation

## 2020-04-02 DIAGNOSIS — I517 Cardiomegaly: Secondary | ICD-10-CM | POA: Diagnosis not present

## 2020-04-02 LAB — COMPREHENSIVE METABOLIC PANEL
ALT: 23 U/L (ref 0–44)
AST: 31 U/L (ref 15–41)
Albumin: 3.8 g/dL (ref 3.5–5.0)
Alkaline Phosphatase: 59 U/L (ref 38–126)
Anion gap: 11 (ref 5–15)
BUN: 19 mg/dL (ref 8–23)
CO2: 23 mmol/L (ref 22–32)
Calcium: 8.9 mg/dL (ref 8.9–10.3)
Chloride: 101 mmol/L (ref 98–111)
Creatinine, Ser: 0.99 mg/dL (ref 0.61–1.24)
GFR, Estimated: >60 mL/min (ref >60)
Glucose, Bld: 134 mg/dL — ABNORMAL HIGH (ref 70–99)
Potassium: 3.8 mmol/L (ref 3.5–5.1)
Sodium: 135 mmol/L (ref 135–145)
Total Bilirubin: 1.6 mg/dL — ABNORMAL HIGH (ref 0.3–1.2)
Total Protein: 7.1 g/dL (ref 6.5–8.1)

## 2020-04-02 LAB — URINALYSIS, COMPLETE (UACMP) WITH MICROSCOPIC
Bacteria, UA: NONE SEEN
Bilirubin Urine: NEGATIVE
Glucose, UA: NEGATIVE mg/dL
Ketones, ur: NEGATIVE mg/dL
Leukocytes,Ua: NEGATIVE
Nitrite: NEGATIVE
Protein, ur: NEGATIVE mg/dL
Specific Gravity, Urine: 1.013 (ref 1.005–1.030)
Squamous Epithelial / HPF: NONE SEEN (ref 0–5)
pH: 5 (ref 5.0–8.0)

## 2020-04-02 LAB — CBC WITH DIFFERENTIAL/PLATELET
Abs Immature Granulocytes: 0.03 10*3/uL (ref 0.00–0.07)
Basophils Absolute: 0 10*3/uL (ref 0.0–0.1)
Basophils Relative: 0 %
Eosinophils Absolute: 0.1 10*3/uL (ref 0.0–0.5)
Eosinophils Relative: 1 %
HCT: 39.4 % (ref 39.0–52.0)
Hemoglobin: 13.6 g/dL (ref 13.0–17.0)
Immature Granulocytes: 0 %
Lymphocytes Relative: 3 %
Lymphs Abs: 0.2 10*3/uL — ABNORMAL LOW (ref 0.7–4.0)
MCH: 35.3 pg — ABNORMAL HIGH (ref 26.0–34.0)
MCHC: 34.5 g/dL (ref 30.0–36.0)
MCV: 102.3 fL — ABNORMAL HIGH (ref 80.0–100.0)
Monocytes Absolute: 0.4 10*3/uL (ref 0.1–1.0)
Monocytes Relative: 6 %
Neutro Abs: 6.2 10*3/uL (ref 1.7–7.7)
Neutrophils Relative %: 90 %
Platelets: 126 10*3/uL — ABNORMAL LOW (ref 150–400)
RBC: 3.85 MIL/uL — ABNORMAL LOW (ref 4.22–5.81)
RDW: 11.9 % (ref 11.5–15.5)
WBC: 7 10*3/uL (ref 4.0–10.5)
nRBC: 0 % (ref 0.0–0.2)

## 2020-04-02 LAB — RESP PANEL BY RT-PCR (FLU A&B, COVID) ARPGX2
Influenza A by PCR: NEGATIVE
Influenza B by PCR: NEGATIVE
SARS Coronavirus 2 by RT PCR: NEGATIVE

## 2020-04-02 MED ORDER — LACTATED RINGERS IV BOLUS
1000.0000 mL | Freq: Once | INTRAVENOUS | Status: AC
  Administered 2020-04-02: 06:00:00 1000 mL via INTRAVENOUS

## 2020-04-02 NOTE — ED Provider Notes (Signed)
  Patient received in signout from Dr. Alfred Levins for evaluation of multiple episodes of diarrhea, dehydration and low blood pressure.  CT head reviewed without evidence of acute cranial pathology.  Viral panel is negative and patient reports improving symptoms after IV fluids.  Subsequently tolerating p.o. intake and has no evidence of further pathology to preclude outpatient management.  His abdomen remains benign upon my reassessment and I discussed outpatient management with his wife, who is his caregiver.  We discussed return precautions for the ED and patient is medically stable for discharge home.   Vladimir Crofts, MD 04/02/20 [phone]

## 2020-04-02 NOTE — ED Triage Notes (Signed)
Pt BIB EMS from home. Per EMS per patient's wife, pt has been experiencing diarrhea and dehydration x1 day. Went to bathroom tonight and then was unable to stand to get off toilet. Attempted several times with EMS to stand without success. EMS gave a 519mL bolus of normal saline IV and blood pressure was 78/48 after bolus, did not get blood pressure prior. On arrival to ED, pt BP 110/66. Pt has no medical complaints, hx of dementia per EMS per wife, pt alert and oriented x4 at this time. Pt states that he fell tonight, however EMS states that pt was still on toilet when EMS arrived. Swelling noted to left forehead without bruising or redness, pt unable to state if this is new or not.  Sinus brady with EMS on monitor, blood glucose 182 with EMS.

## 2020-04-02 NOTE — ED Provider Notes (Signed)
[facility] Emergency Department Provider Note  ____________________________________________  Time seen: Approximately 6:04 AM  I have reviewed the triage vital signs and the nursing notes.   HISTORY  Chief Complaint Hypotension   HPI Travis Craig is a 79 y.o. male patient with a history of dementia, hyponatremia, bradycardia, anemia, hypertension, hyperlipidemia who presents for evaluation of diarrhea and falls.  History is gathered mostly from patient's wife was at bedside.  According to her patient had several episodes of diarrhea overnight.  No melena or bright red blood per rectum.  She reports that he had 2 falls because he was feeling very weak after these bowel movements.  Within one of them he did not hit his head.  He does take a baby aspirin but no other blood thinners.  The most recent episode of diarrhea patient felt very weak and he was unable to stand up from the toilet when EMS was called.  Patient was found to have a BP of 78/48 and received 500 cc in route.  Patient denies headache, neck pain, chest pain, abdominal pain, shortness of breath, hip pain, back pain.  He reports feeling in his usual state of health.  Wife who lives with him has not been sick or had any similar symptoms.  He has had no nausea or vomiting.  No known Covid or flu exposures.  Patient is fully vaccinated including boosters.   No recent antibiotic use or history of C. difficile.  Past Medical History:  Diagnosis Date  . Chicken pox   . Hearing loss   . Hyperlipidemia   . Hypertension   . Ulcer     Patient Active Problem List   Diagnosis Date Noted  . Elevated hemoglobin A1c 07/06/2019  . Dementia ([place]) 01/07/2019  . 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 Hyponatremia 05/13/2017  . 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 Anemia 03/22/2017  . Essential hypertension 02/29/2016  . Mixed hyperlipidemia 02/29/2016    Past  Surgical History:  Procedure Laterality Date  . BLEPHAROPLASTY    . CHOLECYSTECTOMY    . Ulcer surgery      Prior to Admission medications   Medication Sig Start Date End Date Taking? Authorizing Provider  albuterol (VENTOLIN HFA) 108 (90 Base) MCG/ACT inhaler Inhale 2 puffs into the lungs every 4 (four) hours as needed.    [provider]  aspirin EC 81 MG tablet Take 81 mg by mouth daily.    [provider]  atorvastatin (LIPITOR) 20 MG tablet TAKE 1 TABLET DAILY 10/20/19   Karamalegos, Devonne Doughty, DO  calcium-vitamin D (OSCAL WITH D) 500-200 MG-UNIT tablet Take 1 tablet by mouth.    [provider]  Coenzyme Q10 (COQ10 PO) Take by mouth.     [provider]  fluticasone (FLONASE) 50 MCG/ACT nasal spray Place 1 spray into both nostrils 2 (two) times daily. 06/19/19   [provider]  ipratropium (ATROVENT) 0.06 % nasal spray Place 2 sprays into both nostrils 4 (four) times daily as needed for rhinitis. 05/19/19   Karamalegos, Devonne Doughty, DO  Multiple Vitamin (MULTI-VITAMIN DAILY PO) Take by mouth.    [provider]  Omega-3 1400 MG CAPS Take 1,600 mg by mouth.     [provider]    Allergies Patient has no known allergies.  Family History  Problem Relation Age of Onset  . Hypertension Father   . Alzheimer's disease Father     Social  History Social History   Tobacco Use  . Smoking status: Never Smoker  . Smokeless tobacco: Never Used  Vaping Use  . Vaping Use: Never used  Substance Use Topics  . Alcohol use: Yes    Alcohol/week: 2.0 standard drinks    Types: 2 Glasses of wine per week  . Drug use: No    Review of Systems  Constitutional: Negative for fever. + head trauma and generalized weakness Eyes: Negative for visual changes. ENT: Negative for sore throat. Neck: No neck pain  Cardiovascular: Negative for chest pain. Respiratory: Negative for shortness of breath. Gastrointestinal: Negative for  abdominal pain, vomiting. + diarrhea. Genitourinary: Negative for dysuria. Musculoskeletal: Negative for back pain. Skin: Negative for rash. Neurological: Negative for headaches, weakness or numbness. Psych: No SI or HI  ____________________________________________   PHYSICAL EXAM:  VITAL SIGNS: ED Triage Vitals  Enc Vitals Group     BP 04/02/20 0542 110/66     Pulse Rate 04/02/20 0542 (!) 50     Resp 04/02/20 0542 16     Temp 04/02/20 0542 (!) 97.5 F (36.4 C)     Temp Source 04/02/20 0542 Oral     SpO2 04/02/20 0542 98 %     Weight 04/02/20 0543 184 lb 1.4 oz (83.5 kg)     Height 04/02/20 0543 5\' 11"  (1.803 m)     Head Circumference --      Peak Flow --      Pain Score 04/02/20 0542 0     Pain Loc --      Pain Edu? --      Excl. in [place]? --     Constitutional: Alert and oriented. Well appearing and in no apparent distress. HEENT:      Head: Normocephalic and atraumatic.         Eyes: Conjunctivae are normal. Sclera is non-icteric.       Mouth/Throat: Mucous membranes are moist.       Neck: Supple with no signs of meningismus.  No C-spine tenderness Cardiovascular: Bradycardic with regular rhythm and no murmurs. Respiratory: Normal respiratory effort. Lungs are clear to auscultation bilaterally. No wheezes, crackles, or rhonchi.  Gastrointestinal: Soft, non tender, and non distended with positive bowel sounds. No rebound or guarding. Musculoskeletal: Nontender with normal range of motion in all extremities.  No T and L-spine tenderness.  No edema, cyanosis, or erythema of extremities. Neurologic: Normal speech and language. Face is symmetric. Moving all extremities. No gross focal neurologic deficits are appreciated. Skin: Skin is warm, dry and intact. No rash noted. Psychiatric: Mood and affect are normal. Speech and behavior are normal.  ____________________________________________   LABS (all labs ordered are listed, but only abnormal results are displayed)  Labs  Reviewed  CBC WITH DIFFERENTIAL/PLATELET - Abnormal; Notable for the following components:      Result Value   RBC 3.85 (*)    MCV 102.3 (*)    MCH 35.3 (*)    Platelets 126 (*)    Lymphs Abs 0.2 (*)    All other components within normal limits  COMPREHENSIVE METABOLIC PANEL - Abnormal; Notable for the following components:   Glucose, Bld 134 (*)    Total Bilirubin 1.6 (*)    All other components within normal limits  RESP PANEL BY RT-PCR (FLU A&B, COVID) ARPGX2  URINALYSIS, COMPLETE (UACMP) WITH MICROSCOPIC   ____________________________________________  EKG  ED ECG REPORT I, Rudene Re, the attending physician, personally viewed and interpreted this ECG.  Sinus bradycardia,  rate of 50, normal QTC, no ST elevations or depressions.  Unchanged from prior from 2018. ____________________________________________  RADIOLOGY  CT: PND   ____________________________________________   PROCEDURES  Procedure(s) performed:yes .1-3 Lead EKG Interpretation Performed by: Rudene Re, MD Authorized by: Rudene Re, MD     Interpretation: non-specific     ECG rate assessment: bradycardic     Rhythm: sinus bradycardia     Ectopy: none     Critical Care performed:  None ____________________________________________   INITIAL IMPRESSION / ASSESSMENT AND PLAN / ED COURSE  79 y.o. male patient with a history of dementia, hyponatremia, bradycardia, anemia, hypertension, hyperlipidemia who presents for evaluation of diarrhea, generalized weakness and two falls this evening.  Patient is well-appearing in no distress, neurologically intact with no traumatic injuries on physical and history.  He is bradycardic with an EKG showing sinus bradycardia which is unchanged from prior from 2018.  Blood pressure upon arrival was 110/66 which is significantly improved (systolics were in the 56L at home prior to receiving 500 cc per EMS).  Abdomen is soft with no tenderness.   Patient is currently at baseline.  History is gathered from him and his wife was at bedside.   Ddx dehydration, AKI, anemia, electrolyte derangements, infectious diarrhea, C. Diff, gastroenteritis, colitis, diverticulitis.  Plan for CBC, CMP, urinalysis.  Will give IV fluids.  Patient placed on telemetry for close monitoring.  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  No AKI, no anemia.  CT head and viral panel pending.  Patient receiving fluids.  Plan discussed with Dr. Vladimir Crofts who accepted care of patient.      _____________________________________________ Please note:  Patient was evaluated in Emergency Department today for the symptoms described in the history of present illness. Patient was evaluated in the context of the global COVID-19 pandemic, which necessitated consideration that the patient might be at risk for infection with the SARS-CoV-2 virus that causes COVID-19. Institutional protocols and algorithms that pertain to the evaluation of patients at risk for COVID-19 are in a state of rapid change based on information released by regulatory bodies including the CDC and federal and state organizations. These policies and algorithms were followed during the patient's care in the ED.  Some ED evaluations and interventions may be delayed as a result of limited staffing during the pandemic.   [place] Controlled Substance Database was reviewed by me. ____________________________________________   FINAL CLINICAL IMPRESSION(S) / ED DIAGNOSES   Final diagnoses:  Diarrhea of presumed infectious origin  Fall, initial encounter  Hypotension due to hypovolemia      NEW MEDICATIONS STARTED DURING THIS VISIT:  ED Discharge Orders    None       Note:  This document was prepared using Dragon  voice recognition software and may include unintentional dictation errors.    Rudene Re, MD 04/02/20 [phone]

## 2020-04-02 NOTE — ED Notes (Signed)
[place]ender, blue, and light green blood sample tubes to lab for specimen hold.

## 2020-04-02 NOTE — Discharge Instructions (Signed)
As we discussed, push fluids at home to maintain his hydration.  If he develops any similar or worsening symptoms, particularly with passing out, fevers or bloody diarrhea, please return to the ED.  Otherwise follow-up with his PCP this week.  Use Tylenol for pain and fevers.  Up to 1000 mg per dose, up to 4 times per day.  Do not take more than 4000 mg of Tylenol/acetaminophen within 24 hours.Marland Kitchen

## 2020-04-04 ENCOUNTER — Emergency Department: Payer: Medicare Other

## 2020-04-04 ENCOUNTER — Inpatient Hospital Stay: Payer: Medicare Other | Admitting: Anesthesiology

## 2020-04-04 ENCOUNTER — Inpatient Hospital Stay
Admission: EM | Admit: 2020-04-04 | Discharge: 2020-04-16 | DRG: 311 | Disposition: A | Discharge status: Hospice | Payer: Medicare Other | Attending: Internal Medicine | Admitting: Internal Medicine

## 2020-04-04 ENCOUNTER — Encounter
Admission: EM | Disposition: A | Discharge status: Hospice | Payer: Self-pay | Source: Home / Self Care | Attending: Internal Medicine

## 2020-04-04 ENCOUNTER — Inpatient Hospital Stay (HOSPITAL_COMMUNITY)
Admit: 2020-04-04 | Discharge: 2020-04-04 | Disposition: A | Discharge status: Home / Self Care | Payer: Medicare Other | Attending: Physician Assistant | Admitting: Physician Assistant

## 2020-04-04 ENCOUNTER — Other Ambulatory Visit: Payer: Self-pay

## 2020-04-04 DIAGNOSIS — S2241XA Multiple fractures of ribs, right side, initial encounter for closed fracture: Secondary | ICD-10-CM | POA: Diagnosis present

## 2020-04-04 DIAGNOSIS — R945 Abnormal results of liver function studies: Secondary | ICD-10-CM

## 2020-04-04 DIAGNOSIS — S0219XA Other fracture of base of skull, initial encounter for closed fracture: Secondary | ICD-10-CM | POA: Diagnosis present

## 2020-04-04 DIAGNOSIS — E8809 Other disorders of plasma-protein metabolism, not elsewhere classified: Secondary | ICD-10-CM | POA: Diagnosis not present

## 2020-04-04 DIAGNOSIS — R296 Repeated falls: Secondary | ICD-10-CM | POA: Diagnosis present

## 2020-04-04 DIAGNOSIS — I9589 Other hypotension: Secondary | ICD-10-CM | POA: Diagnosis not present

## 2020-04-04 DIAGNOSIS — R001 Bradycardia, unspecified: Secondary | ICD-10-CM | POA: Diagnosis not present

## 2020-04-04 DIAGNOSIS — J9601 Acute respiratory failure with hypoxia: Secondary | ICD-10-CM | POA: Diagnosis present

## 2020-04-04 DIAGNOSIS — Z8249 Family history of ischemic heart disease and other diseases of the circulatory system: Secondary | ICD-10-CM

## 2020-04-04 DIAGNOSIS — I248 Other forms of acute ischemic heart disease: Secondary | ICD-10-CM | POA: Diagnosis present

## 2020-04-04 DIAGNOSIS — F0391 Unspecified dementia with behavioral disturbance: Secondary | ICD-10-CM | POA: Diagnosis present

## 2020-04-04 DIAGNOSIS — B9620 Unspecified Escherichia coli [E. coli] as the cause of diseases classified elsewhere: Secondary | ICD-10-CM | POA: Diagnosis present

## 2020-04-04 DIAGNOSIS — E860 Dehydration: Secondary | ICD-10-CM | POA: Diagnosis present

## 2020-04-04 DIAGNOSIS — S0281XA Fracture of other specified skull and facial bones, right side, initial encounter for closed fracture: Secondary | ICD-10-CM | POA: Diagnosis not present

## 2020-04-04 DIAGNOSIS — I739 Peripheral vascular disease, unspecified: Secondary | ICD-10-CM | POA: Diagnosis not present

## 2020-04-04 DIAGNOSIS — D696 Thrombocytopenia, unspecified: Secondary | ICD-10-CM | POA: Diagnosis present

## 2020-04-04 DIAGNOSIS — Z515 Encounter for palliative care: Secondary | ICD-10-CM

## 2020-04-04 DIAGNOSIS — E782 Mixed hyperlipidemia: Secondary | ICD-10-CM | POA: Diagnosis present

## 2020-04-04 DIAGNOSIS — S0231XA Fracture of orbital floor, right side, initial encounter for closed fracture: Secondary | ICD-10-CM | POA: Diagnosis present

## 2020-04-04 DIAGNOSIS — Z66 Do not resuscitate: Secondary | ICD-10-CM | POA: Diagnosis present

## 2020-04-04 DIAGNOSIS — S0230XA Fracture of orbital floor, unspecified side, initial encounter for closed fracture: Secondary | ICD-10-CM | POA: Diagnosis present

## 2020-04-04 DIAGNOSIS — F0390 Unspecified dementia without behavioral disturbance: Secondary | ICD-10-CM | POA: Diagnosis not present

## 2020-04-04 DIAGNOSIS — I708 Atherosclerosis of other arteries: Secondary | ICD-10-CM | POA: Diagnosis not present

## 2020-04-04 DIAGNOSIS — H9190 Unspecified hearing loss, unspecified ear: Secondary | ICD-10-CM | POA: Diagnosis present

## 2020-04-04 DIAGNOSIS — Z7982 Long term (current) use of aspirin: Secondary | ICD-10-CM

## 2020-04-04 DIAGNOSIS — S0511XA Contusion of eyeball and orbital tissues, right eye, initial encounter: Secondary | ICD-10-CM | POA: Diagnosis not present

## 2020-04-04 DIAGNOSIS — E872 Acidosis, unspecified: Secondary | ICD-10-CM | POA: Diagnosis present

## 2020-04-04 DIAGNOSIS — R609 Edema, unspecified: Secondary | ICD-10-CM | POA: Diagnosis not present

## 2020-04-04 DIAGNOSIS — F0150 Vascular dementia without behavioral disturbance: Secondary | ICD-10-CM | POA: Diagnosis not present

## 2020-04-04 DIAGNOSIS — J309 Allergic rhinitis, unspecified: Secondary | ICD-10-CM | POA: Diagnosis not present

## 2020-04-04 DIAGNOSIS — S0181XA Laceration without foreign body of other part of head, initial encounter: Secondary | ICD-10-CM | POA: Diagnosis present

## 2020-04-04 DIAGNOSIS — K5900 Constipation, unspecified: Secondary | ICD-10-CM

## 2020-04-04 DIAGNOSIS — T796XXA Traumatic ischemia of muscle, initial encounter: Secondary | ICD-10-CM | POA: Diagnosis not present

## 2020-04-04 DIAGNOSIS — S2231XA Fracture of one rib, right side, initial encounter for closed fracture: Secondary | ICD-10-CM | POA: Diagnosis not present

## 2020-04-04 DIAGNOSIS — R778 Other specified abnormalities of plasma proteins: Secondary | ICD-10-CM | POA: Diagnosis present

## 2020-04-04 DIAGNOSIS — E871 Hypo-osmolality and hyponatremia: Secondary | ICD-10-CM | POA: Diagnosis not present

## 2020-04-04 DIAGNOSIS — R102 Pelvic and perineal pain: Secondary | ICD-10-CM | POA: Diagnosis not present

## 2020-04-04 DIAGNOSIS — Z043 Encounter for examination and observation following other accident: Secondary | ICD-10-CM | POA: Diagnosis not present

## 2020-04-04 DIAGNOSIS — I10 Essential (primary) hypertension: Secondary | ICD-10-CM | POA: Diagnosis not present

## 2020-04-04 DIAGNOSIS — R197 Diarrhea, unspecified: Secondary | ICD-10-CM | POA: Diagnosis present

## 2020-04-04 DIAGNOSIS — R7989 Other specified abnormal findings of blood chemistry: Secondary | ICD-10-CM | POA: Diagnosis present

## 2020-04-04 DIAGNOSIS — W010XXA Fall on same level from slipping, tripping and stumbling without subsequent striking against object, initial encounter: Secondary | ICD-10-CM | POA: Diagnosis not present

## 2020-04-04 DIAGNOSIS — E861 Hypovolemia: Secondary | ICD-10-CM | POA: Diagnosis present

## 2020-04-04 DIAGNOSIS — I8390 Asymptomatic varicose veins of unspecified lower extremity: Secondary | ICD-10-CM | POA: Diagnosis present

## 2020-04-04 DIAGNOSIS — E876 Hypokalemia: Secondary | ICD-10-CM | POA: Diagnosis not present

## 2020-04-04 DIAGNOSIS — Z79899 Other long term (current) drug therapy: Secondary | ICD-10-CM

## 2020-04-04 DIAGNOSIS — Z23 Encounter for immunization: Secondary | ICD-10-CM

## 2020-04-04 DIAGNOSIS — Y92008 Other place in unspecified non-institutional (private) residence as the place of occurrence of the external cause: Secondary | ICD-10-CM

## 2020-04-04 DIAGNOSIS — E785 Hyperlipidemia, unspecified: Secondary | ICD-10-CM | POA: Diagnosis not present

## 2020-04-04 DIAGNOSIS — F05 Delirium due to known physiological condition: Secondary | ICD-10-CM | POA: Diagnosis not present

## 2020-04-04 DIAGNOSIS — Z7189 Other specified counseling: Secondary | ICD-10-CM | POA: Diagnosis not present

## 2020-04-04 DIAGNOSIS — R509 Fever, unspecified: Secondary | ICD-10-CM | POA: Diagnosis present

## 2020-04-04 DIAGNOSIS — Y92009 Unspecified place in unspecified non-institutional (private) residence as the place of occurrence of the external cause: Secondary | ICD-10-CM

## 2020-04-04 DIAGNOSIS — Z20822 Contact with and (suspected) exposure to covid-19: Secondary | ICD-10-CM | POA: Diagnosis present

## 2020-04-04 DIAGNOSIS — Z9049 Acquired absence of other specified parts of digestive tract: Secondary | ICD-10-CM

## 2020-04-04 DIAGNOSIS — N390 Urinary tract infection, site not specified: Secondary | ICD-10-CM | POA: Diagnosis present

## 2020-04-04 DIAGNOSIS — K760 Fatty (change of) liver, not elsewhere classified: Secondary | ICD-10-CM | POA: Diagnosis not present

## 2020-04-04 DIAGNOSIS — E43 Unspecified severe protein-calorie malnutrition: Secondary | ICD-10-CM | POA: Diagnosis not present

## 2020-04-04 DIAGNOSIS — G9341 Metabolic encephalopathy: Secondary | ICD-10-CM | POA: Diagnosis not present

## 2020-04-04 DIAGNOSIS — I517 Cardiomegaly: Secondary | ICD-10-CM | POA: Diagnosis not present

## 2020-04-04 DIAGNOSIS — W19XXXA Unspecified fall, initial encounter: Secondary | ICD-10-CM

## 2020-04-04 DIAGNOSIS — Z820 Family history of epilepsy and other diseases of the nervous system: Secondary | ICD-10-CM

## 2020-04-04 DIAGNOSIS — R58 Hemorrhage, not elsewhere classified: Secondary | ICD-10-CM | POA: Diagnosis not present

## 2020-04-04 DIAGNOSIS — I214 Non-ST elevation (NSTEMI) myocardial infarction: Secondary | ICD-10-CM

## 2020-04-04 DIAGNOSIS — H5703 Miosis: Secondary | ICD-10-CM | POA: Diagnosis present

## 2020-04-04 DIAGNOSIS — M6282 Rhabdomyolysis: Secondary | ICD-10-CM | POA: Diagnosis not present

## 2020-04-04 DIAGNOSIS — I8393 Asymptomatic varicose veins of bilateral lower extremities: Secondary | ICD-10-CM | POA: Diagnosis not present

## 2020-04-04 DIAGNOSIS — S0230XD Fracture of orbital floor, unspecified side, subsequent encounter for fracture with routine healing: Secondary | ICD-10-CM | POA: Diagnosis not present

## 2020-04-04 DIAGNOSIS — T796XXD Traumatic ischemia of muscle, subsequent encounter: Secondary | ICD-10-CM | POA: Diagnosis not present

## 2020-04-04 DIAGNOSIS — R52 Pain, unspecified: Secondary | ICD-10-CM | POA: Diagnosis not present

## 2020-04-04 DIAGNOSIS — Z743 Need for continuous supervision: Secondary | ICD-10-CM | POA: Diagnosis not present

## 2020-04-04 DIAGNOSIS — S0101XA Laceration without foreign body of scalp, initial encounter: Secondary | ICD-10-CM

## 2020-04-04 DIAGNOSIS — R634 Abnormal weight loss: Secondary | ICD-10-CM | POA: Diagnosis not present

## 2020-04-04 DIAGNOSIS — R404 Transient alteration of awareness: Secondary | ICD-10-CM | POA: Diagnosis not present

## 2020-04-04 DIAGNOSIS — Z6824 Body mass index (BMI) 24.0-24.9, adult: Secondary | ICD-10-CM | POA: Diagnosis not present

## 2020-04-04 DIAGNOSIS — N3001 Acute cystitis with hematuria: Secondary | ICD-10-CM | POA: Diagnosis not present

## 2020-04-04 DIAGNOSIS — S2231XD Fracture of one rib, right side, subsequent encounter for fracture with routine healing: Secondary | ICD-10-CM | POA: Diagnosis not present

## 2020-04-04 DIAGNOSIS — S0280XD Fracture of other specified skull and facial bones, unspecified side, subsequent encounter for fracture with routine healing: Secondary | ICD-10-CM | POA: Diagnosis not present

## 2020-04-04 DIAGNOSIS — R279 Unspecified lack of coordination: Secondary | ICD-10-CM | POA: Diagnosis not present

## 2020-04-04 LAB — CBC WITH DIFFERENTIAL/PLATELET
Abs Immature Granulocytes: 0.05 10*3/uL (ref 0.00–0.07)
Basophils Absolute: 0 10*3/uL (ref 0.0–0.1)
Basophils Relative: 0 %
Eosinophils Absolute: 0 10*3/uL (ref 0.0–0.5)
Eosinophils Relative: 0 %
HCT: 34.4 % — ABNORMAL LOW (ref 39.0–52.0)
Hemoglobin: 12.5 g/dL — ABNORMAL LOW (ref 13.0–17.0)
Immature Granulocytes: 1 %
Lymphocytes Relative: 4 %
Lymphs Abs: 0.3 10*3/uL — ABNORMAL LOW (ref 0.7–4.0)
MCH: 35.4 pg — ABNORMAL HIGH (ref 26.0–34.0)
MCHC: 36.3 g/dL — ABNORMAL HIGH (ref 30.0–36.0)
MCV: 97.5 fL (ref 80.0–100.0)
Monocytes Absolute: 0.5 10*3/uL (ref 0.1–1.0)
Monocytes Relative: 6 %
Neutro Abs: 6.8 10*3/uL (ref 1.7–7.7)
Neutrophils Relative %: 89 %
Platelets: 106 10*3/uL — ABNORMAL LOW (ref 150–400)
RBC: 3.53 MIL/uL — ABNORMAL LOW (ref 4.22–5.81)
RDW: 11.9 % (ref 11.5–15.5)
WBC: 7.7 10*3/uL (ref 4.0–10.5)
nRBC: 0 % (ref 0.0–0.2)

## 2020-04-04 LAB — BASIC METABOLIC PANEL
Anion gap: 9 (ref 5–15)
Anion gap: 10 (ref 5–15)
BUN: 21 mg/dL (ref 8–23)
BUN: 18 mg/dL (ref 8–23)
CO2: 22 mmol/L (ref 22–32)
CO2: 23 mmol/L (ref 22–32)
Calcium: 8.4 mg/dL — ABNORMAL LOW (ref 8.9–10.3)
Calcium: 8.7 mg/dL — ABNORMAL LOW (ref 8.9–10.3)
Chloride: 99 mmol/L (ref 98–111)
Chloride: 102 mmol/L (ref 98–111)
Creatinine, Ser: 0.99 mg/dL (ref 0.61–1.24)
Creatinine, Ser: 0.84 mg/dL (ref 0.61–1.24)
GFR, Estimated: >60 mL/min (ref >60)
GFR, Estimated: >60 mL/min (ref >60)
Glucose, Bld: 113 mg/dL — ABNORMAL HIGH (ref 70–99)
Glucose, Bld: 113 mg/dL — ABNORMAL HIGH (ref 70–99)
Potassium: 3.3 mmol/L — ABNORMAL LOW (ref 3.5–5.1)
Potassium: 3.4 mmol/L — ABNORMAL LOW (ref 3.5–5.1)
Sodium: 130 mmol/L — ABNORMAL LOW (ref 135–145)
Sodium: 135 mmol/L (ref 135–145)

## 2020-04-04 LAB — COMPREHENSIVE METABOLIC PANEL
ALT: 52 U/L — ABNORMAL HIGH (ref 0–44)
AST: 159 U/L — ABNORMAL HIGH (ref 15–41)
Albumin: 3.4 g/dL — ABNORMAL LOW (ref 3.5–5.0)
Alkaline Phosphatase: 48 U/L (ref 38–126)
Anion gap: 10 (ref 5–15)
BUN: 23 mg/dL (ref 8–23)
CO2: 21 mmol/L — ABNORMAL LOW (ref 22–32)
Calcium: 8.6 mg/dL — ABNORMAL LOW (ref 8.9–10.3)
Chloride: 97 mmol/L — ABNORMAL LOW (ref 98–111)
Creatinine, Ser: 1.07 mg/dL (ref 0.61–1.24)
GFR, Estimated: >60 mL/min (ref >60)
Glucose, Bld: 109 mg/dL — ABNORMAL HIGH (ref 70–99)
Potassium: 3.3 mmol/L — ABNORMAL LOW (ref 3.5–5.1)
Sodium: 128 mmol/L — ABNORMAL LOW (ref 135–145)
Total Bilirubin: 1.5 mg/dL — ABNORMAL HIGH (ref 0.3–1.2)
Total Protein: 6.5 g/dL (ref 6.5–8.1)

## 2020-04-04 LAB — URINALYSIS, COMPLETE (UACMP) WITH MICROSCOPIC
Bilirubin Urine: NEGATIVE
Glucose, UA: NEGATIVE mg/dL
Ketones, ur: NEGATIVE mg/dL
Nitrite: POSITIVE — AB
Protein, ur: 30 mg/dL — AB
Specific Gravity, Urine: 1.008 (ref 1.005–1.030)
Squamous Epithelial / HPF: NONE SEEN (ref 0–5)
pH: 6 (ref 5.0–8.0)

## 2020-04-04 LAB — TROPONIN I (HIGH SENSITIVITY)
Troponin I (High Sensitivity): 85 ng/L — ABNORMAL HIGH (ref <18)
Troponin I (High Sensitivity): 195 ng/L (ref <18)
Troponin I (High Sensitivity): 263 ng/L (ref <18)
Troponin I (High Sensitivity): 347 ng/L (ref <18)

## 2020-04-04 LAB — AMMONIA: Ammonia: 14 umol/L (ref 9–35)

## 2020-04-04 LAB — RESP PANEL BY RT-PCR (FLU A&B, COVID) ARPGX2
Influenza A by PCR: NEGATIVE
Influenza B by PCR: NEGATIVE
SARS Coronavirus 2 by RT PCR: NEGATIVE

## 2020-04-04 LAB — PATHOLOGIST SMEAR REVIEW

## 2020-04-04 LAB — HEPATITIS PANEL, ACUTE
HCV Ab: NONREACTIVE
Hep A IgM: NONREACTIVE
Hep B C IgM: NONREACTIVE
Hepatitis B Surface Ag: NONREACTIVE

## 2020-04-04 LAB — ECHOCARDIOGRAM COMPLETE
Height: 71 in
S' Lateral: 3.3 cm
Weight: 2945.35 oz

## 2020-04-04 LAB — MAGNESIUM: Magnesium: 1.8 mg/dL (ref 1.7–2.4)

## 2020-04-04 LAB — LACTATE DEHYDROGENASE: LDH: 314 U/L — ABNORMAL HIGH (ref 98–192)

## 2020-04-04 LAB — SAVE SMEAR(SSMR), FOR PROVIDER SLIDE REVIEW

## 2020-04-04 LAB — OSMOLALITY, URINE: Osmolality, Ur: 296 mOsm/kg — ABNORMAL LOW (ref 300–900)

## 2020-04-04 LAB — PROTIME-INR
INR: 1.1 (ref 0.8–1.2)
Prothrombin Time: 13.4 seconds (ref 11.4–15.2)

## 2020-04-04 LAB — APTT: aPTT: 27 seconds (ref 24–36)

## 2020-04-04 LAB — BRAIN NATRIURETIC PEPTIDE: B Natriuretic Peptide: 279.2 pg/mL — ABNORMAL HIGH (ref 0.0–100.0)

## 2020-04-04 LAB — SODIUM, URINE, RANDOM: Sodium, Ur: 19 mmol/L

## 2020-04-04 LAB — OSMOLALITY: Osmolality: 276 mOsm/kg (ref 275–295)

## 2020-04-04 LAB — TSH: TSH: 1.472 u[IU]/mL (ref 0.350–4.500)

## 2020-04-04 LAB — LACTIC ACID, PLASMA
Lactic Acid, Venous: 3.3 mmol/L (ref 0.5–1.9)
Lactic Acid, Venous: 1.5 mmol/L (ref 0.5–1.9)

## 2020-04-04 LAB — CK: Total CK: 1056 U/L — ABNORMAL HIGH (ref 49–397)

## 2020-04-04 SURGERY — EXAM UNDER ANESTHESIA
Anesthesia: General

## 2020-04-04 MED ORDER — OXYCODONE-ACETAMINOPHEN 5-325 MG PO TABS: 1.0000 | ORAL_TABLET | ORAL | Status: DC | PRN

## 2020-04-04 MED ORDER — SODIUM CHLORIDE 0.9 % IV SOLN
1.0000 g | INTRAVENOUS | Status: DC
  Administered 2020-04-04 – 2020-04-06 (×3): 1 g via INTRAVENOUS
  Filled 2020-04-04 (×5): qty 10

## 2020-04-04 MED ORDER — ONDANSETRON HCL 4 MG/2ML IJ SOLN: 4.0000 mg | Freq: Three times a day (TID) | INTRAMUSCULAR | Status: DC | PRN

## 2020-04-04 MED ORDER — IBUPROFEN 400 MG PO TABS: 400.0000 mg | ORAL_TABLET | Freq: Four times a day (QID) | ORAL | Status: DC | PRN

## 2020-04-04 MED ORDER — HEPARIN (PORCINE) 25000 UT/250ML-% IV SOLN: 950.0000 [IU]/h | INTRAVENOUS | Status: DC

## 2020-04-04 MED ORDER — HEPARIN BOLUS VIA INFUSION
4000.0000 [IU] | Freq: Once | INTRAVENOUS | Status: DC
  Filled 2020-04-04: qty 4000

## 2020-04-04 MED ORDER — LACTATED RINGERS IV BOLUS
1000.0000 mL | Freq: Once | INTRAVENOUS | Status: AC
  Administered 2020-04-04: 10:00:00 1000 mL via INTRAVENOUS

## 2020-04-04 MED ORDER — LORAZEPAM 2 MG/ML IJ SOLN
2.0000 mg | Freq: Once | INTRAMUSCULAR | Status: AC
  Administered 2020-04-04: 18:00:00 2 mg via INTRAVENOUS
  Filled 2020-04-04: qty 1

## 2020-04-04 MED ORDER — PROPOFOL 10 MG/ML IV BOLUS
INTRAVENOUS | Status: AC
  Filled 2020-04-04: qty 20

## 2020-04-04 MED ORDER — OXYCODONE HCL 5 MG PO TABS: 5.0000 mg | ORAL_TABLET | ORAL | Status: DC | PRN

## 2020-04-04 MED ORDER — LIDOCAINE-EPINEPHRINE 2 %-1:100000 IJ SOLN
20.0000 mL | Freq: Once | INTRAMUSCULAR | Status: AC
  Administered 2020-04-04: 10:00:00 20 mL
  Filled 2020-04-04: qty 1

## 2020-04-04 MED ORDER — TETANUS-DIPHTH-ACELL PERTUSSIS 5-2.5-18.5 LF-MCG/0.5 IM SUSY
0.5000 mL | PREFILLED_SYRINGE | Freq: Once | INTRAMUSCULAR | Status: AC
  Administered 2020-04-04: 10:00:00 0.5 mL via INTRAMUSCULAR
  Filled 2020-04-04: qty 0.5

## 2020-04-04 MED ORDER — HYDRALAZINE HCL 20 MG/ML IJ SOLN
5.0000 mg | INTRAMUSCULAR | Status: DC | PRN
  Filled 2020-04-04: qty 1

## 2020-04-04 MED ORDER — SODIUM CHLORIDE 0.9 % IV SOLN: INTRAVENOUS | Status: DC

## 2020-04-04 MED ORDER — POTASSIUM CHLORIDE CRYS ER 20 MEQ PO TBCR
40.0000 meq | EXTENDED_RELEASE_TABLET | Freq: Once | ORAL | Status: AC
  Administered 2020-04-04: 12:00:00 40 meq via ORAL
  Filled 2020-04-04: qty 2

## 2020-04-04 MED ORDER — HALOPERIDOL LACTATE 5 MG/ML IJ SOLN
3.0000 mg | Freq: Once | INTRAMUSCULAR | Status: AC
  Administered 2020-04-04: 17:00:00 3 mg via INTRAVENOUS
  Filled 2020-04-04: qty 1

## 2020-04-04 MED ORDER — AMLODIPINE BESYLATE 10 MG PO TABS
10.0000 mg | ORAL_TABLET | Freq: Every day | ORAL | Status: DC
  Administered 2020-04-05 – 2020-04-07 (×3): 10 mg via ORAL
  Filled 2020-04-04 (×3): qty 1

## 2020-04-04 MED ORDER — FENTANYL CITRATE (PF) 100 MCG/2ML IJ SOLN
INTRAMUSCULAR | Status: AC
  Filled 2020-04-04: qty 2

## 2020-04-04 NOTE — Consult Note (Signed)
Cardiology Consultation:   Patient ID: Travis Craig MRN: [id number]; DOB: [birth year]  Admit date: 04/04/2020 Date of Consult: 04/04/2020  Primary Care Provider: Olin Hauser, DO [place] Electrophysiologist:  None    Patient Profile:   Travis Craig is a 79 y.o. male with a hx of bradycardia, hypertension, dementia, and who is being seen today for the evaluation of elevated troponin and recent fall at the request of Dr. Blaine Hamper.  History of Present Illness:   Travis Craig is an 79 year old male with PMH as above and including bradycardia and hypertension.    He was previously evaluated by Dr. Irish Lack in 2018 without further follow-up.  At that time, it was noted he was maintained on lisinopril for history of hypertension.  Home pressures with SBP in the 170s.  He has been evaluated by the [facility] and several stress tests in months that she is previous years, reportedly normal.  He had history of asymptomatic bradycardia.  He was walking up to 11 miles a day.  He had lost 35 to 40 pounds over the last few years.  BP control is recommended with increased lisinopril as needed, as well as possible addition of amlodipine.  Low-salt diet recommended.  He was continued on atorvastatin.  Leg elevation was recommended for lower extremity edema. Aerobic exercise and dietary changes discussed. He was lost to follow-up.  He has documented history of dementia. He recently was evaluated in the ED for fall 2/2 dehydration.   Most of the below history was obtained via conversation with his wife, present in the room at the time of cardiology consultation.  She reports that he has had diarrhea since eating shrimp-dish, which has worsened his underlying dementia.  He has been dehydrated over the last few days, resulting in his presentation to the emergency department earlier this weekend.  She reports that he was doing relatively well  after his most recent presentation to the emergency department but still having ongoing diarrhea. She was working on keeping him well hydrated and fed all weekend.  On 12/18 at 5AM, he reportedly got out of bed and started getting ready for what he thought was work (he is not currently employed). He walked outside carrying a box of various items, including some cat toys from around the house.  While getting ready, he tied his two shoe laces together. He then walked outside and experienced a mechanical fall, suspected due to tying his shoelaces together, and hitting his head. He was found outside by the paper delivery worker.  He was reportedly outside for 1 hour per notes; however, this is only suspected due to feeling the objects inside of his box, which were reportedly still warm.    She (the delivery worker)  then knocked on the pt's front door and informed his wife of the fall.   EMS called and reported he was hypotensive with IV fluids started by EMS.    In the ED, BP 144/80 with fever of 100.7 and HR 68.  Initial labs significant for stable renal function, K 3.8, Mg 1.8. Hg 13.6 with HCT 39.4. HS Tn 85  195. CK 1056.0. Lactic acid 3.3.  Head CT negative. Repeat imaging with minimally depressed right orbital floor fracture and nondisplaced right pterygoid plate fractures without complete LeFort injury.  Chest x-ray showed small right midlung density, consistent with scarring and evidence of prior granulomatous disease.  Rib fractures noted.   Today, he denies any CP or SOB. No  racing HR or palpitations.  He continues to be relatively asymptomatic when bradycardic.   Past Medical History:  Diagnosis Date  . Chicken pox   . Hearing loss   . Hyperlipidemia   . Hypertension   . Ulcer     Past Surgical History:  Procedure Laterality Date  . BLEPHAROPLASTY    . CHOLECYSTECTOMY    . Ulcer surgery       Home Medications:  Prior to Admission medications   Medication Sig Start Date End Date  Taking? Authorizing Provider  atorvastatin (LIPITOR) 20 MG tablet TAKE 1 TABLET DAILY Patient taking differently: Take 20 mg by mouth daily. 10/20/19  Yes Karamalegos, Devonne Doughty, DO  sertraline (ZOLOFT) 50 MG tablet Take 25 mg by mouth daily. 03/30/20  Yes [provider]  albuterol (VENTOLIN HFA) 108 (90 Base) MCG/ACT inhaler Inhale 2 puffs into the lungs every 4 (four) hours as needed.    [provider]  aspirin EC 81 MG tablet Take 81 mg by mouth daily.    [provider]  calcium-vitamin D (OSCAL WITH D) 500-200 MG-UNIT tablet Take 1 tablet by mouth.    [provider]  Coenzyme Q10 (COQ10 PO) Take by mouth.     [provider]  fluticasone (FLONASE) 50 MCG/ACT nasal spray Place 1 spray into both nostrils 2 (two) times daily. 06/19/19   [provider]  ipratropium (ATROVENT) 0.06 % nasal spray Place 2 sprays into both nostrils 4 (four) times daily as needed for rhinitis. 05/19/19   Karamalegos, Devonne Doughty, DO  Multiple Vitamin (MULTI-VITAMIN DAILY PO) Take by mouth.    [provider]  Omega-3 1400 MG CAPS Take 1,600 mg by mouth.     [provider]    Inpatient Medications: Scheduled Meds:  Continuous Infusions:  PRN Meds:   Allergies:   No Known Allergies  Social History:   Social History   Socioeconomic History  . Marital status: Married    Spouse name: Not on file  . Number of children: 2  . Years of education: 72  . Highest education level: Not on file  Occupational History  . Occupation: Retired  Tobacco Use  . Smoking status: Never Smoker  . Smokeless tobacco: Never Used  Vaping Use  . Vaping Use: Never used  Substance and Sexual Activity  . Alcohol use: Yes    Alcohol/week: 2.0 standard drinks    Types: 2 Glasses of wine per week  . Drug use: No  . Sexual activity: Not Currently    Partners: Female  Other Topics Concern  . 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 Hypertension Father   . Alzheimer's disease Father      ROS:  Please see the history of present illness.  Review of Systems  Unable to perform ROS: Dementia  Respiratory: Negative for shortness of breath.  Cardiovascular: Negative for chest pain, palpitations, leg swelling and PND.  All other systems reviewed and are negative.   All other ROS reviewed and negative.     Physical Exam/Data:   Vitals:   04/04/20 0619 04/04/20 0621 04/04/20 0946  BP: (!) 144/80  (!) 170/92  Pulse: 68  65  Resp: 17  13  Temp: (!) 100.7 F (38.2 C)  98.7 F (37.1 C)  TempSrc: Oral  Oral  SpO2: 97%  99%  Weight:  83.5 kg   Height:  5\' 11"  (1.803 m)    No intake or output data in the 24 hours ending 04/04/20 1135 Last 3 Weights 04/04/2020 04/02/2020 03/25/2020  Weight (lbs) 184 lb 1.4 oz 184 lb 1.4 oz 184 lb  Weight (kg) 83.5 kg 83.5 kg 83.462 kg     Body mass index is 25.67 kg/m.  General: Elderly male, most of his face was wrapped in bandages, right eye hematoma noted, full face swelling and ecchymosis. HEENT: Bandaged face Lymph: no adenopathy Endocrine:  No thryomegaly Vascular: No carotid bruits; FA pulses 2+ bilaterally without bruits  Cardiac:  normal S1, S2; bradycardic and regular; no murmur  Lungs:  Coarse breath sounds bilaterally via anterior auscultation only, rhonchi appreciated  Abd: soft, nontender, no hepatomegaly  Ext: no edema Musculoskeletal:  No deformities, BUE and BLE  strength normal and equal Skin: warm and dry  Neuro:  CNs 2-12 intact, no focal abnormalities noted Psych:  Normal affect   EKG:  The EKG was personally reviewed and demonstrates:  NSR, 61bpm, LVH, LAFB, no acute ST/T changes, baseline wander and artifact.  Telemetry:  Telemetry was personally reviewed and demonstrates: SB-NSR  Relevant CV Studies: Echo pending  Laboratory Data:  High Sensitivity Troponin:   Recent Labs  Lab 04/04/20 0649 04/04/20 0955  TROPONINIHS 85* 195*     Chemistry Recent Labs  Lab 04/02/20 0548 04/04/20 0649  NA 135 128*  K 3.8 3.3*  CL 101 97*  CO2 23 21*  GLUCOSE 134* 109*  BUN 19 23  CREATININE 0.99 1.07  CALCIUM 8.9 8.6*  GFRNONAA >60 >60  ANIONGAP 11 10    Recent Labs  Lab 04/02/20 0548 04/04/20 0649  PROT 7.1 6.5  ALBUMIN 3.8 3.4*  AST 31 159*  ALT 23 52*  ALKPHOS 59 48  BILITOT 1.6* 1.5*   Hematology Recent Labs  Lab 04/02/20 0548 04/04/20 0649  WBC 7.0 7.7  RBC 3.85* 3.53*  HGB 13.6 12.5*  HCT 39.4 34.4*  MCV 102.3* 97.5  MCH 35.3* 35.4*  MCHC 34.5 36.3*  RDW 11.9 11.9  PLT 126* 106*   BNPNo results for input(s): BNP, PROBNP in the last 168 hours.  DDimer No results for input(s): DDIMER in the last 168 hours.   Radiology/Studies:  DG Chest 1 View  Result Date: 04/04/2020 CLINICAL DATA:  Fall.  Pain. EXAM: CHEST  1 VIEW COMPARISON:  Chest x-ray 09/05/2017. FINDINGS: Calcified mediastinum hilar lymph nodes consistent prior granulomas disease again noted. Cardiomegaly. No pulmonary venous congestion. Persistent stable small density noted the right mid lung consistent with scarring. Persistent right mid pleuroparenchymal thickening consistent scarring. No acute infiltrate. No pleural effusion or pneumothorax. Prominent loops of bowel, most likely colon, again noted in the upper abdomen. Surgical clips upper abdomen. Slightly displaced right anterior 6 rib fracture noted. IMPRESSION: 1. Stable small density right mid  lung consistent with scarring. Right mid lung pleural-parenchymal thickening consistent with scarring. Evidence of prior granulomas disease. No acute pulmonary disease  noted. 2.  Cardiomegaly.  No pulmonary venous congestion. 3.  Displaced right anterior 6 rib fracture noted.  No pneumothorax. Electronically Signed   By: Marcello Moores  Register   On: 04/04/2020 07:37   DG Pelvis 1-2 Views  Result Date: 04/04/2020 CLINICAL DATA:  Fall.  Pain. EXAM: PELVIS - 1-2 VIEW COMPARISON:  No prior. FINDINGS: Degenerative changes lumbar spine and both hips. No acute bony abnormality. No evidence of fracture dislocation. Aortoiliac atherosclerotic vascular calcification. Pelvic calcifications consistent phleboliths. IMPRESSION: Degenerative changes lumbar spine and both hips. No acute abnormality. Electronically Signed   By: Marcello Moores  Register   On: 04/04/2020 07:25   CT Head Wo Contrast  Result Date: 04/04/2020 CLINICAL DATA:  Fall with facial trauma EXAM: CT HEAD WITHOUT CONTRAST CT MAXILLOFACIAL WITHOUT CONTRAST CT CERVICAL SPINE WITHOUT CONTRAST TECHNIQUE: Multidetector CT imaging of the head, cervical spine, and maxillofacial structures were performed using the standard protocol without intravenous contrast. Multiplanar CT image reconstructions of the cervical spine and maxillofacial structures were also generated. COMPARISON:  Head CT 04/02/2020 FINDINGS: CT HEAD FINDINGS Brain: No evidence of acute infarction, hemorrhage, hydrocephalus, extra-axial collection or mass lesion/mass effect. Age normal appearance of the brain. Vascular: No hyperdense vessel or unexpected calcification. Skull: Large right forehead hematoma.  No calvarial fracture. CT MAXILLOFACIAL FINDINGS Osseous: Minimally depressed right orbital floor fracture. Nondisplaced fractures of the right medial and lateral pterygoid plates with probable continuation along the wall of the right nasal cavity, but no complete LeFort injury. The mandible is intact and  located. Large periapical erosion around tooth 3, dehiscent into the sinus. Orbits: Mild extraconal hemorrhage along the orbital floor fracture on the right. Bilateral cataract resection. Sinuses: Low-density/inflammatory appearing mucosal thickening at the right more than left maxillary sinus. Soft tissues: Large hematoma above the right orbit. CT CERVICAL SPINE FINDINGS Alignment: No traumatic malalignment Skull base and vertebrae: No acute fracture Soft tissues and spinal canal: No prevertebral fluid or swelling. No visible canal hematoma. Disc levels:  Ordinary degenerative changes Upper chest: No evidence of injury IMPRESSION: 1. No evidence of intracranial or cervical spine injury. 2. Minimally depressed right orbital floor fracture. 3. Nondisplaced right pterygoid plate fractures. No complete LeFort injury. Electronically Signed   By: Monte Fantasia M.D.   On: 04/04/2020 07:20   CT Head Wo Contrast  Result Date: 04/02/2020 CLINICAL DATA:  Head trauma. EXAM: CT HEAD WITHOUT CONTRAST TECHNIQUE: Contiguous axial images were obtained from the base of the skull through the vertex without intravenous contrast. COMPARISON:  MRI head 12/07/2019. FINDINGS: Brain: No evidence of acute infarction, hemorrhage, hydrocephalus, extra-axial collection or mass lesion/mass effect. Similar cerebral volume loss with ex vacuo ventricular dilation. Mild for age patchy white matter hypoattenuation, likely related to chronic microvascular ischemic disease. Vascular: Calcific atherosclerosis. Skull: No acute fracture.  Right frontal scalp lipoma. Sinuses/Orbits: Mild scattered ethmoid air cell mucosal thickening. No visible air-fluid levels. Unremarkable visualized orbits. Other: No mastoid effusions. IMPRESSION: No evidence of acute intracranial abnormality. Electronically Signed   By: Margaretha Sheffield MD   On: 04/02/2020 07:25   CT Cervical Spine Wo Contrast  Result Date: 04/04/2020 CLINICAL DATA:  Fall with facial  trauma EXAM: CT HEAD WITHOUT CONTRAST CT MAXILLOFACIAL WITHOUT CONTRAST CT CERVICAL SPINE WITHOUT CONTRAST TECHNIQUE: Multidetector CT imaging of the head, cervical spine, and maxillofacial structures were performed using the standard protocol without intravenous contrast. Multiplanar CT image reconstructions of the cervical spine and maxillofacial structures were also generated. COMPARISON:  Head CT 04/02/2020 FINDINGS: CT HEAD FINDINGS  Brain: No evidence of acute infarction, hemorrhage, hydrocephalus, extra-axial collection or mass lesion/mass effect. Age normal appearance of the brain. Vascular: No hyperdense vessel or unexpected calcification. Skull: Large right forehead hematoma.  No calvarial fracture. CT MAXILLOFACIAL FINDINGS Osseous: Minimally depressed right orbital floor fracture. Nondisplaced fractures of the right medial and lateral pterygoid plates with probable continuation along the wall of the right nasal cavity, but no complete LeFort injury. The mandible is intact and located. Large periapical erosion around tooth 3, dehiscent into the sinus. Orbits: Mild extraconal hemorrhage along the orbital floor fracture on the right. Bilateral cataract resection. Sinuses: Low-density/inflammatory appearing mucosal thickening at the right more than left maxillary sinus. Soft tissues: Large hematoma above the right orbit. CT CERVICAL SPINE FINDINGS Alignment: No traumatic malalignment Skull base and vertebrae: No acute fracture Soft tissues and spinal canal: No prevertebral fluid or swelling. No visible canal hematoma. Disc levels:  Ordinary degenerative changes Upper chest: No evidence of injury IMPRESSION: 1. No evidence of intracranial or cervical spine injury. 2. Minimally depressed right orbital floor fracture. 3. Nondisplaced right pterygoid plate fractures. No complete LeFort injury. Electronically Signed   By: Monte Fantasia M.D.   On: 04/04/2020 07:20   CT Maxillofacial WO CM  Result Date:  04/04/2020 CLINICAL DATA:  Fall with facial trauma EXAM: CT HEAD WITHOUT CONTRAST CT MAXILLOFACIAL WITHOUT CONTRAST CT CERVICAL SPINE WITHOUT CONTRAST TECHNIQUE: Multidetector CT imaging of the head, cervical spine, and maxillofacial structures were performed using the standard protocol without intravenous contrast. Multiplanar CT image reconstructions of the cervical spine and maxillofacial structures were also generated. COMPARISON:  Head CT 04/02/2020 FINDINGS: CT HEAD FINDINGS Brain: No evidence of acute infarction, hemorrhage, hydrocephalus, extra-axial collection or mass lesion/mass effect. Age normal appearance of the brain. Vascular: No hyperdense vessel or unexpected calcification. Skull: Large right forehead hematoma.  No calvarial fracture. CT MAXILLOFACIAL FINDINGS Osseous: Minimally depressed right orbital floor fracture. Nondisplaced fractures of the right medial and lateral pterygoid plates with probable continuation along the wall of the right nasal cavity, but no complete LeFort injury. The mandible is intact and located. Large periapical erosion around tooth 3, dehiscent into the sinus. Orbits: Mild extraconal hemorrhage along the orbital floor fracture on the right. Bilateral cataract resection. Sinuses: Low-density/inflammatory appearing mucosal thickening at the right more than left maxillary sinus. Soft tissues: Large hematoma above the right orbit. CT CERVICAL SPINE FINDINGS Alignment: No traumatic malalignment Skull base and vertebrae: No acute fracture Soft tissues and spinal canal: No prevertebral fluid or swelling. No visible canal hematoma. Disc levels:  Ordinary degenerative changes Upper chest: No evidence of injury IMPRESSION: 1. No evidence of intracranial or cervical spine injury. 2. Minimally depressed right orbital floor fracture. 3. Nondisplaced right pterygoid plate fractures. No complete LeFort injury. Electronically Signed   By: Monte Fantasia M.D.   On: 04/04/2020 07:20      Assessment and Plan:   Elevated HS Tn --No reported CP or SOB. Elevated HS Tn in the setting of recently experiencing a mechanical fall and staying outside for 1 hour in the cold, sustaining facial fractures, and elevated BP.  High-sensitivity troponin 85  195 and continues to cycle.  EKG without acute ST/T changes.  Lactic acid elevated at 3.3 with CK total 1056.  BP remains elevated.  Recommend treatment of underlying elevated blood pressure and injuries, as well as recent diarrhea.  Consider supply demand ischemia in the setting of his recent fall and as above above.  No plan for invasive ischemic  work-up / further workup at this time.  Recommend treatment for fall and underlying pathyology. Will obtain echo with further recommendations if indicated at that time.  Caution with IV fluids until EF determined. Continue to monitor on telemetry.   Hypertension --Recommend BP control with goal BP 130/80. Titrate antihypertensives as needed.   Hypokalemia --Replete with goal 4.0.   Hypoalbuminemia --Nutrition consult. Per IM.  Remainder per IM.       For questions or updates, please contact [place] Please consult [email or web address] for contact info under    Signed, Arvil Chaco, PA-C  04/04/2020 11:35 AM

## 2020-04-04 NOTE — Anesthesia Preprocedure Evaluation (Deleted)
Anesthesia Evaluation  Patient identified by MRN, date of birth, ID band Patient confused    Reviewed: Allergy & Precautions, H&P , NPO status , Patient's Chart, lab work & pertinent test results  History of Anesthesia Complications Negative for: history of anesthetic complications  Airway Mallampati: III  TM Distance: >3 FB Neck ROM: limited    Dental  (+) Poor Dentition, Missing   Pulmonary neg pulmonary ROS, neg shortness of breath,    Pulmonary exam normal        Cardiovascular hypertension, (-) anginaNormal cardiovascular exam     Neuro/Psych PSYCHIATRIC DISORDERS Dementia  Neuromuscular disease    GI/Hepatic negative GI ROS, Neg liver ROS,   Endo/Other  negative endocrine ROS  Renal/GU      Musculoskeletal   Abdominal   Peds  Hematology negative hematology ROS (+)   Anesthesia Other Findings Past Medical History: No date: Chicken pox No date: Hearing loss No date: Hyperlipidemia No date: Hypertension No date: Ulcer  Past Surgical History: No date: BLEPHAROPLASTY No date: CHOLECYSTECTOMY No date: Ulcer surgery  BMI    Body Mass Index: 25.67 kg/m      Reproductive/Obstetrics negative OB ROS                             Anesthesia Physical Anesthesia Plan  ASA: III  Anesthesia Plan: General ETT   Post-op Pain Management:    Induction: Intravenous  PONV Risk Score and Plan: Ondansetron, Dexamethasone, Midazolam and Treatment may vary due to age or medical condition  Airway Management Planned: Oral ETT  Additional Equipment:   Intra-op Plan:   Post-operative Plan: Extubation in OR  Informed Consent: I have reviewed the patients History and Physical, chart, labs and discussed the procedure including the risks, benefits and alternatives for the proposed anesthesia with the patient or authorized representative who has indicated his/her understanding and acceptance.    Patient has DNR.  Discussed DNR with power of attorney and Suspend DNR.   Dental Advisory Given  Plan Discussed with: Anesthesiologist, CRNA and Surgeon  Anesthesia Plan Comments: (History and consent from the patients son   Son consented for risks of anesthesia including but not limited to:  - adverse reactions to medications - damage to eyes, teeth, lips or other oral mucosa - nerve damage due to positioning  - sore throat or hoarseness - Damage to heart, brain, nerves, lungs, other parts of body or loss of life  He voiced understanding.)        Anesthesia Quick Evaluation

## 2020-04-04 NOTE — ED Notes (Signed)
Ophthalmology MD at bedside to assess pt.

## 2020-04-04 NOTE — ED Notes (Signed)
Pt has wet the bed. Changed linens at this time. New depends applied at this time.

## 2020-04-04 NOTE — Consult Note (Signed)
Subjective: Pt. Is non-responsive. His son provided limited known history. Pt was found lying in the street pre-dawn today. Altered mental status and facial injury.  Objective: Vital signs in last 24 hours: Temp:  [98.7 F (37.1 C)-100.7 F (38.2 C)] 98.7 F (37.1 C) (12/20 0946) Pulse Rate:  [52-88] 58 (12/20 2000) Resp:  [13-26] 19 (12/20 2000) BP: (127-201)/(57-173) 132/57 (12/20 2000) SpO2:  [94 %-100 %] 96 % (12/20 2000) Weight:  [83.5 kg] 83.5 kg (12/20 0621)    Initial exam: Pt is found sedated and with mittens on both hands.                    The forehead is wrapped in gauze. Wound ws reportedly sutured.   There is large periorbital ecchymoses OD with moderate lid swelling.   There is mild periorbital ecchymoses and moderate lid swellig OS.  Motility exam not possible.  Visual  acuity not possible.  Brief observation of pupils reveals miosis OU. Unable to cross examine for APD due to inability to cooperate.                      IOP not possible as lids are swollen shut pt. becomes combative with attempted manual opening.   Addendum: 1.5 hours later. Exam was re-attempted. Pt is somewhat less sedated and combative. Tetracaine and combo Dilating drops were applied. A lid retractor was placed with little struggle and an exam of the OD was adequate. Pt had spontaneous versions. The pupil was pharmicologically dilated. The conjunctiva quiet except for mild chemosis. The cornea clear. The anterior  chamber quiet. IOL centered. The posterior pole was without hemorrhage or RD.  Recent Labs    04/02/20 0548 04/04/20 0649 04/04/20 1200  WBC 7.0 7.7  --   HGB 13.6 12.5*  --   HCT 39.4 34.4*  --   NA 135 128* 130*  K 3.8 3.3* 3.3*  CL 101 97* 99  CO2 23 21* 22  BUN 19 23 21   CREATININE 0.99 1.07 0.99    Studies/Results: CT scan reviewed. Small orbital floor fracture OD with minimal orbital hemorrhage OD. Globes, muscles ON appear intact OU.   Assessment/Plan: 1) Orbital  contusion OU. More severe OD with small floor fracture. Risk of ruptured globe or other severe injury is very low given the exam obtained as the pt became able to cooperate sufficiently and the generally benign orbital/ ocular findings on imaging.    LOS: 0 days   Birder Robson 12/20/20218:33 PM

## 2020-04-04 NOTE — OR Nursing (Signed)
Per RN nurse case is cancelled. MD and son able to examine in ER.

## 2020-04-04 NOTE — ED Notes (Signed)
Report given to OR at this time.

## 2020-04-04 NOTE — ED Notes (Signed)
Consent signed and placed on chart

## 2020-04-04 NOTE — ED Notes (Signed)
Assumed care of pt at this time. Pt restless in bed. Linens wet. New linens applied at this time. Pt cleansed and situated in bed.

## 2020-04-04 NOTE — ED Notes (Signed)
OR notified at this time of cancellation of procedure.

## 2020-04-04 NOTE — ED Notes (Addendum)
Had extensive converstation with attending reguarding this staff's concerns with detortion of pt's mental status and increasing agitation. Highly requested a rescan of pt due to fall. States he will consult neuro.

## 2020-04-04 NOTE — H&P (Addendum)
History and Physical    Travis Craig YBO:[id number] DOB: [birth year] DOA: 04/04/2020  Referring MD/NP/PA:   PCP: Olin Hauser, DO   Patient coming from:  The patient is coming from home.  At baseline, pt is partially dependent for most of ADL.        Chief Complaint: fall, diarrhea   HPI: Travis Craig is a 79 y.o. male with medical history significant of hypertension, hyperlipidemia, hard of hearing, dementia, hyponatremia, varicose vein, who presents with fall, diarrhea.  Per patient's wife, patient has been having diarrhea for more than 4 days, initially he has diarrhea every few hours. His diarrhea has been improving, but still has several times of watery diarrhea each day.  Patient does not seem to have nausea, vomiting or abdominal pain per his wife.  He has fever, no chills.  His body temperature is 100.7 in ED.  Patient has mild dry cough chronically, which has not changed.  Denies shortness breath and chest pain. Pt has generalized weakness, no unilateral numbness or tingling in extremities.  No facial droop or slurred speech.  Per his wife, patient has dementia, but at his normal baseline patient is orientated x3 most of the time .  In the past several days, patient is mildly confused.  When I saw patient in the ED, patient is alert, orientated to the place and person, not to the time. Per his wife,  He got out of bed and started getting ready for what he thought was work in the early morning (he is not currently employed). Pt fell outside and was found lying in road by bystander. Pt obviously hit his head, and developed a large hematoma above right eye. He also has bruises in the right arm.  ED Course: pt was found to have WBC 7.7, lactic acid 3.3, troponin level 85, 195, BNP 279, CK 1056, negative Covid PCR, potassium 3.3, hyponatremia, renal function okay, abnormal liver function (ALP 48, AST 159, ALT 52, total bilirubin 1.5), thrombocytopenia with a platelet  106, temperature 100.7, blood pressure 170/92, heart rate of 60s 5, 13, oxygen saturation 99% on room air.  Chest x-ray showed some scarring in right anterior sixth rib fracture.  X-ray of pelvis is negative for bony fracture.  CT of her C-spine is negative for bony fracture.  CT of her head showed minimal right orbital floor fracture and nondisplaced right pterygoid plate fracture.  Patient is admitted to progressive bed as inpatient.  Dr. Fletcher Anon of cardiology is consulted.  Review of Systems: Could not be reviewed accurately due to altered mental status.   Allergy: No Known Allergies  Past Medical History:  Diagnosis Date  . Chicken pox   . Hearing loss   . Hyperlipidemia   . Hypertension   . Ulcer     Past Surgical History:  Procedure Laterality Date  . BLEPHAROPLASTY    . CHOLECYSTECTOMY    . Ulcer surgery      Social History:  reports that he has never smoked. He has never used smokeless tobacco. He reports current alcohol use of about 2.0 standard drinks of alcohol per week. He reports that he does not use drugs.  Family History:  Family History  Problem Relation Age of Onset  . Hypertension Father   . Alzheimer's disease Father      Prior to Admission medications   Medication Sig Start Date End Date Taking? Authorizing Provider  albuterol (VENTOLIN HFA) 108 (90 Base) MCG/ACT inhaler Inhale 2 puffs into the  lungs every 4 (four) hours as needed.    [provider]  aspirin EC 81 MG tablet Take 81 mg by mouth daily.    [provider]  atorvastatin (LIPITOR) 20 MG tablet TAKE 1 TABLET DAILY 10/20/19   Karamalegos, Devonne Doughty, DO  calcium-vitamin D (OSCAL WITH D) 500-200 MG-UNIT tablet Take 1 tablet by mouth.    [provider]  Coenzyme Q10 (COQ10 PO) Take by mouth.     [provider]  fluticasone (FLONASE) 50 MCG/ACT nasal spray Place 1 spray into both nostrils 2 (two) times daily. 06/19/19   [provider]  ipratropium  (ATROVENT) 0.06 % nasal spray Place 2 sprays into both nostrils 4 (four) times daily as needed for rhinitis. 05/19/19   Karamalegos, Devonne Doughty, DO  Multiple Vitamin (MULTI-VITAMIN DAILY PO) Take by mouth.    [provider]  Omega-3 1400 MG CAPS Take 1,600 mg by mouth.     [provider]    Physical Exam: Vitals:   04/04/20 1730 04/04/20 1800 04/04/20 1830 04/04/20 1900  BP: (!) 201/173 (!) 142/106 132/72 127/63  Pulse: 88 63 (!) 53 (!) 52  Resp: 20 (!) 24 19 (!) 26  Temp:      TempSrc:      SpO2:  95% 94% 97%  Weight:      Height:       General: Not in acute distress HEENT: has a large hematoma over the right forehead and periorbital area.  has ~3.0 cm curvilinear laceration overlying the right eyebrow with some little oozing bleeding.       Eyes: PERRL, EOMI, no scleral icterus.       ENT: No discharge from the ears and nose, no pharynx injection, no tonsillar enlargement.        Neck: No JVD, no bruit, no mass felt. Heme: No neck lymph node enlargement. Cardiac: S1/S2, RRR, No murmurs, No gallops or rubs. Respiratory: No rales, wheezing, rhonchi or rubs. GI: Soft, nondistended, nontender, no organomegaly, BS present. GU: No hematuria Ext: No pitting leg edema bilaterally. 1+DP/PT pulse bilaterally. Musculoskeletal: No joint deformities, No joint redness or warmth, no limitation of ROM in spin. Skin: No rashes. Has bruises in right arm Neuro: confused, oriented to place and person, not to time, cranial nerves II-XII grossly intact, moves all extremities.  Psych: Patient is not psychotic, no suicidal or hemocidal ideation.  Labs on Admission: I have personally reviewed following labs and imaging studies  CBC: Recent Labs  Lab 04/02/20 0548 04/04/20 0649  WBC 7.0 7.7  NEUTROABS 6.2 6.8  HGB 13.6 12.5*  HCT 39.4 34.4*  MCV 102.3* 97.5  PLT 126* 419*   Basic Metabolic Panel: Recent Labs  Lab 04/02/20 0548 04/04/20 0649 04/04/20 0955 04/04/20 1200   NA 135 128*  --  130*  K 3.8 3.3*  --  3.3*  CL 101 97*  --  99  CO2 23 21*  --  22  GLUCOSE 134* 109*  --  113*  BUN 19 23  --  21  CREATININE 0.99 1.07  --  0.99  CALCIUM 8.9 8.6*  --  8.4*  MG  --   --  1.8  --    GFR: Estimated Creatinine Clearance: 63.4 mL/min (by C-G formula based on SCr of 0.99 mg/dL). Liver Function Tests: Recent Labs  Lab 04/02/20 0548 04/04/20 0649  AST 31 159*  ALT 23 52*  ALKPHOS 59 48  BILITOT 1.6* 1.5*  PROT 7.1  6.5  ALBUMIN 3.8 3.4*   No results for input(s): LIPASE, AMYLASE in the last 168 hours. No results for input(s): AMMONIA in the last 168 hours. Coagulation Profile: Recent Labs  Lab 04/04/20 1200  INR 1.1   Cardiac Enzymes: Recent Labs  Lab 04/04/20 0649  CKTOTAL 1,056*   BNP (last 3 results) No results for input(s): PROBNP in the last 8760 hours. HbA1C: No results for input(s): HGBA1C in the last 72 hours. CBG: No results for input(s): GLUCAP in the last 168 hours. Lipid Profile: No results for input(s): CHOL, HDL, LDLCALC, TRIG, CHOLHDL, LDLDIRECT in the last 72 hours. Thyroid Function Tests: Recent Labs    04/04/20 1200  TSH 1.472   Anemia Panel: No results for input(s): VITAMINB12, FOLATE, FERRITIN, TIBC, IRON, RETICCTPCT in the last 72 hours. Urine analysis:    Component Value Date/Time   COLORURINE YELLOW (A) 04/04/2020 1200   APPEARANCEUR HAZY (A) 04/04/2020 1200   LABSPEC 1.008 04/04/2020 1200   PHURINE 6.0 04/04/2020 1200   GLUCOSEU NEGATIVE 04/04/2020 1200   HGBUR LARGE (A) 04/04/2020 1200   BILIRUBINUR NEGATIVE 04/04/2020 1200   KETONESUR NEGATIVE 04/04/2020 1200   PROTEINUR 30 (A) 04/04/2020 1200   NITRITE POSITIVE (A) 04/04/2020 1200   LEUKOCYTESUR MODERATE (A) 04/04/2020 1200   Sepsis Labs: @LABRCNTIP (procalcitonin:4,lacticidven:4) ) Recent Results (from the past 240 hour(s))  Resp Panel by RT-PCR (Flu A&B, Covid) Nasopharyngeal Swab     Status: None   Collection Time: 04/02/20  6:06 AM    Specimen: Nasopharyngeal Swab; Nasopharyngeal(NP) swabs in vial transport medium  Result Value Ref Range Status   SARS Coronavirus 2 by RT PCR NEGATIVE NEGATIVE Final    Comment: (NOTE) SARS-CoV-2 target nucleic acids are NOT DETECTED.  The SARS-CoV-2 RNA is generally detectable in upper respiratory specimens during the acute phase of infection. The lowest concentration of SARS-CoV-2 viral copies this assay can detect is 138 copies/mL. A negative result does not preclude SARS-Cov-2 infection and should not be used as the sole basis for treatment or other patient management decisions. A negative result may occur with  improper specimen collection/handling, submission of specimen other than nasopharyngeal swab, presence of viral mutation(s) within the areas targeted by this assay, and inadequate number of viral copies(<138 copies/mL). A negative result must be combined with clinical observations, patient history, and epidemiological information. The expected result is Negative.  Fact Sheet for Patients:  EntrepreneurPulse.com.au  Fact Sheet for Healthcare Providers:  IncredibleEmployment.be  This test is no t yet approved or cleared by the [place] FDA and  has been authorized for detection and/or diagnosis of SARS-CoV-2 by FDA under an Emergency Use Authorization (EUA). This EUA will remain  in effect (meaning this test can be used) for the duration of the COVID-19 declaration under Section 564(b)(1) of the Act, 21 U.S.C.section 360bbb-3(b)(1), unless the authorization is terminated  or revoked sooner.       Influenza A by PCR NEGATIVE NEGATIVE Final   Influenza B by PCR NEGATIVE NEGATIVE Final    Comment: (NOTE) The Xpert Xpress SARS-CoV-2/FLU/RSV plus assay is intended as an aid in the diagnosis of influenza from Nasopharyngeal swab specimens and should not be used as a sole basis for treatment. Nasal washings and aspirates are  unacceptable for Xpert Xpress SARS-CoV-2/FLU/RSV testing.  Fact Sheet for Patients: EntrepreneurPulse.com.au  Fact Sheet for Healthcare Providers: IncredibleEmployment.be  This test is not yet approved or cleared by the [place] FDA and has been authorized for detection and/or diagnosis of SARS-CoV-2  by FDA under an Emergency Use Authorization (EUA). This EUA will remain in effect (meaning this test can be used) for the duration of the COVID-19 declaration under Section 564(b)(1) of the Act, 21 U.S.C. section 360bbb-3(b)(1), unless the authorization is terminated or revoked.  Performed at [facility], [address] [place], [place] [address]   Resp Panel by RT-PCR (Flu A&B, Covid) Nasopharyngeal Swab     Status: None   Collection Time: 04/04/20  6:49 AM   Specimen: Nasopharyngeal Swab; Nasopharyngeal(NP) swabs in vial transport medium  Result Value Ref Range Status   SARS Coronavirus 2 by RT PCR NEGATIVE NEGATIVE Final    Comment: (NOTE) SARS-CoV-2 target nucleic acids are NOT DETECTED.  The SARS-CoV-2 RNA is generally detectable in upper respiratory specimens during the acute phase of infection. The lowest concentration of SARS-CoV-2 viral copies this assay can detect is 138 copies/mL. A negative result does not preclude SARS-Cov-2 infection and should not be used as the sole basis for treatment or other patient management decisions. A negative result may occur with  improper specimen collection/handling, submission of specimen other than nasopharyngeal swab, presence of viral mutation(s) within the areas targeted by this assay, and inadequate number of viral copies(<138 copies/mL). A negative result must be combined with clinical observations, patient history, and epidemiological information. The expected result is Negative.  Fact Sheet for Patients:  EntrepreneurPulse.com.au  Fact Sheet for Healthcare  Providers:  IncredibleEmployment.be  This test is no t yet approved or cleared by the [place] FDA and  has been authorized for detection and/or diagnosis of SARS-CoV-2 by FDA under an Emergency Use Authorization (EUA). This EUA will remain  in effect (meaning this test can be used) for the duration of the COVID-19 declaration under Section 564(b)(1) of the Act, 21 U.S.C.section 360bbb-3(b)(1), unless the authorization is terminated  or revoked sooner.       Influenza A by PCR NEGATIVE NEGATIVE Final   Influenza B by PCR NEGATIVE NEGATIVE Final    Comment: (NOTE) The Xpert Xpress SARS-CoV-2/FLU/RSV plus assay is intended as an aid in the diagnosis of influenza from Nasopharyngeal swab specimens and should not be used as a sole basis for treatment. Nasal washings and aspirates are unacceptable for Xpert Xpress SARS-CoV-2/FLU/RSV testing.  Fact Sheet for Patients: EntrepreneurPulse.com.au  Fact Sheet for Healthcare Providers: IncredibleEmployment.be  This test is not yet approved or cleared by the [place] FDA and has been authorized for detection and/or diagnosis of SARS-CoV-2 by FDA under an Emergency Use Authorization (EUA). This EUA will remain in effect (meaning this test can be used) for the duration of the COVID-19 declaration under Section 564(b)(1) of the Act, 21 U.S.C. section 360bbb-3(b)(1), unless the authorization is terminated or revoked.  Performed at [facility], [address], [place], [place] [address]      Radiological Exams on Admission: DG Chest 1 View  Result Date: 04/04/2020 CLINICAL DATA:  Fall.  Pain. EXAM: CHEST  1 VIEW COMPARISON:  Chest x-ray 09/05/2017. FINDINGS: Calcified mediastinum hilar lymph nodes consistent prior granulomas disease again noted. Cardiomegaly. No pulmonary venous congestion. Persistent stable small density noted the right mid lung consistent with  scarring. Persistent right mid pleuroparenchymal thickening consistent scarring. No acute infiltrate. No pleural effusion or pneumothorax. Prominent loops of bowel, most likely colon, again noted in the upper abdomen. Surgical clips upper abdomen. Slightly displaced right anterior 6 rib fracture noted. IMPRESSION: 1. Stable small density right mid lung consistent with scarring. Right mid lung pleural-parenchymal  thickening consistent with scarring. Evidence of prior granulomas disease. No acute pulmonary disease noted. 2.  Cardiomegaly.  No pulmonary venous congestion. 3.  Displaced right anterior 6 rib fracture noted.  No pneumothorax. Electronically Signed   By: Marcello Moores  Register   On: 04/04/2020 07:37   DG Pelvis 1-2 Views  Result Date: 04/04/2020 CLINICAL DATA:  Fall.  Pain. EXAM: PELVIS - 1-2 VIEW COMPARISON:  No prior. FINDINGS: Degenerative changes lumbar spine and both hips. No acute bony abnormality. No evidence of fracture dislocation. Aortoiliac atherosclerotic vascular calcification. Pelvic calcifications consistent phleboliths. IMPRESSION: Degenerative changes lumbar spine and both hips. No acute abnormality. Electronically Signed   By: Marcello Moores  Register   On: 04/04/2020 07:25   CT Head Wo Contrast  Result Date: 04/04/2020 CLINICAL DATA:  Fall with facial trauma EXAM: CT HEAD WITHOUT CONTRAST CT MAXILLOFACIAL WITHOUT CONTRAST CT CERVICAL SPINE WITHOUT CONTRAST TECHNIQUE: Multidetector CT imaging of the head, cervical spine, and maxillofacial structures were performed using the standard protocol without intravenous contrast. Multiplanar CT image reconstructions of the cervical spine and maxillofacial structures were also generated. COMPARISON:  Head CT 04/02/2020 FINDINGS: CT HEAD FINDINGS Brain: No evidence of acute infarction, hemorrhage, hydrocephalus, extra-axial collection or mass lesion/mass effect. Age normal appearance of the brain. Vascular: No hyperdense vessel or unexpected  calcification. Skull: Large right forehead hematoma.  No calvarial fracture. CT MAXILLOFACIAL FINDINGS Osseous: Minimally depressed right orbital floor fracture. Nondisplaced fractures of the right medial and lateral pterygoid plates with probable continuation along the wall of the right nasal cavity, but no complete LeFort injury. The mandible is intact and located. Large periapical erosion around tooth 3, dehiscent into the sinus. Orbits: Mild extraconal hemorrhage along the orbital floor fracture on the right. Bilateral cataract resection. Sinuses: Low-density/inflammatory appearing mucosal thickening at the right more than left maxillary sinus. Soft tissues: Large hematoma above the right orbit. CT CERVICAL SPINE FINDINGS Alignment: No traumatic malalignment Skull base and vertebrae: No acute fracture Soft tissues and spinal canal: No prevertebral fluid or swelling. No visible canal hematoma. Disc levels:  Ordinary degenerative changes Upper chest: No evidence of injury IMPRESSION: 1. No evidence of intracranial or cervical spine injury. 2. Minimally depressed right orbital floor fracture. 3. Nondisplaced right pterygoid plate fractures. No complete LeFort injury. Electronically Signed   By: Monte Fantasia M.D.   On: 04/04/2020 07:20   CT Cervical Spine Wo Contrast  Result Date: 04/04/2020 CLINICAL DATA:  Fall with facial trauma EXAM: CT HEAD WITHOUT CONTRAST CT MAXILLOFACIAL WITHOUT CONTRAST CT CERVICAL SPINE WITHOUT CONTRAST TECHNIQUE: Multidetector CT imaging of the head, cervical spine, and maxillofacial structures were performed using the standard protocol without intravenous contrast. Multiplanar CT image reconstructions of the cervical spine and maxillofacial structures were also generated. COMPARISON:  Head CT 04/02/2020 FINDINGS: CT HEAD FINDINGS Brain: No evidence of acute infarction, hemorrhage, hydrocephalus, extra-axial collection or mass lesion/mass effect. Age normal appearance of the brain.  Vascular: No hyperdense vessel or unexpected calcification. Skull: Large right forehead hematoma.  No calvarial fracture. CT MAXILLOFACIAL FINDINGS Osseous: Minimally depressed right orbital floor fracture. Nondisplaced fractures of the right medial and lateral pterygoid plates with probable continuation along the wall of the right nasal cavity, but no complete LeFort injury. The mandible is intact and located. Large periapical erosion around tooth 3, dehiscent into the sinus. Orbits: Mild extraconal hemorrhage along the orbital floor fracture on the right. Bilateral cataract resection. Sinuses: Low-density/inflammatory appearing mucosal thickening at the right more than left maxillary sinus. Soft tissues: Large hematoma  above the right orbit. CT CERVICAL SPINE FINDINGS Alignment: No traumatic malalignment Skull base and vertebrae: No acute fracture Soft tissues and spinal canal: No prevertebral fluid or swelling. No visible canal hematoma. Disc levels:  Ordinary degenerative changes Upper chest: No evidence of injury IMPRESSION: 1. No evidence of intracranial or cervical spine injury. 2. Minimally depressed right orbital floor fracture. 3. Nondisplaced right pterygoid plate fractures. No complete LeFort injury. Electronically Signed   By: Monte Fantasia M.D.   On: 04/04/2020 07:20   ECHOCARDIOGRAM COMPLETE  Result Date: 04/04/2020    ECHOCARDIOGRAM REPORT   Patient Name:   TSUTOMU BARFOOT Date of Exam: 04/04/2020 Medical Rec #:  [id number]          Height:       71.0 in Accession #:    [id number]         Weight:       184.1 lb Date of Birth:  08-24-1940          BSA:          2.036 m Patient Age:    89 years           BP:           170/92 mmHg Patient Gender: M                  HR:           65 bpm. Exam Location:  [facility] Procedure: 2D Echo, Color Doppler and Cardiac Doppler Indications:     Elevated Troponin  History:         Patient has no prior history of Echocardiogram examinations.                  Risk  Factors:Hypertension and Dyslipidemia.  Sonographer:     Sherrie Sport RDCS (AE) Referring Phys:  [id number] Arvil Chaco Diagnosing Phys: Kathlyn Sacramento MD  Sonographer Comments: Technically difficult study due to poor echo windows and no apical window. Image acquisition challenging due to respiratory motion. IMPRESSIONS  1. Left ventricular ejection fraction, by estimation, is 55 to 60%. The left ventricle has normal function. Left ventricular endocardial border not optimally defined to evaluate regional wall motion. There is mild left ventricular hypertrophy. Left ventricular diastolic function could not be evaluated.  2. Right ventricular systolic function is normal. The right ventricular size is normal. Tricuspid regurgitation signal is inadequate for assessing PA pressure.  3. The mitral valve is normal in structure. No evidence of mitral valve regurgitation. No evidence of mitral stenosis.  4. The aortic valve is normal in structure. Aortic valve regurgitation is not visualized. No aortic stenosis is present. FINDINGS  Left Ventricle: Left ventricular ejection fraction, by estimation, is 55 to 60%. The left ventricle has normal function. Left ventricular endocardial border not optimally defined to evaluate regional wall motion. The left ventricular internal cavity size was normal in size. There is mild left ventricular hypertrophy. Left ventricular diastolic function could not be evaluated. Right Ventricle: The right ventricular size is normal. No increase in right ventricular wall thickness. Right ventricular systolic function is normal. Tricuspid regurgitation signal is inadequate for assessing PA pressure. Left Atrium: Left atrial size was normal in size. Right Atrium: Right atrial size was normal in size. Pericardium: There is no evidence of pericardial effusion. Mitral Valve: The mitral valve is normal in structure. No evidence of mitral valve regurgitation. No evidence of mitral valve stenosis.  Tricuspid Valve: The tricuspid valve is normal in structure.  Tricuspid valve regurgitation is not demonstrated. No evidence of tricuspid stenosis. Aortic Valve: The aortic valve is normal in structure. Aortic valve regurgitation is not visualized. No aortic stenosis is present. Pulmonic Valve: The pulmonic valve was normal in structure. Pulmonic valve regurgitation is not visualized. No evidence of pulmonic stenosis. Aorta: The aortic root is normal in size and structure. Venous: The inferior vena cava was not well visualized. IAS/Shunts: No atrial level shunt detected by color flow Doppler.  LEFT VENTRICLE PLAX 2D LVIDd:         4.59 cm LVIDs:         3.30 cm LV PW:         1.14 cm LV IVS:        1.16 cm LVOT diam:     2.00 cm LVOT Area:     3.14 cm  LEFT ATRIUM         Index LA diam:    4.40 cm 2.16 cm/m                        PULMONIC VALVE AORTA                 PV Vmax:        0.84 m/s Ao Root diam: 3.20 cm PV Peak grad:   2.8 mmHg                       RVOT Peak grad: 4 mmHg  TRICUSPID VALVE TR Peak grad:   17.0 mmHg TR Vmax:        206.00 cm/s  SHUNTS Systemic Diam: 2.00 cm Kathlyn Sacramento MD Electronically signed by Kathlyn Sacramento MD Signature Date/Time: 04/04/2020/3:15:12 PM    Final    CT Maxillofacial WO CM  Result Date: 04/04/2020 CLINICAL DATA:  Fall with facial trauma EXAM: CT HEAD WITHOUT CONTRAST CT MAXILLOFACIAL WITHOUT CONTRAST CT CERVICAL SPINE WITHOUT CONTRAST TECHNIQUE: Multidetector CT imaging of the head, cervical spine, and maxillofacial structures were performed using the standard protocol without intravenous contrast. Multiplanar CT image reconstructions of the cervical spine and maxillofacial structures were also generated. COMPARISON:  Head CT 04/02/2020 FINDINGS: CT HEAD FINDINGS Brain: No evidence of acute infarction, hemorrhage, hydrocephalus, extra-axial collection or mass lesion/mass effect. Age normal appearance of the brain. Vascular: No hyperdense vessel or unexpected  calcification. Skull: Large right forehead hematoma.  No calvarial fracture. CT MAXILLOFACIAL FINDINGS Osseous: Minimally depressed right orbital floor fracture. Nondisplaced fractures of the right medial and lateral pterygoid plates with probable continuation along the wall of the right nasal cavity, but no complete LeFort injury. The mandible is intact and located. Large periapical erosion around tooth 3, dehiscent into the sinus. Orbits: Mild extraconal hemorrhage along the orbital floor fracture on the right. Bilateral cataract resection. Sinuses: Low-density/inflammatory appearing mucosal thickening at the right more than left maxillary sinus. Soft tissues: Large hematoma above the right orbit. CT CERVICAL SPINE FINDINGS Alignment: No traumatic malalignment Skull base and vertebrae: No acute fracture Soft tissues and spinal canal: No prevertebral fluid or swelling. No visible canal hematoma. Disc levels:  Ordinary degenerative changes Upper chest: No evidence of injury IMPRESSION: 1. No evidence of intracranial or cervical spine injury. 2. Minimally depressed right orbital floor fracture. 3. Nondisplaced right pterygoid plate fractures. No complete [place] injury. Electronically Signed   By: Monte Fantasia M.D.   On: 04/04/2020 07:20     EKG: I have personally reviewed.  Sinus rhythm, QTC 457,  LAD, atypical right bundle blockade.  Assessment/Plan Principal Problem:   Elevated troponin Active Problems:   Essential hypertension   Mixed hyperlipidemia   Hyponatremia   Fall at home, initial encounter   Rhabdomyolysis   Hypokalemia   Thrombocytopenia (HCC)   Diarrhea   Right rib fracture   Lactic acidosis   Fever   Orbital floor fracture (HCC)   Pterygoid plate fracture (HCC)   Abnormal LFTs   Acute metabolic encephalopathy   UTI (urinary tract infection)   Elevated troponin: trop 85 -->195.  Possibly due to demand ischemia.  Patient is not good candidate for using IV heparin due to acute  multiple injury and hematoma.  Dr. Fletcher Anon of cardiology is consulted.  - admit to progressive unit as inpatient - Trend Trop - Repeat EKG in the am  - aspirin, lipitor  - Risk factor stratification: will check FLP and A1C  - 2d echo  Essential hypertension: pt is not taking meds currently. Bp 170/92 -IV hydralazine as needed -will add amlodpine 10 mg daily  Mixed hyperlipidemia -Lipitor  Hyponatremia: Most likely due to poor oral intake, GI loss and dehydration - Will check urine sodium, urine osmolality, serum osmolality. - check TSH - IVF: 1L LR in ED, will continue with IV normal saline at 75 mL/h - f/u by BMP q8h - avoid over correction too fast due to risk of central pontine myelinolysis  Fall at home, initial encounter and Orbital floor fracture: -pt /ot when able to (not ordered yet) -Dr. Ophthalmologist, Dr. Linton Flemings is consulted  Pterygoid plate fracture: Initially I consulted Dr. Lacinda Axon of neurosurgeon, who recommended to consult ENT since this is not an intracranial issue. -ENT, Dr. Kathyrn Sheriff is consulted  Rhabdomyolysis: CK 1056. Renal function is okay -IV fluid as above -Repeat CK in morning  Hypokalemia: K= 3.3  on admission. - Repleted - Check Mg level -->1.8  Thrombocytopenia ([place]): -check LDH and peripheral smear  Diarrhea -check C diff and GI path  Right rib fracture: -pain control: As needed Percocet -incentive spirometry  Lactic acidosis: Lactic acid 3.3.  Patient does not meet criteria for sepsis.  Possibly due to dehydration -trend lactic acid -IVF as above  Fever and UTI (urinary tract infection) -IV rocephin -f/u blood culture and urine culture  Abnormal LFTs -check hepatitis panel -Follow-up right upper quadrant ultrasound  Acute metabolic encephalopathy: Likely multifactorial etiology -Frequent neuro check -check ammonia level    DVT ppx: SCD Code Status: DNR per his wife (I discussed with his wife, and explained the meaning of  CODE STATUS. His wife said the pt will wants be DNR)  Family Communication:   Yes, patient's wife Disposition Plan:  Anticipate discharge back to previous environment Consults called:  Dr. Fletcher Anon of card, Dr. Kathyrn Sheriff of ENT, Dr. Linton Flemings of ophthalmology Admission status:  progressive unit as inpt       Status is: Inpatient  Remains inpatient appropriate because:Inpatient level of care appropriate due to severity of illness.  Patient has multiple comorbidities, now presents with multiple acute issues, including elevated troponin, hyponatremia, fall, rhabdomyolysis, hypokalemia, thrombocytopenia, diarrhea, rib fracture, lactic acidosis, fever, orbital floor fracture, pterygoid plate fracture, abnormal liver function, UTI, and acute metabolic encephalopathy.  His presentation is highly complicated.  Patient is at high risk of deteriorating.  Need to be treated in hospital for at least 2 days.   Dispo: The patient is from: Home              Anticipated d/c is to:  Home              Anticipated d/c date is: 2 days              Patient currently is not medically stable to d/c.          Date of Service 04/04/2020    [facility]   If 7PM-7AM, please contact night-coverage [email or web address] 04/04/2020, 8:02 PM

## 2020-04-04 NOTE — Progress Notes (Addendum)
ANTICOAGULATION CONSULT NOTE - Initial Consult  Pharmacy Consult for Heparin Infusion  Indication: chest pain/ACS  No Known Allergies  Patient Measurements: Height: 5\' 11"  (180.3 cm) Weight: 83.5 kg (184 lb 1.4 oz) IBW/kg (Calculated) : 75.3  Vital Signs: Temp: 98.7 F (37.1 C) (12/20 0946) Temp Source: Oral (12/20 0946) BP: 170/92 (12/20 0946) Pulse Rate: 65 (12/20 0946)  Labs: Recent Labs    04/02/20 0548 04/04/20 0649 04/04/20 0955  HGB 13.6 12.5*  --   HCT 39.4 34.4*  --   PLT 126* 106*  --   CREATININE 0.99 1.07  --   CKTOTAL  --  1,056*  --   TROPONINIHS  --  85* 195*    Estimated Creatinine Clearance: 58.6 mL/min (by C-G formula based on SCr of 1.07 mg/dL).   Medical History: Past Medical History:  Diagnosis Date  . Chicken pox   . Hearing loss   . Hyperlipidemia   . Hypertension   . Ulcer     Assessment: 79 yo male presented to ED from home after a fall. Patient noted to have hematoma above right eye. CT Head of head showed right orbital floor fracture amd nondisplaced right pterygoid plate fractures.  Troponin HS trending up 85>>195. Pharmacy consulted for heparin infusion for ACS/NSTEMI. Baseline Hgb 12.5 and plts 106.  Goal of Therapy:  Heparin level 0.3-0.7 units/ml Monitor platelets by anticoagulation protocol: Yes   Plan: Baseline labs ordered. RN contacted.   Give 4000 units bolus x 1 Start heparin infusion at 950 units/hr Check anti-Xa level in 8 hours and daily while on heparin Continue to monitor H&H and platelets  Pernell Dupre, PharmD, BCPS Clinical Pharmacist 04/04/2020 10:59 AM

## 2020-04-04 NOTE — ED Provider Notes (Signed)
[facility] Emergency Department Provider Note  ____________________________________________  Time seen: Approximately 10:52 AM  I have reviewed the triage vital signs and the nursing notes.   HISTORY  Chief Complaint Fall    Level 5 Caveat: Portions of the History and Physical including HPI and review of systems are unable to be completely obtained due to patient being a poor historian   HPI Travis Craig is a 79 y.o. male with a history of dementia hypertension hyperlipidemia who is brought to the ED this morning after a fall.  He has had several falls recently.  Last night he got out of bed at about 5:00 AM, reportedly thought he was getting ready for work, and was walking outside carrying a box  of various items with him.  His wife later noted that he had inadvertently tied the shoelaces of his 2 shoes together.  Before leaving the property, he tripped and fell, hitting his head and remaining on the ground in 30 degree temperature until the paper delivery person found him.  Patient denies pain or any acute symptoms.  He was recently evaluated for a fall in the emergency department, had unremarkable work-up, treated for dehydration, and was discharged home.  Spouse states that this degree of confusion is abnormal for him and feels that his dementia seems to be rapidly worsening lately.  She also notes nonbloody watery diarrhea for the past several days.    Past Medical History:  Diagnosis Date  . Chicken pox   . Hearing loss   . Hyperlipidemia   . Hypertension   . Ulcer      Patient Active Problem List   Diagnosis Date Noted  . Elevated troponin 04/04/2020  . Elevated hemoglobin A1c 07/06/2019  . Dementia ([place]) 01/07/2019  . 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 Hyponatremia 05/13/2017  . 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 Anemia 03/22/2017  . Essential hypertension 02/29/2016  . Mixed  hyperlipidemia 02/29/2016     Past Surgical History:  Procedure Laterality Date  . BLEPHAROPLASTY    . CHOLECYSTECTOMY    . Ulcer surgery       Prior to Admission medications   Medication Sig Start Date End Date Taking? Authorizing Provider  albuterol (VENTOLIN HFA) 108 (90 Base) MCG/ACT inhaler Inhale 2 puffs into the lungs every 4 (four) hours as needed.    [provider]  aspirin EC 81 MG tablet Take 81 mg by mouth daily.    [provider]  atorvastatin (LIPITOR) 20 MG tablet TAKE 1 TABLET DAILY 10/20/19   Karamalegos, Devonne Doughty, DO  calcium-vitamin D (OSCAL WITH D) 500-200 MG-UNIT tablet Take 1 tablet by mouth.    [provider]  Coenzyme Q10 (COQ10 PO) Take by mouth.     [provider]  fluticasone (FLONASE) 50 MCG/ACT nasal spray Place 1 spray into both nostrils 2 (two) times daily. 06/19/19   [provider]  ipratropium (ATROVENT) 0.06 % nasal spray Place 2 sprays into both nostrils 4 (four) times daily as needed for rhinitis. 05/19/19   Karamalegos, Devonne Doughty, DO  Multiple Vitamin (MULTI-VITAMIN DAILY PO) Take by mouth.    [provider]  Omega-3 1400 MG CAPS Take 1,600 mg by mouth.     [provider]     Allergies Patient has no known allergies.   Family History  Problem Relation Age of Onset  . Hypertension Father   . Alzheimer's disease Father  Social History Social History   Tobacco Use  . Smoking status: Never Smoker  . Smokeless tobacco: Never Used  Vaping Use  . Vaping Use: Never used  Substance Use Topics  . Alcohol use: Yes    Alcohol/week: 2.0 standard drinks    Types: 2 Glasses of wine per week  . Drug use: No    Review of Systems Level 5 Caveat: Portions of the History and Physical including HPI and review of systems are unable to be completely obtained due to patient being a poor historian   Constitutional:   No known fever.  ENT:   No rhinorrhea. Cardiovascular:   No  chest pain or syncope. Respiratory:   No dyspnea or cough. Gastrointestinal:   Negative for abdominal pain, vomiting and diarrhea.  Musculoskeletal:   Negative for focal pain or swelling ____________________________________________   PHYSICAL EXAM:  VITAL SIGNS: ED Triage Vitals  Enc Vitals Group     BP 04/04/20 0619 (!) 144/80     Pulse Rate 04/04/20 0619 68     Resp 04/04/20 0619 17     Temp 04/04/20 0619 (!) 100.7 F (38.2 C)     Temp Source 04/04/20 0619 Oral     SpO2 04/04/20 0619 97 %     Weight 04/04/20 0621 184 lb 1.4 oz (83.5 kg)     Height 04/04/20 0621 5\' 11"  (1.803 m)     Head Circumference --      Peak Flow --      Pain Score --      Pain Loc --      Pain Edu? --      Excl. in [place]? --     Vital signs reviewed, nursing assessments reviewed.   Constitutional:   Awake, not alert, not oriented. Non-toxic appearance. Eyes:   Conjunctivae are normal. EOMI. PERRL. ENT      Head:   Normocephalic with large soft hematoma over the right forehead and periorbital face.  No exophthalmos or enophthalmos.  There is a 3.5 cm curvilinear laceration overlying the right eyebrow with some oozing nonpulsatile bleeding.      Nose:   No congestion/rhinnorhea.  Dried blood around the nose without septal hematoma or epistaxis      Mouth/Throat:   MMM, no pharyngeal erythema. No peritonsillar mass.       Neck:   No meningismus. Full ROM.  No midline spinal tenderness Hematological/Lymphatic/Immunilogical:   No cervical lymphadenopathy. Cardiovascular:   RRR. Symmetric bilateral radial and DP pulses.  No murmurs. Cap refill less than 2 seconds. Respiratory:   Normal respiratory effort without tachypnea/retractions. Breath sounds are clear and equal bilaterally. No wheezes/rales/rhonchi. Gastrointestinal:   Soft and nontender. Non distended. There is no CVA tenderness.  No rebound, rigidity, or guarding.  Musculoskeletal:   Normal range of motion in all extremities. No joint effusions.   No lower extremity tenderness.  No edema. Neurologic:   Normal speech, incoherent/inappropriate responses to conversation Motor grossly intact. No acute focal neurologic deficits are appreciated.  Skin:    Skin is warm, dry with face laceration as above. No rash noted.  No petechiae, purpura, or bullae.  ____________________________________________    LABS (pertinent positives/negatives) (all labs ordered are listed, but only abnormal results are displayed) Labs Reviewed  LACTIC ACID, PLASMA - Abnormal; Notable for the following components:      Result Value   Lactic Acid, Venous 3.3 (*)    All other components within normal limits  CBC WITH DIFFERENTIAL/PLATELET -  Abnormal; Notable for the following components:   RBC 3.53 (*)    Hemoglobin 12.5 (*)    HCT 34.4 (*)    MCH 35.4 (*)    MCHC 36.3 (*)    Platelets 106 (*)    Lymphs Abs 0.3 (*)    All other components within normal limits  COMPREHENSIVE METABOLIC PANEL - Abnormal; Notable for the following components:   Sodium 128 (*)    Potassium 3.3 (*)    Chloride 97 (*)    CO2 21 (*)    Glucose, Bld 109 (*)    Calcium 8.6 (*)    Albumin 3.4 (*)    AST 159 (*)    ALT 52 (*)    Total Bilirubin 1.5 (*)    All other components within normal limits  CK - Abnormal; Notable for the following components:   Total CK 1,056 (*)    All other components within normal limits  TROPONIN I (HIGH SENSITIVITY) - Abnormal; Notable for the following components:   Troponin I (High Sensitivity) 85 (*)    All other components within normal limits  TROPONIN I (HIGH SENSITIVITY) - Abnormal; Notable for the following components:   Troponin I (High Sensitivity) 195 (*)    All other components within normal limits  RESP PANEL BY RT-PCR (FLU A&B, COVID) ARPGX2  CULTURE, BLOOD (ROUTINE X 2)  CULTURE, BLOOD (ROUTINE X 2)  MAGNESIUM  LACTIC ACID, PLASMA  URINALYSIS, COMPLETE (UACMP) WITH MICROSCOPIC  APTT  PROTIME-INR    ____________________________________________   EKG  Interpreted by me Normal sinus rhythm rate of 61, normal axis and intervals.  Voltage criteria for LVH with repolarization abnormality in the high lateral leads.  No ischemic changes.  ____________________________________________    QMGNOIBBC  DG Chest 1 View  Result Date: 04/04/2020 CLINICAL DATA:  Fall.  Pain. EXAM: CHEST  1 VIEW COMPARISON:  Chest x-ray 09/05/2017. FINDINGS: Calcified mediastinum hilar lymph nodes consistent prior granulomas disease again noted. Cardiomegaly. No pulmonary venous congestion. Persistent stable small density noted the right mid lung consistent with scarring. Persistent right mid pleuroparenchymal thickening consistent scarring. No acute infiltrate. No pleural effusion or pneumothorax. Prominent loops of bowel, most likely colon, again noted in the upper abdomen. Surgical clips upper abdomen. Slightly displaced right anterior 6 rib fracture noted. IMPRESSION: 1. Stable small density right mid lung consistent with scarring. Right mid lung pleural-parenchymal thickening consistent with scarring. Evidence of prior granulomas disease. No acute pulmonary disease noted. 2.  Cardiomegaly.  No pulmonary venous congestion. 3.  Displaced right anterior 6 rib fracture noted.  No pneumothorax. Electronically Signed   By: Marcello Moores  Register   On: 04/04/2020 07:37   DG Pelvis 1-2 Views  Result Date: 04/04/2020 CLINICAL DATA:  Fall.  Pain. EXAM: PELVIS - 1-2 VIEW COMPARISON:  No prior. FINDINGS: Degenerative changes lumbar spine and both hips. No acute bony abnormality. No evidence of fracture dislocation. Aortoiliac atherosclerotic vascular calcification. Pelvic calcifications consistent phleboliths. IMPRESSION: Degenerative changes lumbar spine and both hips. No acute abnormality. Electronically Signed   By: Marcello Moores  Register   On: 04/04/2020 07:25   CT Head Wo Contrast  Result Date: 04/04/2020 CLINICAL DATA:  Fall  with facial trauma EXAM: CT HEAD WITHOUT CONTRAST CT MAXILLOFACIAL WITHOUT CONTRAST CT CERVICAL SPINE WITHOUT CONTRAST TECHNIQUE: Multidetector CT imaging of the head, cervical spine, and maxillofacial structures were performed using the standard protocol without intravenous contrast. Multiplanar CT image reconstructions of the cervical spine and maxillofacial structures were also generated. COMPARISON:  Head CT 04/02/2020 FINDINGS: CT HEAD FINDINGS Brain: No evidence of acute infarction, hemorrhage, hydrocephalus, extra-axial collection or mass lesion/mass effect. Age normal appearance of the brain. Vascular: No hyperdense vessel or unexpected calcification. Skull: Large right forehead hematoma.  No calvarial fracture. CT MAXILLOFACIAL FINDINGS Osseous: Minimally depressed right orbital floor fracture. Nondisplaced fractures of the right medial and lateral pterygoid plates with probable continuation along the wall of the right nasal cavity, but no complete LeFort injury. The mandible is intact and located. Large periapical erosion around tooth 3, dehiscent into the sinus. Orbits: Mild extraconal hemorrhage along the orbital floor fracture on the right. Bilateral cataract resection. Sinuses: Low-density/inflammatory appearing mucosal thickening at the right more than left maxillary sinus. Soft tissues: Large hematoma above the right orbit. CT CERVICAL SPINE FINDINGS Alignment: No traumatic malalignment Skull base and vertebrae: No acute fracture Soft tissues and spinal canal: No prevertebral fluid or swelling. No visible canal hematoma. Disc levels:  Ordinary degenerative changes Upper chest: No evidence of injury IMPRESSION: 1. No evidence of intracranial or cervical spine injury. 2. Minimally depressed right orbital floor fracture. 3. Nondisplaced right pterygoid plate fractures. No complete LeFort injury. Electronically Signed   By: Monte Fantasia M.D.   On: 04/04/2020 07:20   CT Cervical Spine Wo  Contrast  Result Date: 04/04/2020 CLINICAL DATA:  Fall with facial trauma EXAM: CT HEAD WITHOUT CONTRAST CT MAXILLOFACIAL WITHOUT CONTRAST CT CERVICAL SPINE WITHOUT CONTRAST TECHNIQUE: Multidetector CT imaging of the head, cervical spine, and maxillofacial structures were performed using the standard protocol without intravenous contrast. Multiplanar CT image reconstructions of the cervical spine and maxillofacial structures were also generated. COMPARISON:  Head CT 04/02/2020 FINDINGS: CT HEAD FINDINGS Brain: No evidence of acute infarction, hemorrhage, hydrocephalus, extra-axial collection or mass lesion/mass effect. Age normal appearance of the brain. Vascular: No hyperdense vessel or unexpected calcification. Skull: Large right forehead hematoma.  No calvarial fracture. CT MAXILLOFACIAL FINDINGS Osseous: Minimally depressed right orbital floor fracture. Nondisplaced fractures of the right medial and lateral pterygoid plates with probable continuation along the wall of the right nasal cavity, but no complete LeFort injury. The mandible is intact and located. Large periapical erosion around tooth 3, dehiscent into the sinus. Orbits: Mild extraconal hemorrhage along the orbital floor fracture on the right. Bilateral cataract resection. Sinuses: Low-density/inflammatory appearing mucosal thickening at the right more than left maxillary sinus. Soft tissues: Large hematoma above the right orbit. CT CERVICAL SPINE FINDINGS Alignment: No traumatic malalignment Skull base and vertebrae: No acute fracture Soft tissues and spinal canal: No prevertebral fluid or swelling. No visible canal hematoma. Disc levels:  Ordinary degenerative changes Upper chest: No evidence of injury IMPRESSION: 1. No evidence of intracranial or cervical spine injury. 2. Minimally depressed right orbital floor fracture. 3. Nondisplaced right pterygoid plate fractures. No complete LeFort injury. Electronically Signed   By: Monte Fantasia M.D.    On: 04/04/2020 07:20   CT Maxillofacial WO CM  Result Date: 04/04/2020 CLINICAL DATA:  Fall with facial trauma EXAM: CT HEAD WITHOUT CONTRAST CT MAXILLOFACIAL WITHOUT CONTRAST CT CERVICAL SPINE WITHOUT CONTRAST TECHNIQUE: Multidetector CT imaging of the head, cervical spine, and maxillofacial structures were performed using the standard protocol without intravenous contrast. Multiplanar CT image reconstructions of the cervical spine and maxillofacial structures were also generated. COMPARISON:  Head CT 04/02/2020 FINDINGS: CT HEAD FINDINGS Brain: No evidence of acute infarction, hemorrhage, hydrocephalus, extra-axial collection or mass lesion/mass effect. Age normal appearance of the brain. Vascular: No hyperdense vessel or unexpected calcification. Skull:  Large right forehead hematoma.  No calvarial fracture. CT MAXILLOFACIAL FINDINGS Osseous: Minimally depressed right orbital floor fracture. Nondisplaced fractures of the right medial and lateral pterygoid plates with probable continuation along the wall of the right nasal cavity, but no complete LeFort injury. The mandible is intact and located. Large periapical erosion around tooth 3, dehiscent into the sinus. Orbits: Mild extraconal hemorrhage along the orbital floor fracture on the right. Bilateral cataract resection. Sinuses: Low-density/inflammatory appearing mucosal thickening at the right more than left maxillary sinus. Soft tissues: Large hematoma above the right orbit. CT CERVICAL SPINE FINDINGS Alignment: No traumatic malalignment Skull base and vertebrae: No acute fracture Soft tissues and spinal canal: No prevertebral fluid or swelling. No visible canal hematoma. Disc levels:  Ordinary degenerative changes Upper chest: No evidence of injury IMPRESSION: 1. No evidence of intracranial or cervical spine injury. 2. Minimally depressed right orbital floor fracture. 3. Nondisplaced right pterygoid plate fractures. No complete LeFort injury.  Electronically Signed   By: Monte Fantasia M.D.   On: 04/04/2020 07:20    ____________________________________________   PROCEDURES .Marland KitchenLaceration Repair  Date/Time: 04/04/2020 11:10 AM Performed by: Carrie Mew, MD Authorized by: Carrie Mew, MD   Consent:    Consent obtained:  Verbal   Consent given by:  Patient   Risks discussed:  Infection, pain, retained foreign body, poor cosmetic result and poor wound healing Universal protocol:    Patient identity confirmed:  Arm band Anesthesia:    Anesthesia method:  Local infiltration   Local anesthetic:  Lidocaine 1% WITH epi Laceration details:    Location:  Scalp   Scalp location:  Frontal   Length (cm):  3.5 Pre-procedure details:    Preparation:  Patient was prepped and draped in usual sterile fashion and imaging obtained to evaluate for foreign bodies Exploration:    Limited defect created (wound extended): no     Hemostasis achieved with:  Direct pressure and epinephrine   Imaging obtained comment:  CT   Imaging outcome: foreign body not noted     Wound exploration: entire depth of wound visualized     Wound extent: no foreign bodies/material noted, no nerve damage noted and no underlying fracture noted     Contaminated: no   Treatment:    Area cleansed with:  Saline and povidone-iodine   Amount of cleaning:  Extensive   Irrigation solution:  Sterile saline   Irrigation method:  Pressure wash   Visualized foreign bodies/material removed: no     Debridement:  None   Undermining:  None Skin repair:    Repair method:  Sutures   Suture size:  4-0   Wound skin closure material used: vicryl.   Suture technique:  Running   Number of sutures:  6 Approximation:    Approximation:  Close Repair type:    Repair type:  Simple Post-procedure details:    Dressing:  Sterile dressing   Procedure completion:  Tolerated well, no immediate complications Comments:            ____________________________________________  DIFFERENTIAL DIAGNOSIS   Intracranial hemorrhage, C-spine fracture, facial fracture, non-STEMI, dehydration, electrolyte abnormality, Covid  CLINICAL IMPRESSION / ASSESSMENT AND PLAN / ED COURSE  Medications ordered in the ED: Medications  lactated ringers bolus 1,000 mL (1,000 mLs Intravenous New Bag/Given 04/04/20 0945)  lidocaine-EPINEPHrine (XYLOCAINE W/EPI) 2 %-1:100000 (with pres) injection 20 mL (20 mLs Infiltration Given by Other 04/04/20 0948)  Tdap (BOOSTRIX) injection 0.5 mL (0.5 mLs Intramuscular Given 04/04/20 0948)  Pertinent labs & imaging results that were available during my care of the patient were reviewed by me and considered in my medical decision making (see chart for details).   Travis Craig was evaluated in Emergency Department on 04/04/2020 for the symptoms described in the history of present illness. He was evaluated in the context of the global COVID-19 pandemic, which necessitated consideration that the patient might be at risk for infection with the SARS-CoV-2 virus that causes COVID-19. Institutional protocols and algorithms that pertain to the evaluation of patients at risk for COVID-19 are in a state of rapid change based on information released by regulatory bodies including the CDC and federal and state organizations. These policies and algorithms were followed during the patient's care in the ED.   Patient brought to ED after a fall and prolonged time on the ground in the cold.  He is actually febrile on arrival, other vitals unremarkable.  Labs show hyponatremia which is new compared to a few days ago.  Clinical Course as of 04/04/20 1116  Mon Apr 04, 2020  0732 Cxr image viewed by me, appears unremarkable. No ptx/pna/effusion/edema. No fx or sub Q emphysema [PS]  0071 CT significant for orbital fractures without LeFort injury.  XR pelvis unremarkable. [PS]  2197 CXR report notes R 6th rib  fx. [PS]    Clinical Course User Index [PS] Carrie Mew, MD    ----------------------------------------- 11:16 AM on 04/04/2020 -----------------------------------------  Findings discussed with hospitalist for further evaluation.  Given the facial injuries and rib fracture, and discussion with hospitalist we will hold off on starting heparin pending cardiology evaluation due to the risk of adverse bleeding.   ____________________________________________   FINAL CLINICAL IMPRESSION(S) / ED DIAGNOSES    Final diagnoses:  NSTEMI (non-ST elevated myocardial infarction) ([place])  Dementia without behavioral disturbance, unspecified dementia type ([place])  Multiple falls  Laceration of scalp, initial encounter     ED Discharge Orders    None      Portions of this note were generated with dragon dictation software. Dictation errors may occur despite best attempts at proofreading.   Carrie Mew, MD 04/04/20 1234

## 2020-04-04 NOTE — ED Triage Notes (Signed)
Pt presents to ER from home after a fall.  Per pt's wife and ems, pt was outside walking this morning, and tripped and fell.  Pt found outside lying in road by bystander and ems was called.  Pt was outside for appx 1 hr.  Pt has hx of dementia.  Pt has hematoma above right eye that is bleeding at this time.  Pt at his baseline mental status per ems.  Pt not on any blood thinners.

## 2020-04-04 NOTE — ED Notes (Signed)
In to transport pt to OR, noted to be being held down by son and provider looking at Rt eye. Opthal states to cancel OR at this time.

## 2020-04-04 NOTE — ED Notes (Signed)
Pt has pulled of urination apparatus at this time. Mitts applied to pt to prevent him from pulling on other lines.

## 2020-04-04 NOTE — Progress Notes (Signed)
*  PRELIMINARY RESULTS* Echocardiogram 2D Echocardiogram has been performed.  Travis Craig 04/04/2020, 2:22 PM

## 2020-04-05 ENCOUNTER — Inpatient Hospital Stay: Payer: Medicare Other

## 2020-04-05 ENCOUNTER — Telehealth: Payer: Self-pay

## 2020-04-05 DIAGNOSIS — T796XXA Traumatic ischemia of muscle, initial encounter: Secondary | ICD-10-CM | POA: Diagnosis not present

## 2020-04-05 DIAGNOSIS — R945 Abnormal results of liver function studies: Secondary | ICD-10-CM | POA: Diagnosis not present

## 2020-04-05 DIAGNOSIS — I10 Essential (primary) hypertension: Secondary | ICD-10-CM | POA: Diagnosis not present

## 2020-04-05 DIAGNOSIS — W19XXXA Unspecified fall, initial encounter: Secondary | ICD-10-CM | POA: Diagnosis not present

## 2020-04-05 LAB — CBC
HCT: 34.4 % — ABNORMAL LOW (ref 39.0–52.0)
Hemoglobin: 12.1 g/dL — ABNORMAL LOW (ref 13.0–17.0)
MCH: 34.8 pg — ABNORMAL HIGH (ref 26.0–34.0)
MCHC: 35.2 g/dL (ref 30.0–36.0)
MCV: 98.9 fL (ref 80.0–100.0)
Platelets: 104 10*3/uL — ABNORMAL LOW (ref 150–400)
RBC: 3.48 MIL/uL — ABNORMAL LOW (ref 4.22–5.81)
RDW: 11.7 % (ref 11.5–15.5)
WBC: 9.4 10*3/uL (ref 4.0–10.5)
nRBC: 0 % (ref 0.0–0.2)

## 2020-04-05 LAB — COMPREHENSIVE METABOLIC PANEL
ALT: 47 U/L — ABNORMAL HIGH (ref 0–44)
AST: 187 U/L — ABNORMAL HIGH (ref 15–41)
Albumin: 3.1 g/dL — ABNORMAL LOW (ref 3.5–5.0)
Alkaline Phosphatase: 43 U/L (ref 38–126)
Anion gap: 9 (ref 5–15)
BUN: 18 mg/dL (ref 8–23)
CO2: 22 mmol/L (ref 22–32)
Calcium: 8.5 mg/dL — ABNORMAL LOW (ref 8.9–10.3)
Chloride: 104 mmol/L (ref 98–111)
Creatinine, Ser: 0.79 mg/dL (ref 0.61–1.24)
GFR, Estimated: >60 mL/min (ref >60)
Glucose, Bld: 107 mg/dL — ABNORMAL HIGH (ref 70–99)
Potassium: 3.7 mmol/L (ref 3.5–5.1)
Sodium: 135 mmol/L (ref 135–145)
Total Bilirubin: 1.8 mg/dL — ABNORMAL HIGH (ref 0.3–1.2)
Total Protein: 6.1 g/dL — ABNORMAL LOW (ref 6.5–8.1)

## 2020-04-05 LAB — LIPID PANEL
Cholesterol: 107 mg/dL (ref 0–200)
HDL: 41 mg/dL (ref >40)
LDL Cholesterol: 54 mg/dL (ref 0–99)
Total CHOL/HDL Ratio: 2.6 RATIO
Triglycerides: 62 mg/dL (ref <150)
VLDL: 12 mg/dL (ref 0–40)

## 2020-04-05 LAB — TROPONIN I (HIGH SENSITIVITY): Troponin I (High Sensitivity): 213 ng/L (ref <18)

## 2020-04-05 LAB — HEMOGLOBIN A1C
Hgb A1c MFr Bld: 5.7 % — ABNORMAL HIGH (ref 4.8–5.6)
Mean Plasma Glucose: 116.89 mg/dL

## 2020-04-05 LAB — CK: Total CK: 1693 U/L — ABNORMAL HIGH (ref 49–397)

## 2020-04-05 MED ORDER — OMEGA-3-ACID ETHYL ESTERS 1 G PO CAPS
1000.0000 mg | ORAL_CAPSULE | Freq: Every day | ORAL | Status: DC
  Filled 2020-04-05 (×2): qty 1

## 2020-04-05 MED ORDER — SODIUM CHLORIDE 0.9 % IV SOLN: INTRAVENOUS | Status: AC

## 2020-04-05 MED ORDER — IPRATROPIUM BROMIDE 0.06 % NA SOLN
2.0000 | Freq: Three times a day (TID) | NASAL | Status: DC
  Administered 2020-04-05 – 2020-04-14 (×11): 2 via NASAL
  Filled 2020-04-05 (×2): qty 15

## 2020-04-05 MED ORDER — CALCIUM CARBONATE-VITAMIN D 500-200 MG-UNIT PO TABS
1.0000 | ORAL_TABLET | Freq: Every day | ORAL | Status: DC
  Administered 2020-04-05 – 2020-04-07 (×3): 1 via ORAL
  Filled 2020-04-05 (×3): qty 1

## 2020-04-05 MED ORDER — ATORVASTATIN CALCIUM 20 MG PO TABS
20.0000 mg | ORAL_TABLET | Freq: Every day | ORAL | Status: DC
  Administered 2020-04-05: 10:00:00 20 mg via ORAL
  Filled 2020-04-05: qty 1

## 2020-04-05 MED ORDER — COQ10 100 MG PO CAPS: 1.0000 | ORAL_CAPSULE | Freq: Every day | ORAL | Status: DC

## 2020-04-05 MED ORDER — SERTRALINE HCL 50 MG PO TABS
25.0000 mg | ORAL_TABLET | Freq: Every day | ORAL | Status: DC
  Filled 2020-04-05: qty 1

## 2020-04-05 MED ORDER — ASPIRIN EC 81 MG PO TBEC
81.0000 mg | DELAYED_RELEASE_TABLET | Freq: Every day | ORAL | Status: DC
  Administered 2020-04-05 – 2020-04-07 (×3): 81 mg via ORAL
  Filled 2020-04-05 (×3): qty 1

## 2020-04-05 MED ORDER — HALOPERIDOL LACTATE 5 MG/ML IJ SOLN
2.0000 mg | Freq: Four times a day (QID) | INTRAMUSCULAR | Status: AC | PRN
  Administered 2020-04-05 – 2020-04-06 (×2): 2 mg via INTRAVENOUS
  Filled 2020-04-05 (×2): qty 1

## 2020-04-05 MED ORDER — ADULT MULTIVITAMIN W/MINERALS CH
1.0000 | ORAL_TABLET | Freq: Every day | ORAL | Status: DC
  Administered 2020-04-05 – 2020-04-07 (×3): 1 via ORAL
  Filled 2020-04-05 (×3): qty 1

## 2020-04-05 MED ORDER — FLUTICASONE PROPIONATE 50 MCG/ACT NA SUSP
1.0000 | Freq: Two times a day (BID) | NASAL | Status: DC
  Administered 2020-04-05 – 2020-04-07 (×4): 1 via NASAL
  Filled 2020-04-05: qty 16

## 2020-04-05 NOTE — Progress Notes (Addendum)
PROGRESS NOTE    Travis Craig  TKW:[id number] DOB: Feb 03, 1941 DOA: 04/04/2020 PCP: Olin Hauser, DO   Brief Narrative: Travis Craig is a 79 y.o. male with medical history significant of hypertension, hyperlipidemia, hard of hearing, dementia, hyponatremia. Patient presented after a fall suffering facial fracture and found to have rhabdomyolysis and concern for infection.   Assessment & Plan:   Principal Problem:   Elevated troponin Active Problems:   Essential hypertension   Mixed hyperlipidemia   Hyponatremia   Fall at home, initial encounter   Rhabdomyolysis   Hypokalemia   Thrombocytopenia (HCC)   Diarrhea   Right rib fracture   Lactic acidosis   Fever   Orbital floor fracture (HCC)   Pterygoid plate fracture (HCC)   Abnormal LFTs   Acute metabolic encephalopathy   UTI (urinary tract infection)   Fall Significant trauma. Unsure of etiology of fall but in setting of patient's underlying cognitive impairment and wandering.  Orbital floor fracture Pterygoid plate fracture Right rib fracture Patient evaluated by ophthalmology and ENT. Recommendations for inpatient intervention. -ENT recommendations: Soft diet, avoid blowing nose -Oxycodone as needed -If LFTs improve, may benefit from scheduled Tylenol  Traumatic rhabdomyolysis Secondary to fall. CK worsening: 1056 > 1693. -Increase to NS @ 125 ml/hr for 12 hours; readdress fluids/rate in the morning -Daily CK/CMP  Fever Unknown etiology but possibly urine source. Urinalysis suggests possible infection. Urine culture obtained and is pending. Blood cultures also obtained and are pending. GI pathogen panel and C. Difficile ordered on admission but patient has not had any diarrhea since admission. Patient started empirically on Ceftriaxone on admission. -Discontinue GI pathogen panel and C. Difficile -Continue Ceftriaxone IV -Follow up blood and urine cultures  Dementia Significant  behavioral issues including agitation and frequent attempts to get up and ambulate. Wife is helpful for keeping patient calm. Trying to avoid pharmacologic treatment as much as able -Delirium precautions -Bedside sitter when able and when family not available -Haldol 2 mg IV q6 hours x 1 day; reevaluate daily  Elevated AST/ALT In setting of rhabdomyolysis. Ultrasound of RUQ ordered on admission but unable to obtain secondary to patient non-adherence. Bilirubin is slightly elevated, however. -Daily CMP and trend AST/ALT -Attempt to obtain ultrasound when patient more adherent  Elevated troponin Initially thought to be secondary to demand ischemia. Troponin still rising. Cardiology, Dr. Fletcher Anon, consulted on admission. Transthoracic Echocardiogram without wall motion abnormalities noted (limited exam) or reduced EF. -Check troponin -Cardiology recommendations  Primary hypertension -Continue aamlodipine  Hyperlipidemia -Hold statin  Thrombocytopenia Likely reactive. -Trend CBC  Hypokalemia Repleted.   Diarrhea Appears resolved.  Lactic acidosis Resolved with IV fluids.  DVT prophylaxis: SCDs Code Status:   Code Status: Full Code Family Communication: Wife at bedside Disposition Plan: Discharge home vs SNF in 2-4 days pending fever workup in addition to PT/OT recommendations   Consultants:   ENT  Ophthalmology  Neurosugery  Cardiology  Procedures:   TRANSTHORACIC ECHOCARDIOGRAM (04/04/2020) IMPRESSIONS    1. Left ventricular ejection fraction, by estimation, is 55 to 60%. The  left ventricle has normal function. Left ventricular endocardial border  not optimally defined to evaluate regional wall motion. There is mild left  ventricular hypertrophy. Left  ventricular diastolic function could not be evaluated.  2. Right ventricular systolic function is normal. The right ventricular  size is normal. Tricuspid regurgitation signal is inadequate for assessing  PA  pressure.  3. The mitral valve is normal in structure. No evidence of mitral valve  regurgitation.  No evidence of mitral stenosis.  4. The aortic valve is normal in structure. Aortic valve regurgitation is  not visualized. No aortic stenosis is present.   Antimicrobials:  Ceftriaxone IV    Subjective: Patient is altered and unable to tell me much.  Objective: Vitals:   04/04/20 2300 04/05/20 0332 04/05/20 0841 04/05/20 1155  BP: (!) 158/60 (!) 144/99 114/84 (!) 143/115  Pulse: 68 (!) 59 (!) 51 (!) 57  Resp: 20 18 18 17   Temp: 100.2 F (37.9 C) 99.3 F (37.4 C) 98.3 F (36.8 C) 98 F (36.7 C)  TempSrc: Oral Oral Oral Oral  SpO2: 94% 94% 95% 99%  Weight:  79.4 kg    Height:        Intake/Output Summary (Last 24 hours) at 04/05/2020 1435 Last data filed at 04/05/2020 0603 Gross per 24 hour  Intake 943.72 ml  Output 200 ml  Net 743.72 ml   Filed Weights   04/04/20 0621 04/05/20 0332  Weight: 83.5 kg 79.4 kg    Examination:  General exam: Appears restless HEENT: large amount of swelling with likely hematoma of right side of face. Respiratory system: Clear to auscultation. Respiratory effort normal. Cardiovascular system: S1 & S2 heard, RRR. No murmurs, rubs, gallops or clicks. Gastrointestinal system: Abdomen is nondistended, soft and nontender. No organomegaly or masses felt. Normal bowel sounds heard. Central nervous system: Alert. No focal neurological deficits. Musculoskeletal: No edema. No calf tenderness Skin: No cyanosis. No rashes    Data Reviewed: I have personally reviewed following labs and imaging studies  CBC Lab Results  Component Value Date   WBC 9.4 04/05/2020   RBC 3.48 (L) 04/05/2020   HGB 12.1 (L) 04/05/2020   HCT 34.4 (L) 04/05/2020   MCV 98.9 04/05/2020   MCH 34.8 (H) 04/05/2020   PLT 104 (L) 04/05/2020   MCHC 35.2 04/05/2020   RDW 11.7 04/05/2020   LYMPHSABS 0.3 (L) 04/04/2020   MONOABS 0.5 04/04/2020   EOSABS 0.0 04/04/2020    BASOSABS 0.0 62/22/9798     Last metabolic panel Lab Results  Component Value Date   NA 135 04/05/2020   K 3.7 04/05/2020   CL 104 04/05/2020   CO2 22 04/05/2020   BUN 18 04/05/2020   CREATININE 0.79 04/05/2020   GLUCOSE 107 (H) 04/05/2020   GFRNONAA >60 04/05/2020   GFRAA 83 01/13/2020   CALCIUM 8.5 (L) 04/05/2020   PROT 6.1 (L) 04/05/2020   ALBUMIN 3.1 (L) 04/05/2020   BILITOT 1.8 (H) 04/05/2020   ALKPHOS 43 04/05/2020   AST 187 (H) 04/05/2020   ALT 47 (H) 04/05/2020   ANIONGAP 9 04/05/2020    CBG (last 3)  No results for input(s): GLUCAP in the last 72 hours.   GFR: Estimated Creatinine Clearance: 78.4 mL/min (by C-G formula based on SCr of 0.79 mg/dL).  Coagulation Profile: Recent Labs  Lab 04/04/20 1200  INR 1.1    Recent Results (from the past 240 hour(s))  Resp Panel by RT-PCR (Flu A&B, Covid) Nasopharyngeal Swab     Status: None   Collection Time: 04/02/20  6:06 AM   Specimen: Nasopharyngeal Swab; Nasopharyngeal(NP) swabs in vial transport medium  Result Value Ref Range Status   SARS Coronavirus 2 by RT PCR NEGATIVE NEGATIVE Final    Comment: (NOTE) SARS-CoV-2 target nucleic acids are NOT DETECTED.  The SARS-CoV-2 RNA is generally detectable in upper respiratory specimens during the acute phase of infection. The lowest concentration of SARS-CoV-2 viral copies this assay can detect  is 138 copies/mL. A negative result does not preclude SARS-Cov-2 infection and should not be used as the sole basis for treatment or other patient management decisions. A negative result may occur with  improper specimen collection/handling, submission of specimen other than nasopharyngeal swab, presence of viral mutation(s) within the areas targeted by this assay, and inadequate number of viral copies(<138 copies/mL). A negative result must be combined with clinical observations, patient history, and epidemiological information. The expected result is Negative.  Fact  Sheet for Patients:  EntrepreneurPulse.com.au  Fact Sheet for Healthcare Providers:  IncredibleEmployment.be  This test is no t yet approved or cleared by the [place] FDA and  has been authorized for detection and/or diagnosis of SARS-CoV-2 by FDA under an Emergency Use Authorization (EUA). This EUA will remain  in effect (meaning this test can be used) for the duration of the COVID-19 declaration under Section 564(b)(1) of the Act, 21 U.S.C.section 360bbb-3(b)(1), unless the authorization is terminated  or revoked sooner.       Influenza A by PCR NEGATIVE NEGATIVE Final   Influenza B by PCR NEGATIVE NEGATIVE Final    Comment: (NOTE) The Xpert Xpress SARS-CoV-2/FLU/RSV plus assay is intended as an aid in the diagnosis of influenza from Nasopharyngeal swab specimens and should not be used as a sole basis for treatment. Nasal washings and aspirates are unacceptable for Xpert Xpress SARS-CoV-2/FLU/RSV testing.  Fact Sheet for Patients: EntrepreneurPulse.com.au  Fact Sheet for Healthcare Providers: IncredibleEmployment.be  This test is not yet approved or cleared by the [place] FDA and has been authorized for detection and/or diagnosis of SARS-CoV-2 by FDA under an Emergency Use Authorization (EUA). This EUA will remain in effect (meaning this test can be used) for the duration of the COVID-19 declaration under Section 564(b)(1) of the Act, 21 U.S.C. section 360bbb-3(b)(1), unless the authorization is terminated or revoked.  Performed at [facility], [address] [place], [place] [address]   Culture, blood (routine x 2)     Status: None (Preliminary result)   Collection Time: 04/04/20  6:49 AM   Specimen: BLOOD  Result Value Ref Range Status   Specimen Description BLOOD LEFT [facility]  Final   Special Requests   Final    BOTTLES DRAWN AEROBIC AND ANAEROBIC Blood Culture adequate volume    Culture   Final    NO GROWTH < 24 HOURS Performed at [facility], [address], [place], [place] [address]    Report Status PENDING  Incomplete  Culture, blood (routine x 2)     Status: None (Preliminary result)   Collection Time: 04/04/20  6:49 AM   Specimen: BLOOD  Result Value Ref Range Status   Specimen Description BLOOD RIGHT [facility]  Final   Special Requests   Final    BOTTLES DRAWN AEROBIC AND ANAEROBIC Blood Culture adequate volume   Culture   Final    NO GROWTH < 24 HOURS Performed at [facility], [address], [place], [place] [address]    Report Status PENDING  Incomplete  Resp Panel by RT-PCR (Flu A&B, Covid) Nasopharyngeal Swab     Status: None   Collection Time: 04/04/20  6:49 AM   Specimen: Nasopharyngeal Swab; Nasopharyngeal(NP) swabs in vial transport medium  Result Value Ref Range Status   SARS Coronavirus 2 by RT PCR NEGATIVE NEGATIVE Final    Comment: (NOTE) SARS-CoV-2 target nucleic acids are NOT DETECTED.  The SARS-CoV-2 RNA is generally detectable in upper respiratory specimens during the acute  phase of infection. The lowest concentration of SARS-CoV-2 viral copies this assay can detect is 138 copies/mL. A negative result does not preclude SARS-Cov-2 infection and should not be used as the sole basis for treatment or other patient management decisions. A negative result may occur with  improper specimen collection/handling, submission of specimen other than nasopharyngeal swab, presence of viral mutation(s) within the areas targeted by this assay, and inadequate number of viral copies(<138 copies/mL). A negative result must be combined with clinical observations, patient history, and epidemiological information. The expected result is Negative.  Fact Sheet for Patients:  EntrepreneurPulse.com.au  Fact Sheet for Healthcare Providers:  IncredibleEmployment.be  This test is no t yet approved or  cleared by the [place] FDA and  has been authorized for detection and/or diagnosis of SARS-CoV-2 by FDA under an Emergency Use Authorization (EUA). This EUA will remain  in effect (meaning this test can be used) for the duration of the COVID-19 declaration under Section 564(b)(1) of the Act, 21 U.S.C.section 360bbb-3(b)(1), unless the authorization is terminated  or revoked sooner.       Influenza A by PCR NEGATIVE NEGATIVE Final   Influenza B by PCR NEGATIVE NEGATIVE Final    Comment: (NOTE) The Xpert Xpress SARS-CoV-2/FLU/RSV plus assay is intended as an aid in the diagnosis of influenza from Nasopharyngeal swab specimens and should not be used as a sole basis for treatment. Nasal washings and aspirates are unacceptable for Xpert Xpress SARS-CoV-2/FLU/RSV testing.  Fact Sheet for Patients: EntrepreneurPulse.com.au  Fact Sheet for Healthcare Providers: IncredibleEmployment.be  This test is not yet approved or cleared by the [place] FDA and has been authorized for detection and/or diagnosis of SARS-CoV-2 by FDA under an Emergency Use Authorization (EUA). This EUA will remain in effect (meaning this test can be used) for the duration of the COVID-19 declaration under Section 564(b)(1) of the Act, 21 U.S.C. section 360bbb-3(b)(1), unless the authorization is terminated or revoked.  Performed at [facility], [address], [place], [place] [address]         Radiology Studies: DG Chest 1 View  Result Date: 04/04/2020 CLINICAL DATA:  Fall.  Pain. EXAM: CHEST  1 VIEW COMPARISON:  Chest x-ray 09/05/2017. FINDINGS: Calcified mediastinum hilar lymph nodes consistent prior granulomas disease again noted. Cardiomegaly. No pulmonary venous congestion. Persistent stable small density noted the right mid lung consistent with scarring. Persistent right mid pleuroparenchymal thickening consistent scarring. No acute infiltrate. No  pleural effusion or pneumothorax. Prominent loops of bowel, most likely colon, again noted in the upper abdomen. Surgical clips upper abdomen. Slightly displaced right anterior 6 rib fracture noted. IMPRESSION: 1. Stable small density right mid lung consistent with scarring. Right mid lung pleural-parenchymal thickening consistent with scarring. Evidence of prior granulomas disease. No acute pulmonary disease noted. 2.  Cardiomegaly.  No pulmonary venous congestion. 3.  Displaced right anterior 6 rib fracture noted.  No pneumothorax. Electronically Signed   By: Marcello Moores  Register   On: 04/04/2020 07:37   DG Pelvis 1-2 Views  Result Date: 04/04/2020 CLINICAL DATA:  Fall.  Pain. EXAM: PELVIS - 1-2 VIEW COMPARISON:  No prior. FINDINGS: Degenerative changes lumbar spine and both hips. No acute bony abnormality. No evidence of fracture dislocation. Aortoiliac atherosclerotic vascular calcification. Pelvic calcifications consistent phleboliths. IMPRESSION: Degenerative changes lumbar spine and both hips. No acute abnormality. Electronically Signed   By: Marcello Moores  Register   On: 04/04/2020 07:25   CT Head Wo Contrast  Result Date: 04/04/2020 CLINICAL DATA:  Fall  with facial trauma EXAM: CT HEAD WITHOUT CONTRAST CT MAXILLOFACIAL WITHOUT CONTRAST CT CERVICAL SPINE WITHOUT CONTRAST TECHNIQUE: Multidetector CT imaging of the head, cervical spine, and maxillofacial structures were performed using the standard protocol without intravenous contrast. Multiplanar CT image reconstructions of the cervical spine and maxillofacial structures were also generated. COMPARISON:  Head CT 04/02/2020 FINDINGS: CT HEAD FINDINGS Brain: No evidence of acute infarction, hemorrhage, hydrocephalus, extra-axial collection or mass lesion/mass effect. Age normal appearance of the brain. Vascular: No hyperdense vessel or unexpected calcification. Skull: Large right forehead hematoma.  No calvarial fracture. CT MAXILLOFACIAL FINDINGS Osseous:  Minimally depressed right orbital floor fracture. Nondisplaced fractures of the right medial and lateral pterygoid plates with probable continuation along the wall of the right nasal cavity, but no complete LeFort injury. The mandible is intact and located. Large periapical erosion around tooth 3, dehiscent into the sinus. Orbits: Mild extraconal hemorrhage along the orbital floor fracture on the right. Bilateral cataract resection. Sinuses: Low-density/inflammatory appearing mucosal thickening at the right more than left maxillary sinus. Soft tissues: Large hematoma above the right orbit. CT CERVICAL SPINE FINDINGS Alignment: No traumatic malalignment Skull base and vertebrae: No acute fracture Soft tissues and spinal canal: No prevertebral fluid or swelling. No visible canal hematoma. Disc levels:  Ordinary degenerative changes Upper chest: No evidence of injury IMPRESSION: 1. No evidence of intracranial or cervical spine injury. 2. Minimally depressed right orbital floor fracture. 3. Nondisplaced right pterygoid plate fractures. No complete LeFort injury. Electronically Signed   By: Monte Fantasia M.D.   On: 04/04/2020 07:20   CT Cervical Spine Wo Contrast  Result Date: 04/04/2020 CLINICAL DATA:  Fall with facial trauma EXAM: CT HEAD WITHOUT CONTRAST CT MAXILLOFACIAL WITHOUT CONTRAST CT CERVICAL SPINE WITHOUT CONTRAST TECHNIQUE: Multidetector CT imaging of the head, cervical spine, and maxillofacial structures were performed using the standard protocol without intravenous contrast. Multiplanar CT image reconstructions of the cervical spine and maxillofacial structures were also generated. COMPARISON:  Head CT 04/02/2020 FINDINGS: CT HEAD FINDINGS Brain: No evidence of acute infarction, hemorrhage, hydrocephalus, extra-axial collection or mass lesion/mass effect. Age normal appearance of the brain. Vascular: No hyperdense vessel or unexpected calcification. Skull: Large right forehead hematoma.  No calvarial  fracture. CT MAXILLOFACIAL FINDINGS Osseous: Minimally depressed right orbital floor fracture. Nondisplaced fractures of the right medial and lateral pterygoid plates with probable continuation along the wall of the right nasal cavity, but no complete LeFort injury. The mandible is intact and located. Large periapical erosion around tooth 3, dehiscent into the sinus. Orbits: Mild extraconal hemorrhage along the orbital floor fracture on the right. Bilateral cataract resection. Sinuses: Low-density/inflammatory appearing mucosal thickening at the right more than left maxillary sinus. Soft tissues: Large hematoma above the right orbit. CT CERVICAL SPINE FINDINGS Alignment: No traumatic malalignment Skull base and vertebrae: No acute fracture Soft tissues and spinal canal: No prevertebral fluid or swelling. No visible canal hematoma. Disc levels:  Ordinary degenerative changes Upper chest: No evidence of injury IMPRESSION: 1. No evidence of intracranial or cervical spine injury. 2. Minimally depressed right orbital floor fracture. 3. Nondisplaced right pterygoid plate fractures. No complete LeFort injury. Electronically Signed   By: Monte Fantasia M.D.   On: 04/04/2020 07:20   DG Abd Portable 1V  Result Date: 04/05/2020 CLINICAL DATA:  Diarrhea EXAM: PORTABLE ABDOMEN - 1 VIEW COMPARISON:  None. FINDINGS: There is moderate stool in the colon. There is air throughout much the bowel without bowel dilatation. No air-fluid levels. No free air evident on  supine imaging. Lung bases are clear. There are foci of arterial vascular calcification in the pelvis. There are surgical clips near the gastroesophageal junction. IMPRESSION: Moderate stool in the colon. No bowel obstruction or free air evident on supine examination. Lung bases clear. Electronically Signed   By: Lowella Grip III M.D.   On: 04/05/2020 11:25   ECHOCARDIOGRAM COMPLETE  Result Date: 04/04/2020    ECHOCARDIOGRAM REPORT   Patient Name:   EMIGDIO WILDEMAN Date of Exam: 04/04/2020 Medical Rec #:  [id number]          Height:       71.0 in Accession #:    [id number]         Weight:       184.1 lb Date of Birth:  06/21/1940          BSA:          2.036 m Patient Age:    66 years           BP:           170/92 mmHg Patient Gender: M                  HR:           65 bpm. Exam Location:  [facility] Procedure: 2D Echo, Color Doppler and Cardiac Doppler Indications:     Elevated Troponin  History:         Patient has no prior history of Echocardiogram examinations.                  Risk Factors:Hypertension and Dyslipidemia.  Sonographer:     Sherrie Sport RDCS (AE) Referring Phys:  [id number] Arvil Chaco Diagnosing Phys: Kathlyn Sacramento MD  Sonographer Comments: Technically difficult study due to poor echo windows and no apical window. Image acquisition challenging due to respiratory motion. IMPRESSIONS  1. Left ventricular ejection fraction, by estimation, is 55 to 60%. The left ventricle has normal function. Left ventricular endocardial border not optimally defined to evaluate regional wall motion. There is mild left ventricular hypertrophy. Left ventricular diastolic function could not be evaluated.  2. Right ventricular systolic function is normal. The right ventricular size is normal. Tricuspid regurgitation signal is inadequate for assessing PA pressure.  3. The mitral valve is normal in structure. No evidence of mitral valve regurgitation. No evidence of mitral stenosis.  4. The aortic valve is normal in structure. Aortic valve regurgitation is not visualized. No aortic stenosis is present. FINDINGS  Left Ventricle: Left ventricular ejection fraction, by estimation, is 55 to 60%. The left ventricle has normal function. Left ventricular endocardial border not optimally defined to evaluate regional wall motion. The left ventricular internal cavity size was normal in size. There is mild left ventricular hypertrophy. Left ventricular diastolic function could not be  evaluated. Right Ventricle: The right ventricular size is normal. No increase in right ventricular wall thickness. Right ventricular systolic function is normal. Tricuspid regurgitation signal is inadequate for assessing PA pressure. Left Atrium: Left atrial size was normal in size. Right Atrium: Right atrial size was normal in size. Pericardium: There is no evidence of pericardial effusion. Mitral Valve: The mitral valve is normal in structure. No evidence of mitral valve regurgitation. No evidence of mitral valve stenosis. Tricuspid Valve: The tricuspid valve is normal in structure. Tricuspid valve regurgitation is not demonstrated. No evidence of tricuspid stenosis. Aortic Valve: The aortic valve is normal in structure. Aortic valve regurgitation is not visualized. No aortic  stenosis is present. Pulmonic Valve: The pulmonic valve was normal in structure. Pulmonic valve regurgitation is not visualized. No evidence of pulmonic stenosis. Aorta: The aortic root is normal in size and structure. Venous: The inferior vena cava was not well visualized. IAS/Shunts: No atrial level shunt detected by color flow Doppler.  LEFT VENTRICLE PLAX 2D LVIDd:         4.59 cm LVIDs:         3.30 cm LV PW:         1.14 cm LV IVS:        1.16 cm LVOT diam:     2.00 cm LVOT Area:     3.14 cm  LEFT ATRIUM         Index LA diam:    4.40 cm 2.16 cm/m                        PULMONIC VALVE AORTA                 PV Vmax:        0.84 m/s Ao Root diam: 3.20 cm PV Peak grad:   2.8 mmHg                       RVOT Peak grad: 4 mmHg  TRICUSPID VALVE TR Peak grad:   17.0 mmHg TR Vmax:        206.00 cm/s  SHUNTS Systemic Diam: 2.00 cm Kathlyn Sacramento MD Electronically signed by Kathlyn Sacramento MD Signature Date/Time: 04/04/2020/3:15:12 PM    Final    CT Maxillofacial WO CM  Result Date: 04/04/2020 CLINICAL DATA:  Fall with facial trauma EXAM: CT HEAD WITHOUT CONTRAST CT MAXILLOFACIAL WITHOUT CONTRAST CT CERVICAL SPINE WITHOUT CONTRAST TECHNIQUE:  Multidetector CT imaging of the head, cervical spine, and maxillofacial structures were performed using the standard protocol without intravenous contrast. Multiplanar CT image reconstructions of the cervical spine and maxillofacial structures were also generated. COMPARISON:  Head CT 04/02/2020 FINDINGS: CT HEAD FINDINGS Brain: No evidence of acute infarction, hemorrhage, hydrocephalus, extra-axial collection or mass lesion/mass effect. Age normal appearance of the brain. Vascular: No hyperdense vessel or unexpected calcification. Skull: Large right forehead hematoma.  No calvarial fracture. CT MAXILLOFACIAL FINDINGS Osseous: Minimally depressed right orbital floor fracture. Nondisplaced fractures of the right medial and lateral pterygoid plates with probable continuation along the wall of the right nasal cavity, but no complete LeFort injury. The mandible is intact and located. Large periapical erosion around tooth 3, dehiscent into the sinus. Orbits: Mild extraconal hemorrhage along the orbital floor fracture on the right. Bilateral cataract resection. Sinuses: Low-density/inflammatory appearing mucosal thickening at the right more than left maxillary sinus. Soft tissues: Large hematoma above the right orbit. CT CERVICAL SPINE FINDINGS Alignment: No traumatic malalignment Skull base and vertebrae: No acute fracture Soft tissues and spinal canal: No prevertebral fluid or swelling. No visible canal hematoma. Disc levels:  Ordinary degenerative changes Upper chest: No evidence of injury IMPRESSION: 1. No evidence of intracranial or cervical spine injury. 2. Minimally depressed right orbital floor fracture. 3. Nondisplaced right pterygoid plate fractures. No complete Le[place] injury. Electronically Signed   By: Monte Fantasia M.D.   On: 04/04/2020 07:20        Scheduled Meds: . amLODipine  10 mg Oral Daily  . aspirin EC  81 mg Oral Daily  . atorvastatin  20 mg Oral Daily  . calcium-vitamin D  1 tablet Oral  Daily  . fluticasone  1 spray Each Nare BID  . ipratropium  2 spray Each Nare TID  . multivitamin with minerals  1 tablet Oral Daily  . omega-3 acid ethyl esters  1,000 mg Oral Daily  . sertraline  25 mg Oral Daily   Continuous Infusions: . sodium chloride 75 mL/hr at 04/05/20 1238  . cefTRIAXone (ROCEPHIN)  IV Stopped (04/04/20 1748)     LOS: 1 day     Cordelia Poche, MD [facility] 04/05/2020, 2:35 PM  If 7PM-7AM, please contact night-coverage [email or web address]

## 2020-04-05 NOTE — Telephone Encounter (Signed)
Patient's spouse advised.  

## 2020-04-05 NOTE — Telephone Encounter (Signed)
Last visit 03/25/20 for similar discussion.  My understanding is that the patient is still in the hospital currently. This is a complicated case and he will need to proceed with his hospitalization first before I can really help and provide recommendations.  As a result of the hospitalization, they may recommend discharge eventually to skilled nursing facility for recovery, I am not sure at this time - but this is something that may be a recommendation before he is eventually discharged.  His doctors in the hospital should be able to help determine this.  Also - at our last visit 03/25/20 I offered a referral to our Chronic Care Management Team - (nurse, social worker, pharmacist etc). I would recommend that as our next step to get them involved to better help answer some of her questions with regards to home care advice and can help connect her to any available resources if needed.  Let me know if she is ready for me to proceed with this referral - they will schedule time to call her when ready. Note - holiday may delay this.  Nobie Putnam, DO [facility] 04/05/2020, 11:27 AM

## 2020-04-05 NOTE — Progress Notes (Signed)
Pt troponin is 213. Dr. Lonny Prude notified via secure chat.

## 2020-04-05 NOTE — Progress Notes (Signed)
Placed pt in a low bed and floor mates beside of bed. Bed alarm on. Call bell within reach.

## 2020-04-05 NOTE — Consult Note (Signed)
Elise, Gladden [id number] 1940-10-18 Mariel Aloe, MD  Reason for Consult: Evaluate facial fractures  HPI: The patient is an 79 year old white male who was at home and having diarrhea he got weaker and had a significant fall.  He hit his right head face with evidence of bilateral orbital ecchymosis and a significant abrasion/laceration right upper brow and forehead area.  He has some dementia and but generally is oriented most of the time at home.  He apparently got up early and left the house wanted to go to work but he is not currently employed.  He fell outside was found lying in the road by a bystander.  He has multiple medical challenges and was admitted to the hospital last night.  A CT scan was done of his head and facial bones which was reviewed by me last night.  Patient is evaluated this morning.  He is currently sedated and not responding well.  He is not answering any questions.  Allergies: No Known Allergies  ROS: Review of systems cannot be obtained because of his current mental status  PMH:  Past Medical History:  Diagnosis Date  . Chicken pox   . Hearing loss   . Hyperlipidemia   . Hypertension   . Ulcer     FH:  Family History  Problem Relation Age of Onset  . Hypertension Father   . Alzheimer's disease Father     SH:  Social History   Socioeconomic History  . Marital status: Married    Spouse name: Not on file  . Number of children: 2  . Years of education: 76  . Highest education level: Not on file  Occupational History  . Occupation: Retired  Tobacco Use  . Smoking status: Never Smoker  . Smokeless tobacco: Never Used  Vaping Use  . Vaping Use: Never used  Substance and Sexual Activity  . Alcohol use: Yes    Alcohol/week: 2.0 standard drinks    Types: 2 Glasses of wine per week  . Drug use: No  . Sexual activity: Not Currently    Partners: Female  Other Topics Concern  . 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 BLEPHAROPLASTY    . CHOLECYSTECTOMY    . Ulcer surgery      Physical  Exam: The patient is lying in bed with his eyes closed.  He will rouse and moan a little bit but is sedated and does not answer any questions.  He has bilateral periorbital ecchymosis.  He does not open up his eyes for me.  He has an abrasion of the skin above his right eyebrow that is covered with a bandage currently.  I can see a portion of it up underneath the bandage and have left it bandaged up.  He has some swelling over his right malar eminence and the rest of his face does not appear to be  injured.  His nose is open anteriorly.  He will not open up his mouth for me.  He is breathing easily through his nose.  His neck is thin and negative for any bruising or any nodes or masses.  I reviewed the CT scan in detail which shows fractures involving the pterygoid plates and the lateral wall maxillary sinus.  These are either nondisplaced or minimally displaced.  He has a right orbital floor fracture that is nondisplaced.  He has evidence for fluid in the right maxillary sinus that is likely blood.  The rest of his sinuses appear to be relatively open and clear.   A/P: He has several fractures around his orbit and maxillary sinus/pterygoid plates on the right side.  These are nondisplaced or minimally displaced and do not require any kind of surgical intervention.  These bones will heal where they sit.  These will not cause any change in his  appearance nor function.  He does have what looks like some old blood in his right maxillary sinus and many times an antibiotic is used prophylactically for a week or so to make sure that infection does not start in the sinus.  The blood will liquefy and tend to drain out on its own over the next few weeks.  He should not blow his nose for the time being.  He should be on relatively soft foods so he is not chewing hard things on the right side of his mouth that may hurt and cause problems as the bones are healing.  He does not need any specific ENT follow-up, unless there are any challenges that show up during his recovery period.  The bruising will likely take about 3 weeks to slowly disappear.   Elon Alas Vaeda Westall 04/05/2020 7:29 AM

## 2020-04-05 NOTE — Telephone Encounter (Signed)
Copied from [place] [id number]. Topic: General - Other >> Apr 04, 2020  4:03 PM Celene Kras wrote: Reason for CRM: Pts wife called and is requesting to have PCP call her back. She states that the pt has been admitted into the hospital due to a bad fall. She is requesting to have some advice on what she should do as far as the best way to care for pt. Please advise.

## 2020-04-05 NOTE — Progress Notes (Signed)
PHARMACIST - PHYSICIAN ORDER COMMUNICATION  CONCERNING: P&T Medication Policy on Herbal Medications  DESCRIPTION:  This patient's order for:  Co-Q-10  has been noted.  This product(s) is classified as an "herbal" or natural product. Due to a lack of definitive safety studies or FDA approval, nonstandard manufacturing practices, plus the potential risk of unknown drug-drug interactions while on inpatient medications, the Pharmacy and Therapeutics Committee does not permit the use of "herbal" or natural products of this type within [facility].   ACTION TAKEN: The pharmacy department is unable to verify this order at this time. Please reevaluate patient's clinical condition at discharge and address if the herbal or natural product(s) should be resumed at that time.   

## 2020-04-06 ENCOUNTER — Inpatient Hospital Stay: Payer: Medicare Other

## 2020-04-06 DIAGNOSIS — G9341 Metabolic encephalopathy: Secondary | ICD-10-CM

## 2020-04-06 DIAGNOSIS — N3001 Acute cystitis with hematuria: Secondary | ICD-10-CM | POA: Diagnosis not present

## 2020-04-06 DIAGNOSIS — I10 Essential (primary) hypertension: Secondary | ICD-10-CM | POA: Diagnosis not present

## 2020-04-06 LAB — CBC
HCT: 33 % — ABNORMAL LOW (ref 39.0–52.0)
Hemoglobin: 12 g/dL — ABNORMAL LOW (ref 13.0–17.0)
MCH: 35.5 pg — ABNORMAL HIGH (ref 26.0–34.0)
MCHC: 36.4 g/dL — ABNORMAL HIGH (ref 30.0–36.0)
MCV: 97.6 fL (ref 80.0–100.0)
Platelets: 124 10*3/uL — ABNORMAL LOW (ref 150–400)
RBC: 3.38 MIL/uL — ABNORMAL LOW (ref 4.22–5.81)
RDW: 11.8 % (ref 11.5–15.5)
WBC: 10.7 10*3/uL — ABNORMAL HIGH (ref 4.0–10.5)
nRBC: 0 % (ref 0.0–0.2)

## 2020-04-06 LAB — COMPREHENSIVE METABOLIC PANEL
ALT: 58 U/L — ABNORMAL HIGH (ref 0–44)
AST: 186 U/L — ABNORMAL HIGH (ref 15–41)
Albumin: 2.9 g/dL — ABNORMAL LOW (ref 3.5–5.0)
Alkaline Phosphatase: 49 U/L (ref 38–126)
Anion gap: 7 (ref 5–15)
BUN: 18 mg/dL (ref 8–23)
CO2: 26 mmol/L (ref 22–32)
Calcium: 8.2 mg/dL — ABNORMAL LOW (ref 8.9–10.3)
Chloride: 108 mmol/L (ref 98–111)
Creatinine, Ser: 0.6 mg/dL — ABNORMAL LOW (ref 0.61–1.24)
GFR, Estimated: >60 mL/min (ref >60)
Glucose, Bld: 126 mg/dL — ABNORMAL HIGH (ref 70–99)
Potassium: 3.1 mmol/L — ABNORMAL LOW (ref 3.5–5.1)
Sodium: 141 mmol/L (ref 135–145)
Total Bilirubin: 1.9 mg/dL — ABNORMAL HIGH (ref 0.3–1.2)
Total Protein: 5.9 g/dL — ABNORMAL LOW (ref 6.5–8.1)

## 2020-04-06 LAB — CK: Total CK: 1001 U/L — ABNORMAL HIGH (ref 49–397)

## 2020-04-06 MED ORDER — HALOPERIDOL LACTATE 5 MG/ML IJ SOLN
5.0000 mg | Freq: Once | INTRAMUSCULAR | Status: AC
  Administered 2020-04-06: 22:00:00 5 mg via INTRAVENOUS
  Filled 2020-04-06: qty 1

## 2020-04-06 MED ORDER — POTASSIUM CHLORIDE 10 MEQ/100ML IV SOLN
10.0000 meq | INTRAVENOUS | Status: AC
  Administered 2020-04-06 – 2020-04-07 (×5): 10 meq via INTRAVENOUS
  Filled 2020-04-06 (×6): qty 100

## 2020-04-06 MED ORDER — LORAZEPAM 2 MG/ML IJ SOLN
INTRAMUSCULAR | Status: AC
  Filled 2020-04-06: qty 1

## 2020-04-06 MED ORDER — LORAZEPAM 2 MG/ML IJ SOLN
1.0000 mg | Freq: Once | INTRAMUSCULAR | Status: AC
  Administered 2020-04-06: 01:00:00 1 mg via INTRAVENOUS

## 2020-04-06 MED ORDER — SODIUM CHLORIDE 0.9 % IV SOLN: INTRAVENOUS | Status: DC

## 2020-04-06 NOTE — Progress Notes (Signed)
Pt has been confused and combative.  Attempted Haldol IV but IV has infiltrated.  NP, Ouma at bedside.  IV team consulted.  Sitter at bs.

## 2020-04-06 NOTE — Progress Notes (Signed)
PROGRESS NOTE    Travis Craig   XBM:[id number]  DOB: Sep 07, 1940  PCP: Smitty Cords, DO    DOA: 04/04/2020 LOS: 2   Brief Narrative   Travis Craig is a 79 y.o. male with medical history significant of hypertension, hyperlipidemia, hard of hearing, dementia, hyponatremia. Patient presented after a fall suffering facial and right-sided rib fractures and found to have rhabdomyolysis and UTI.  Patient's dementia is fairly mild at baseline, per wife's report, but is having some behavioral disturbances with agitation requiring sitter and/or wife to be at bedside for patient's safety.  He was seen by ophthalmology and ENT with no surgical intervention needed for facial fractures.  Urine culture growing E. Coli.  Patient on empiric Rocephin pending susceptibilities.  On IV fluids for rhabdomyolysis.     Assessment & Plan   Principal Problem:   Elevated troponin Active Problems:   Essential hypertension   Mixed hyperlipidemia   Hyponatremia   Fall at home, initial encounter   Rhabdomyolysis   Hypokalemia   Thrombocytopenia (HCC)   Diarrhea   Right rib fracture   Lactic acidosis   Fever   Orbital floor fracture (HCC)   Pterygoid plate fracture (HCC)   Abnormal LFTs   Acute metabolic encephalopathy   UTI (urinary tract infection)   Fall -with significant trauma and multiple facial and rib fractures as outlined below.  Fall was likely due to underlying infection.    Orbital floor fracture Pterygoid plate fracture Right rib fracture Patient evaluated by ophthalmology and ENT.  No need for surgical intervention. -ENT recommendations: Soft diet, avoid blowing nose -Oxycodone as needed -Consider scheduled Tylenol if LFTs improving  Traumatic rhabdomyolysis -secondary to fall.  CK is trending downward with IV fluids. Continue normal saline at 100 cc/h.  Monitor CMP and CK daily.  Urinary tract infection -present on admission.  Urine culture growing  E. coli with susceptibilities pending.  On empiric Rocephin, continue.  Follow-up blood and urine cultures.  On IV fluids for rhabdo.    Fever secondary to UTI -resolved.  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 Patient seems to do better with wife present at bedside.   --Continue one-to-one sitter for safety, especially when wife not present ----Delirium precautions:     -Lights and TV off, minimize interruptions at night    -Blinds open and lights on during day    -Glasses/hearing aid with patient    -Frequent reorientation    -PT/OT when able    -Avoid sedation medications/Beers list medications --Haldol as needed  Elevated AST/ALT - in setting of rhabdo.  Right upper quadrant ultrasound unremarkable, showed hepatic steatosis, no focal hepatic lesions and postcholecystectomy sequelae.  Elevated troponin - attributed to demand ischemia in setting of fall, infection.  Cardiology was consulted. EKG and Echo were unremarkable.  Due to dementia, not good candidate for ischemic evaluation.  Cardiology has signed off.  Patient is free of chest pain.  Essential Hpertension - on amlodipine  Hyperlipidemia - statin on hold for now.  Thrombocytopenia - suspect reactive.  Monitor CBC.  Hypokalemia - replaced, monitor BMP and replace as needed  Diarrhea - resolved.  Lactic acidosis - resolved with IV hydration   DVT prophylaxis: SCDs Start: 04/05/20 0101   Diet:  Diet Orders (From admission, onward)    Start     Ordered   04/05/20 1038  DIET SOFT Room service appropriate? Yes; Fluid consistency: Thin  Diet effective now  Question Answer Comment  Room service appropriate? Yes   Fluid consistency: Thin      04/05/20 1037            Code Status: Full Code    Subjective 04/06/20    Patient seen with wife and sitter at bedside today.  Patient currently is fairly calm.  Has mittens on both hands  and occasionally rubs and grabs at the dressing on his head.  Per wife, patient has not required any medication for pain nor appeared to be in any discomfort.  Passive dressing on his head can be changed or removed today.  No other acute events reported.   Disposition Plan & Communication   Status is: Inpatient  Remains inpatient appropriate because:IV treatments appropriate due to intensity of illness or inability to take PO.  On empiric IV Rocephin pending urine culture susceptibility.   Dispo: The patient is from: Home              Anticipated d/c is to: Home              Anticipated d/c date is: 2 days              Patient currently is not medically stable to d/c.   Family Communication: Wife is at bedside on rounds today   Consults, Procedures, Significant Events   Consultants:   Ophthalmology  ENT  Cardiology  Neurosurgery  Procedures:   Laceration repair  Antimicrobials:  Anti-infectives (From admission, onward)   Start     Dose/Rate Route Frequency Ordered Stop   04/04/20 1800  cefTRIAXone (ROCEPHIN) 1 g in sodium chloride 0.9 % 100 mL IVPB        1 g 200 mL/hr over 30 Minutes Intravenous Every 24 hours 04/04/20 1652          Objective   Vitals:   04/06/20 0052 04/06/20 0337 04/06/20 0833 04/06/20 1131  BP: (!) 153/88 (!) 158/119 (!) 167/86 (!) 184/94  Pulse: 81 100 67 63  Resp:    20  Temp:  97.6 F (36.4 C) 98.3 F (36.8 C) 98 F (36.7 C)  TempSrc:  Oral Oral Axillary  SpO2:   99% 92%  Weight:      Height:        Intake/Output Summary (Last 24 hours) at 04/06/2020 1517 Last data filed at 04/06/2020 1020 Gross per 24 hour  Intake 2212.6 ml  Output 225 ml  Net 1987.6 ml   Filed Weights   04/04/20 0621 04/05/20 0332  Weight: 83.5 kg 79.4 kg    Physical Exam:  General exam: awake, alert, no acute distress HEENT: Bilateral periorbital ecchymosis, gauze wrap around forehead intact Respiratory system: CTAB, no wheezes, rales or rhonchi,  normal respiratory effort. Cardiovascular system: normal S1/S2, RRR, no pedal edema.   Gastrointestinal system: soft, NT, ND, no HSM felt, +bowel sounds. Central nervous system: Unable to fully evaluate as patient does not follow commands and has dressing covering both eyes.  Moves all limbs.  Minimal speech during encounter seem normal.   Labs   Data Reviewed: I have personally reviewed following labs and imaging studies  CBC: Recent Labs  Lab 04/02/20 0548 04/04/20 0649 04/05/20 0454 04/06/20 0501  WBC 7.0 7.7 9.4 10.7*  NEUTROABS 6.2 6.8  --   --   HGB 13.6 12.5* 12.1* 12.0*  HCT 39.4 34.4* 34.4* 33.0*  MCV 102.3* 97.5 98.9 97.6  PLT 126* 106* 104* 124*   Basic Metabolic Panel: Recent Labs  Lab 04/04/20 0649 04/04/20 0955 04/04/20 1200 04/04/20 2034 04/05/20 0454 04/06/20 0501  NA 128*  --  130* 135 135 141  K 3.3*  --  3.3* 3.4* 3.7 3.1*  CL 97*  --  99 102 104 108  CO2 21*  --  22 23 22 26   GLUCOSE 109*  --  113* 113* 107* 126*  BUN 23  --  21 18 18 18   CREATININE 1.07  --  0.99 0.84 0.79 0.60*  CALCIUM 8.6*  --  8.4* 8.7* 8.5* 8.2*  MG  --  1.8  --   --   --   --    GFR: Estimated Creatinine Clearance: 78.4 mL/min (A) (by C-G formula based on SCr of 0.6 mg/dL (L)). Liver Function Tests: Recent Labs  Lab 04/02/20 0548 04/04/20 0649 04/05/20 0454 04/06/20 0501  AST 31 159* 187* 186*  ALT 23 52* 47* 58*  ALKPHOS 59 48 43 49  BILITOT 1.6* 1.5* 1.8* 1.9*  PROT 7.1 6.5 6.1* 5.9*  ALBUMIN 3.8 3.4* 3.1* 2.9*   No results for input(s): LIPASE, AMYLASE in the last 168 hours. Recent Labs  Lab 04/04/20 2034  AMMONIA 14   Coagulation Profile: Recent Labs  Lab 04/04/20 1200  INR 1.1   Cardiac Enzymes: Recent Labs  Lab 04/04/20 0649 04/05/20 0454 04/06/20 0501  CKTOTAL 1,056* 1,693* 1,001*   BNP (last 3 results) No results for input(s): PROBNP in the last 8760 hours. HbA1C: Recent Labs    04/05/20 0454  HGBA1C 5.7*   CBG: No results for  input(s): GLUCAP in the last 168 hours. Lipid Profile: Recent Labs    04/05/20 0454  CHOL 107  HDL 41  LDLCALC 54  TRIG 62  CHOLHDL 2.6   Thyroid Function Tests: Recent Labs    04/04/20 1200  TSH 1.472   Anemia Panel: No results for input(s): VITAMINB12, FOLATE, FERRITIN, TIBC, IRON, RETICCTPCT in the last 72 hours. Sepsis Labs: Recent Labs  Lab 04/04/20 0649 04/04/20 1200  LATICACIDVEN 3.3* 1.5    Recent Results (from the past 240 hour(s))  Resp Panel by RT-PCR (Flu A&B, Covid) Nasopharyngeal Swab     Status: None   Collection Time: 04/02/20  6:06 AM   Specimen: Nasopharyngeal Swab; Nasopharyngeal(NP) swabs in vial transport medium  Result Value Ref Range Status   SARS Coronavirus 2 by RT PCR NEGATIVE NEGATIVE Final    Comment: (NOTE) SARS-CoV-2 target nucleic acids are NOT DETECTED.  The SARS-CoV-2 RNA is generally detectable in upper respiratory specimens during the acute phase of infection. The lowest concentration of SARS-CoV-2 viral copies this assay can detect is 138 copies/mL. A negative result does not preclude SARS-Cov-2 infection and should not be used as the sole basis for treatment or other patient management decisions. A negative result may occur with  improper specimen collection/handling, submission of specimen other than nasopharyngeal swab, presence of viral mutation(s) within the areas targeted by this assay, and inadequate number of viral copies(<138 copies/mL). A negative result must be combined with clinical observations, patient history, and epidemiological information. The expected result is Negative.  Fact Sheet for Patients:  EntrepreneurPulse.com.au  Fact Sheet for Healthcare Providers:  IncredibleEmployment.be  This test is no t yet approved or cleared by the [place] FDA and  has been authorized for detection and/or diagnosis of SARS-CoV-2 by FDA under an Emergency Use Authorization (EUA).  This EUA will remain  in effect (meaning this test can be used) for the duration of the COVID-19  declaration under Section 564(b)(1) of the Act, 21 U.S.C.section 360bbb-3(b)(1), unless the authorization is terminated  or revoked sooner.       Influenza A by PCR NEGATIVE NEGATIVE Final   Influenza B by PCR NEGATIVE NEGATIVE Final    Comment: (NOTE) The Xpert Xpress SARS-CoV-2/FLU/RSV plus assay is intended as an aid in the diagnosis of influenza from Nasopharyngeal swab specimens and should not be used as a sole basis for treatment. Nasal washings and aspirates are unacceptable for Xpert Xpress SARS-CoV-2/FLU/RSV testing.  Fact Sheet for Patients: EntrepreneurPulse.com.au  Fact Sheet for Healthcare Providers: IncredibleEmployment.be  This test is not yet approved or cleared by the [place] FDA and has been authorized for detection and/or diagnosis of SARS-CoV-2 by FDA under an Emergency Use Authorization (EUA). This EUA will remain in effect (meaning this test can be used) for the duration of the COVID-19 declaration under Section 564(b)(1) of the Act, 21 U.S.C. section 360bbb-3(b)(1), unless the authorization is terminated or revoked.  Performed at [facility], [address] [place], [place] [address]   Culture, blood (routine x 2)     Status: None (Preliminary result)   Collection Time: 04/04/20  6:49 AM   Specimen: BLOOD  Result Value Ref Range Status   Specimen Description BLOOD LEFT [facility]  Final   Special Requests   Final    BOTTLES DRAWN AEROBIC AND ANAEROBIC Blood Culture adequate volume   Culture   Final    NO GROWTH 2 DAYS Performed at [facility], [address], [place], [place] [address]    Report Status PENDING  Incomplete  Culture, blood (routine x 2)     Status: None (Preliminary result)   Collection Time: 04/04/20  6:49 AM   Specimen: BLOOD  Result Value Ref Range Status   Specimen  Description BLOOD RIGHT [facility]  Final   Special Requests   Final    BOTTLES DRAWN AEROBIC AND ANAEROBIC Blood Culture adequate volume   Culture   Final    NO GROWTH 2 DAYS Performed at [facility], [address], [place], [place] [address]    Report Status PENDING  Incomplete  Resp Panel by RT-PCR (Flu A&B, Covid) Nasopharyngeal Swab     Status: None   Collection Time: 04/04/20  6:49 AM   Specimen: Nasopharyngeal Swab; Nasopharyngeal(NP) swabs in vial transport medium  Result Value Ref Range Status   SARS Coronavirus 2 by RT PCR NEGATIVE NEGATIVE Final    Comment: (NOTE) SARS-CoV-2 target nucleic acids are NOT DETECTED.  The SARS-CoV-2 RNA is generally detectable in upper respiratory specimens during the acute phase of infection. The lowest concentration of SARS-CoV-2 viral copies this assay can detect is 138 copies/mL. A negative result does not preclude SARS-Cov-2 infection and should not be used as the sole basis for treatment or other patient management decisions. A negative result may occur with  improper specimen collection/handling, submission of specimen other than nasopharyngeal swab, presence of viral mutation(s) within the areas targeted by this assay, and inadequate number of viral copies(<138 copies/mL). A negative result must be combined with clinical observations, patient history, and epidemiological information. The expected result is Negative.  Fact Sheet for Patients:  EntrepreneurPulse.com.au  Fact Sheet for Healthcare Providers:  IncredibleEmployment.be  This test is no t yet approved or cleared by the [place] FDA and  has been authorized for detection and/or diagnosis of SARS-CoV-2 by FDA under an Emergency Use Authorization (EUA). This EUA will remain  in effect (meaning  this test can be used) for the duration of the COVID-19 declaration under Section 564(b)(1) of the Act, 21 U.S.C.section 360bbb-3(b)(1),  unless the authorization is terminated  or revoked sooner.       Influenza A by PCR NEGATIVE NEGATIVE Final   Influenza B by PCR NEGATIVE NEGATIVE Final    Comment: (NOTE) The Xpert Xpress SARS-CoV-2/FLU/RSV plus assay is intended as an aid in the diagnosis of influenza from Nasopharyngeal swab specimens and should not be used as a sole basis for treatment. Nasal washings and aspirates are unacceptable for Xpert Xpress SARS-CoV-2/FLU/RSV testing.  Fact Sheet for Patients: BloggerCourse.com  Fact Sheet for Healthcare Providers: SeriousBroker.it  This test is not yet approved or cleared by the [place] FDA and has been authorized for detection and/or diagnosis of SARS-CoV-2 by FDA under an Emergency Use Authorization (EUA). This EUA will remain in effect (meaning this test can be used) for the duration of the COVID-19 declaration under Section 564(b)(1) of the Act, 21 U.S.C. section 360bbb-3(b)(1), unless the authorization is terminated or revoked.  Performed at [facility], [address], [place], [place] [address]   Urine Culture     Status: Abnormal (Preliminary result)   Collection Time: 04/04/20 12:00 PM   Specimen: Urine, Random  Result Value Ref Range Status   Specimen Description   Final    URINE, RANDOM Performed at [facility], [address], [place], [place] [address]    Special Requests   Final    NONE Performed at [facility], [address] [place], [place] [address]    Culture (A)  Final    >=100,000 COLONIES/mL ESCHERICHIA COLI SUSCEPTIBILITIES TO FOLLOW Performed at [facility], [address], [place], [place] [address]    Report Status PENDING  Incomplete      Imaging Studies   DG Abd Portable 1V  Result Date: 04/05/2020 CLINICAL DATA:  Diarrhea EXAM: PORTABLE ABDOMEN - 1 VIEW COMPARISON:  None. FINDINGS: There is moderate stool in the colon. There  is air throughout much the bowel without bowel dilatation. No air-fluid levels. No free air evident on supine imaging. Lung bases are clear. There are foci of arterial vascular calcification in the pelvis. There are surgical clips near the gastroesophageal junction. IMPRESSION: Moderate stool in the colon. No bowel obstruction or free air evident on supine examination. Lung bases clear. Electronically Signed   By: Bretta Bang III M.D.   On: 04/05/2020 11:25   US ABDOMEN LIMITED RUQ (LIVER/GB)  Result Date: 04/06/2020 CLINICAL DATA:  Abnormal LFTs. EXAM: ULTRASOUND ABDOMEN LIMITED RIGHT UPPER QUADRANT COMPARISON:  04/05/2020. FINDINGS: Gallbladder: Surgically absent. Common bile duct: Diameter: 4.3 mm Liver: No focal lesion identified. Increased parenchymal echogenicity. Portal vein is patent on color Doppler imaging with normal direction of blood flow towards the liver. Other: None. IMPRESSION: Hepatic steatosis.  No focal hepatic lesion. Post cholecystectomy sequela. Electronically Signed   By: Stana Bunting M.D.   On: 04/06/2020 10:42     Medications   Scheduled Meds:  amLODipine  10 mg Oral Daily   aspirin EC  81 mg Oral Daily   calcium-vitamin D  1 tablet Oral Daily   fluticasone  1 spray Each Nare BID   ipratropium  2 spray Each Nare TID   multivitamin with minerals  1 tablet Oral Daily   omega-3 acid ethyl esters  1,000 mg Oral Daily   Continuous Infusions:  sodium chloride     cefTRIAXone (ROCEPHIN)  IV Stopped (04/05/20 1800)   potassium chloride         LOS: 2 days    Time spent: 30 minutes with greater than 50% spent in coordination of care and direct patient contact    Pennie BanterKelly A Avalyn Molino, DO [facility]  04/06/2020, 3:17 PM    If 7PM-7AM, please contact night-coverage. How to contact the [facility] Attending or Consulting provider 7A - 7P or covering provider during after hours 7P -7A, for this patient?    1. Check the care team in [facility] and  look for a) attending/consulting [facility] provider listed and b) the [facility] team listed 2. Log into [email or web address] and use [place]'s universal password to access. If you do not have the password, please contact the hospital operator. 3. Locate the [facility] provider you are looking for under [facility] and page to a number that you can be directly reached. 4. If you still have difficulty reaching the provider, please page the [facility] (Director on Call) for the Hospitalists listed on amion for assistance.

## 2020-04-06 NOTE — Hospital Course (Addendum)
Travis Craig is a 79 y.o. male with medical history significant of hypertension, hyperlipidemia, hard of hearing, dementia, hyponatremia. Patient presented after a fall suffering facial and right-sided rib fractures and found to have rhabdomyolysis and UTI.  Patient's dementia is fairly mild at baseline, per wife's report, but is having significant behavioral disturbances with agitation and combativeness when awake.  Requires sitter and/or wife to be at bedside for patient's safety.  He was seen by ophthalmology and ENT with no surgical intervention needed for facial fractures.  Urine culture with E. Coli, sensitive to Rocephin, completed course.  Treated with IV fluids for rhabdomyolysis which resolved.  Remains on fluids as he is NPO.  Significant ongoing agitation and combativeness due to delirium on top of dementia.  Palliative care consulted and family meeting planned for Friday 10 AM.

## 2020-04-07 DIAGNOSIS — T796XXA Traumatic ischemia of muscle, initial encounter: Secondary | ICD-10-CM | POA: Diagnosis not present

## 2020-04-07 DIAGNOSIS — S2231XA Fracture of one rib, right side, initial encounter for closed fracture: Secondary | ICD-10-CM | POA: Diagnosis not present

## 2020-04-07 DIAGNOSIS — N3001 Acute cystitis with hematuria: Secondary | ICD-10-CM | POA: Diagnosis not present

## 2020-04-07 DIAGNOSIS — G9341 Metabolic encephalopathy: Secondary | ICD-10-CM | POA: Diagnosis not present

## 2020-04-07 LAB — COMPREHENSIVE METABOLIC PANEL
ALT: 67 U/L — ABNORMAL HIGH (ref 0–44)
AST: 185 U/L — ABNORMAL HIGH (ref 15–41)
Albumin: 3 g/dL — ABNORMAL LOW (ref 3.5–5.0)
Alkaline Phosphatase: 56 U/L (ref 38–126)
Anion gap: 8 (ref 5–15)
BUN: 13 mg/dL (ref 8–23)
CO2: 26 mmol/L (ref 22–32)
Calcium: 8.3 mg/dL — ABNORMAL LOW (ref 8.9–10.3)
Chloride: 105 mmol/L (ref 98–111)
Creatinine, Ser: 0.6 mg/dL — ABNORMAL LOW (ref 0.61–1.24)
GFR, Estimated: >60 mL/min (ref >60)
Glucose, Bld: 118 mg/dL — ABNORMAL HIGH (ref 70–99)
Potassium: 2.8 mmol/L — ABNORMAL LOW (ref 3.5–5.1)
Sodium: 139 mmol/L (ref 135–145)
Total Bilirubin: 1.9 mg/dL — ABNORMAL HIGH (ref 0.3–1.2)
Total Protein: 6.1 g/dL — ABNORMAL LOW (ref 6.5–8.1)

## 2020-04-07 LAB — CBC
HCT: 34.6 % — ABNORMAL LOW (ref 39.0–52.0)
Hemoglobin: 12.8 g/dL — ABNORMAL LOW (ref 13.0–17.0)
MCH: 35.7 pg — ABNORMAL HIGH (ref 26.0–34.0)
MCHC: 37 g/dL — ABNORMAL HIGH (ref 30.0–36.0)
MCV: 96.4 fL (ref 80.0–100.0)
Platelets: 153 10*3/uL (ref 150–400)
RBC: 3.59 MIL/uL — ABNORMAL LOW (ref 4.22–5.81)
RDW: 11.7 % (ref 11.5–15.5)
WBC: 8.4 10*3/uL (ref 4.0–10.5)
nRBC: 0 % (ref 0.0–0.2)

## 2020-04-07 LAB — URINE CULTURE: Culture: >=100000 — AB

## 2020-04-07 LAB — CK: Total CK: 984 U/L — ABNORMAL HIGH (ref 49–397)

## 2020-04-07 MED ORDER — POTASSIUM CHLORIDE 20 MEQ PO PACK
40.0000 meq | PACK | ORAL | Status: AC
  Administered 2020-04-07: 09:00:00 40 meq via ORAL
  Filled 2020-04-07: qty 2

## 2020-04-07 MED ORDER — OLANZAPINE 10 MG IM SOLR
2.5000 mg | Freq: Once | INTRAMUSCULAR | Status: AC
  Administered 2020-04-07: 01:00:00 2.5 mg via INTRAMUSCULAR
  Filled 2020-04-07: qty 10

## 2020-04-07 MED ORDER — QUETIAPINE FUMARATE 25 MG PO TABS
50.0000 mg | ORAL_TABLET | Freq: Every day | ORAL | Status: DC
  Administered 2020-04-07: 22:00:00 50 mg via ORAL
  Filled 2020-04-07: qty 2

## 2020-04-07 NOTE — Progress Notes (Signed)
PROGRESS NOTE    Travis Craig   JSE:[id number]  DOB: 1940-11-01  PCP: Smitty Cords, DO    DOA: 04/04/2020 LOS: 3   Brief Narrative   Travis Craig is a 79 y.o. male with medical history significant of hypertension, hyperlipidemia, hard of hearing, dementia, hyponatremia. Patient presented after a fall suffering facial and right-sided rib fractures and found to have rhabdomyolysis and UTI.  Patient's dementia is fairly mild at baseline, per wife's report, but is having some behavioral disturbances with agitation requiring sitter and/or wife to be at bedside for patient's safety.  He was seen by ophthalmology and ENT with no surgical intervention needed for facial fractures.  Urine culture growing E. Coli.  Patient on empiric Rocephin pending susceptibilities.  On IV fluids for rhabdomyolysis.     Assessment & Plan   Principal Problem:   Elevated troponin Active Problems:   Essential hypertension   Mixed hyperlipidemia   Hyponatremia   Fall at home, initial encounter   Rhabdomyolysis   Hypokalemia   Thrombocytopenia (HCC)   Diarrhea   Right rib fracture   Lactic acidosis   Fever   Orbital floor fracture (HCC)   Pterygoid plate fracture (HCC)   Abnormal LFTs   Acute metabolic encephalopathy   UTI (urinary tract infection)   Fall -with significant trauma and multiple facial and rib fractures as outlined below.  Fall was likely due to underlying infection.    Orbital floor fracture Pterygoid plate fracture Right rib fracture Patient evaluated by ophthalmology and ENT.  No need for surgical intervention. -ENT recommendations: Soft diet, avoid blowing nose -Oxycodone as needed -Consider scheduled Tylenol if LFTs improving  Traumatic rhabdomyolysis -secondary to fall.  CK is trending downward with IV fluids. Continue normal saline at 100 cc/h.  Monitor CMP and CK daily.  Urinary tract infection -present on admission.  Urine culture growing  E. coli which is sensitive to Rocephin.  Continue Rocephin while inpatient.  Follow-up blood cultures.  On IV fluids for rhabdo.    Fever secondary to UTI -resolved.  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 Patient seems to do better with wife present at bedside.   --Continue one-to-one sitter for safety, especially when wife not present ----Delirium precautions:     -Lights and TV off, minimize interruptions at night    -Blinds open and lights on during day    -Glasses/hearing aid with patient    -Frequent reorientation    -PT/OT when able    -Avoid sedation medications/Beers list medications --Trial of Seroquel at bedtime   Elevated AST/ALT - in setting of rhabdo.  Right upper quadrant ultrasound unremarkable, showed hepatic steatosis, no focal hepatic lesions and postcholecystectomy sequelae.  Elevated troponin - attributed to demand ischemia in setting of fall, infection.  Cardiology was consulted. EKG and Echo were unremarkable.  Due to dementia, not good candidate for ischemic evaluation.  Cardiology has signed off.  Patient is free of chest pain.  Essential Hpertension - on amlodipine  Hyperlipidemia - statin on hold for now.  Thrombocytopenia - suspect reactive.  Monitor CBC.  Hypokalemia - replaced, monitor BMP and replace as needed  Diarrhea - resolved.  Lactic acidosis - resolved with IV hydration   DVT prophylaxis: SCDs Start: 04/05/20 0101   Diet:  Diet Orders (From admission, onward)    Start     Ordered   04/05/20 1038  DIET SOFT Room service appropriate? Yes; Fluid consistency: Thin  Diet  effective now       Question Answer Comment  Room service appropriate? Yes   Fluid consistency: Thin      04/05/20 1037            Code Status: Full Code    Subjective 04/07/20    Patient seen with wife at bedside.  Patient is sleeping and was reportedly awake and quite combative much  of the night.  Appears patient was given Zyprexa last night.  Discussed scheduled evening medication to hopefully get patient back on normal wake sleep cycle.  She is agreeable to Seroquel.   Disposition Plan & Communication   Status is: Inpatient  Remains inpatient appropriate because:IV treatments appropriate due to intensity of illness or inability to take PO.  Patient having behavioral disturbances consistent with hospital delirium superimposed on dementia requiring sitter for safety.     Dispo: The patient is from: Home              Anticipated d/c is to: Home              Anticipated d/c date is: 2 to 3 days              Patient currently is not medically stable to d/c.   Family Communication: Wife is at bedside on rounds today   Consults, Procedures, Significant Events   Consultants:   Ophthalmology  ENT  Cardiology  Neurosurgery  Procedures:   Laceration repair  Antimicrobials:  Anti-infectives (From admission, onward)   Start     Dose/Rate Route Frequency Ordered Stop   04/04/20 1800  cefTRIAXone (ROCEPHIN) 1 g in sodium chloride 0.9 % 100 mL IVPB        1 g 200 mL/hr over 30 Minutes Intravenous Every 24 hours 04/04/20 1652          Objective   Vitals:   04/07/20 1231 04/07/20 1235 04/07/20 1343 04/07/20 1533  BP: 118/88  111/68 117/72  Pulse: 60  (!) 53 (!) 52  Resp: 18  18 17   Temp: 98.7 F (37.1 C) 98.7 F (37.1 C) 98.7 F (37.1 C) 98.3 F (36.8 C)  TempSrc: Oral Oral  Oral  SpO2: 99%  100% 100%  Weight:      Height:        Intake/Output Summary (Last 24 hours) at 04/07/2020 1603 Last data filed at 04/07/2020 1330 Gross per 24 hour  Intake 972.11 ml  Output 1600 ml  Net -627.89 ml   Filed Weights   04/04/20 0621 04/05/20 0332  Weight: 83.5 kg 79.4 kg    Physical Exam:  General exam: awake, alert, no acute distress HEENT: Severe bilateral periorbital ecchymosis, eyes closed, right upper forehead laceration repaired and without  bleeding or surrounding signs of infection Respiratory system: CTAB, normal respiratory effort, on room air. Cardiovascular system: normal S1/S2, RRR, no pedal edema.   Gastrointestinal system: soft, nontender Central nervous system: Unable to fully evaluate as patient is somnolent and not following commands   Labs   Data Reviewed: I have personally reviewed following labs and imaging studies  CBC: Recent Labs  Lab 04/02/20 0548 04/04/20 0649 04/05/20 0454 04/06/20 0501 04/07/20 0516  WBC 7.0 7.7 9.4 10.7* 8.4  NEUTROABS 6.2 6.8  --   --   --   HGB 13.6 12.5* 12.1* 12.0* 12.8*  HCT 39.4 34.4* 34.4* 33.0* 34.6*  MCV 102.3* 97.5 98.9 97.6 96.4  PLT 126* 106* 104* 124* 235   Basic Metabolic Panel: Recent Labs  Lab 04/04/20 0955 04/04/20 1200 04/04/20 2034 04/05/20 0454 04/06/20 0501 04/07/20 0516  NA  --  130* 135 135 141 139  K  --  3.3* 3.4* 3.7 3.1* 2.8*  CL  --  99 102 104 108 105  CO2  --  22 23 22 26 26   GLUCOSE  --  113* 113* 107* 126* 118*  BUN  --  21 18 18 18 13   CREATININE  --  0.99 0.84 0.79 0.60* 0.60*  CALCIUM  --  8.4* 8.7* 8.5* 8.2* 8.3*  MG 1.8  --   --   --   --   --    GFR: Estimated Creatinine Clearance: 78.4 mL/min (A) (by C-G formula based on SCr of 0.6 mg/dL (L)). Liver Function Tests: Recent Labs  Lab 04/02/20 0548 04/04/20 0649 04/05/20 0454 04/06/20 0501 04/07/20 0516  AST 31 159* 187* 186* 185*  ALT 23 52* 47* 58* 67*  ALKPHOS 59 48 43 49 56  BILITOT 1.6* 1.5* 1.8* 1.9* 1.9*  PROT 7.1 6.5 6.1* 5.9* 6.1*  ALBUMIN 3.8 3.4* 3.1* 2.9* 3.0*   No results for input(s): LIPASE, AMYLASE in the last 168 hours. Recent Labs  Lab 04/04/20 2034  AMMONIA 14   Coagulation Profile: Recent Labs  Lab 04/04/20 1200  INR 1.1   Cardiac Enzymes: Recent Labs  Lab 04/04/20 0649 04/05/20 0454 04/06/20 0501 04/07/20 0516  CKTOTAL 1,056* 1,693* 1,001* 984*   BNP (last 3 results) No results for input(s): PROBNP in the last 8760  hours. HbA1C: Recent Labs    04/05/20 0454  HGBA1C 5.7*   CBG: No results for input(s): GLUCAP in the last 168 hours. Lipid Profile: Recent Labs    04/05/20 0454  CHOL 107  HDL 41  LDLCALC 54  TRIG 62  CHOLHDL 2.6   Thyroid Function Tests: No results for input(s): TSH, T4TOTAL, FREET4, T3FREE, THYROIDAB in the last 72 hours. Anemia Panel: No results for input(s): VITAMINB12, FOLATE, FERRITIN, TIBC, IRON, RETICCTPCT in the last 72 hours. Sepsis Labs: Recent Labs  Lab 04/04/20 0649 04/04/20 1200  LATICACIDVEN 3.3* 1.5    Recent Results (from the past 240 hour(s))  Resp Panel by RT-PCR (Flu A&B, Covid) Nasopharyngeal Swab     Status: None   Collection Time: 04/02/20  6:06 AM   Specimen: Nasopharyngeal Swab; Nasopharyngeal(NP) swabs in vial transport medium  Result Value Ref Range Status   SARS Coronavirus 2 by RT PCR NEGATIVE NEGATIVE Final    Comment: (NOTE) SARS-CoV-2 target nucleic acids are NOT DETECTED.  The SARS-CoV-2 RNA is generally detectable in upper respiratory specimens during the acute phase of infection. The lowest concentration of SARS-CoV-2 viral copies this assay can detect is 138 copies/mL. A negative result does not preclude SARS-Cov-2 infection and should not be used as the sole basis for treatment or other patient management decisions. A negative result may occur with  improper specimen collection/handling, submission of specimen other than nasopharyngeal swab, presence of viral mutation(s) within the areas targeted by this assay, and inadequate number of viral copies(<138 copies/mL). A negative result must be combined with clinical observations, patient history, and epidemiological information. The expected result is Negative.  Fact Sheet for Patients:  BloggerCourse.comhttps://www.fda.gov/media/152166/download  Fact Sheet for Healthcare Providers:  SeriousBroker.ithttps://www.fda.gov/media/152162/download  This test is no t yet approved or cleared by the [place]  FDA and  has been authorized for detection and/or diagnosis of SARS-CoV-2 by FDA under an Emergency Use Authorization (EUA). This EUA will remain  in  effect (meaning this test can be used) for the duration of the COVID-19 declaration under Section 564(b)(1) of the Act, 21 U.S.C.section 360bbb-3(b)(1), unless the authorization is terminated  or revoked sooner.       Influenza A by PCR NEGATIVE NEGATIVE Final   Influenza B by PCR NEGATIVE NEGATIVE Final    Comment: (NOTE) The Xpert Xpress SARS-CoV-2/FLU/RSV plus assay is intended as an aid in the diagnosis of influenza from Nasopharyngeal swab specimens and should not be used as a sole basis for treatment. Nasal washings and aspirates are unacceptable for Xpert Xpress SARS-CoV-2/FLU/RSV testing.  Fact Sheet for Patients: BloggerCourse.com  Fact Sheet for Healthcare Providers: SeriousBroker.it  This test is not yet approved or cleared by the [place] FDA and has been authorized for detection and/or diagnosis of SARS-CoV-2 by FDA under an Emergency Use Authorization (EUA). This EUA will remain in effect (meaning this test can be used) for the duration of the COVID-19 declaration under Section 564(b)(1) of the Act, 21 U.S.C. section 360bbb-3(b)(1), unless the authorization is terminated or revoked.  Performed at [facility], [address] [place], [place] [address]   Culture, blood (routine x 2)     Status: None (Preliminary result)   Collection Time: 04/04/20  6:49 AM   Specimen: BLOOD  Result Value Ref Range Status   Specimen Description BLOOD LEFT [facility]  Final   Special Requests   Final    BOTTLES DRAWN AEROBIC AND ANAEROBIC Blood Culture adequate volume   Culture   Final    NO GROWTH 3 DAYS Performed at [facility], [address] [place], [place] [address]    Report Status PENDING  Incomplete  Culture, blood (routine x 2)     Status: None  (Preliminary result)   Collection Time: 04/04/20  6:49 AM   Specimen: BLOOD  Result Value Ref Range Status   Specimen Description BLOOD RIGHT [facility]  Final   Special Requests   Final    BOTTLES DRAWN AEROBIC AND ANAEROBIC Blood Culture adequate volume   Culture   Final    NO GROWTH 3 DAYS Performed at [facility], [address], [place], [place] [address]    Report Status PENDING  Incomplete  Resp Panel by RT-PCR (Flu A&B, Covid) Nasopharyngeal Swab     Status: None   Collection Time: 04/04/20  6:49 AM   Specimen: Nasopharyngeal Swab; Nasopharyngeal(NP) swabs in vial transport medium  Result Value Ref Range Status   SARS Coronavirus 2 by RT PCR NEGATIVE NEGATIVE Final    Comment: (NOTE) SARS-CoV-2 target nucleic acids are NOT DETECTED.  The SARS-CoV-2 RNA is generally detectable in upper respiratory specimens during the acute phase of infection. The lowest concentration of SARS-CoV-2 viral copies this assay can detect is 138 copies/mL. A negative result does not preclude SARS-Cov-2 infection and should not be used as the sole basis for treatment or other patient management decisions. A negative result may occur with  improper specimen collection/handling, submission of specimen other than nasopharyngeal swab, presence of viral mutation(s) within the areas targeted by this assay, and inadequate number of viral copies(<138 copies/mL). A negative result must be combined with clinical observations, patient history, and epidemiological information. The expected result is Negative.  Fact Sheet for Patients:  BloggerCourse.com  Fact Sheet for Healthcare Providers:  SeriousBroker.it  This test is no t yet approved or cleared by the [place] FDA and  has been authorized for detection and/or diagnosis of SARS-CoV-2 by FDA under  an Emergency Use Authorization (EUA). This EUA will remain  in effect (meaning this test can be  used) for the duration of the COVID-19 declaration under Section 564(b)(1) of the Act, 21 U.S.C.section 360bbb-3(b)(1), unless the authorization is terminated  or revoked sooner.       Influenza A by PCR NEGATIVE NEGATIVE Final   Influenza B by PCR NEGATIVE NEGATIVE Final    Comment: (NOTE) The Xpert Xpress SARS-CoV-2/FLU/RSV plus assay is intended as an aid in the diagnosis of influenza from Nasopharyngeal swab specimens and should not be used as a sole basis for treatment. Nasal washings and aspirates are unacceptable for Xpert Xpress SARS-CoV-2/FLU/RSV testing.  Fact Sheet for Patients: BloggerCourse.com  Fact Sheet for Healthcare Providers: SeriousBroker.it  This test is not yet approved or cleared by the [place] FDA and has been authorized for detection and/or diagnosis of SARS-CoV-2 by FDA under an Emergency Use Authorization (EUA). This EUA will remain in effect (meaning this test can be used) for the duration of the COVID-19 declaration under Section 564(b)(1) of the Act, 21 U.S.C. section 360bbb-3(b)(1), unless the authorization is terminated or revoked.  Performed at [facility], [address] [place], [place] [address]   Urine Culture     Status: Abnormal   Collection Time: 04/04/20 12:00 PM   Specimen: Urine, Random  Result Value Ref Range Status   Specimen Description   Final    URINE, RANDOM Performed at [facility], [address] [place], [place] [address]    Special Requests   Final    NONE Performed at [facility], [address] [place], [place] [address]    Culture >=100,000 COLONIES/mL ESCHERICHIA COLI (A)  Final   Report Status 04/07/2020 FINAL  Final   Organism ID, Bacteria ESCHERICHIA COLI (A)  Final      Susceptibility   Escherichia coli - MIC*    AMPICILLIN >=32 RESISTANT Resistant     CEFAZOLIN >=64 RESISTANT Resistant     CEFEPIME <=0.12 SENSITIVE  Sensitive     CEFTRIAXONE 1 SENSITIVE Sensitive     CIPROFLOXACIN <=0.25 SENSITIVE Sensitive     GENTAMICIN <=1 SENSITIVE Sensitive     IMIPENEM <=0.25 SENSITIVE Sensitive     NITROFURANTOIN <=16 SENSITIVE Sensitive     TRIMETH/SULFA <=20 SENSITIVE Sensitive     AMPICILLIN/SULBACTAM >=32 RESISTANT Resistant     PIP/TAZO 8 SENSITIVE Sensitive     * >=100,000 COLONIES/mL ESCHERICHIA COLI      Imaging Studies   US ABDOMEN LIMITED RUQ (LIVER/GB)  Result Date: 04/06/2020 CLINICAL DATA:  Abnormal LFTs. EXAM: ULTRASOUND ABDOMEN LIMITED RIGHT UPPER QUADRANT COMPARISON:  04/05/2020. FINDINGS: Gallbladder: Surgically absent. Common bile duct: Diameter: 4.3 mm Liver: No focal lesion identified. Increased parenchymal echogenicity. Portal vein is patent on color Doppler imaging with normal direction of blood flow towards the liver. Other: None. IMPRESSION: Hepatic steatosis.  No focal hepatic lesion. Post cholecystectomy sequela. Electronically Signed   By: Stana Bunting M.D.   On: 04/06/2020 10:42     Medications   Scheduled Meds: . amLODipine  10 mg Oral Daily  . aspirin EC  81 mg Oral Daily  . calcium-vitamin D  1 tablet Oral Daily  . fluticasone  1 spray Each Nare BID  . ipratropium  2 spray Each Nare TID  . multivitamin with minerals  1 tablet Oral Daily  . omega-3 acid ethyl esters  1,000 mg Oral Daily  . potassium chloride  40 mEq Oral Q4H  .  QUEtiapine  50 mg Oral QHS   Continuous Infusions: . sodium chloride 100 mL/hr at 04/06/20 1533  . cefTRIAXone (ROCEPHIN)  IV 1 g (04/06/20 1808)       LOS: 3 days    Time spent: 25 minutes with greater than 50% spent in coordination of care and direct patient contact    Ezekiel Slocumb, DO [facility]  04/07/2020, 4:03 PM    If 7PM-7AM, please contact night-coverage. How to contact the [facility] Attending or Consulting provider [place] or covering provider during after hours [place], for this patient?    1. Check the  care team in [facility] and look for a) attending/consulting TR[facility] provider listed and b) the [facility] team listed 2. Log into [email or web address] and use [place]'s universal password to access. If you do not have the password, please contact the hospital operator. 3. Locate the [facility] provider you are looking for under [facility] and page to a number that you can be directly reached. 4. If you still have difficulty reaching the provider, please page the [facility] (Director on Call) for the Hospitalists listed on amion for assistance.

## 2020-04-07 NOTE — Care Management Important Message (Signed)
Important Message  Patient Details  Name: Travis Craig MRN: [id number] Date of Birth: 11-19-1940   Medicare Important Message Given:  Yes     [name] 04/07/2020, 11:17 AM

## 2020-04-07 NOTE — Progress Notes (Signed)
PT Cancellation Note  Patient Details Name: Travis Craig MRN: [id number] DOB: Feb 08, 1941   Cancelled Treatment:    Reason Eval/Treat Not Completed: Other (comment).  Nursing recommending holding physical therapy d/t concerns with pt's agitation/behavior and not following commands (safety concerns).  Pt soundly sleeping in bed and pt's wife reports pt has not slept much and pt's behavior has been difficult.  NT preferring not to wake pt at this time.  Will hold PT at this time and re-attempt PT evaluation at a later date/time as medically appropriate.  Leitha Bleak, PT 04/07/20, 2:05 PM

## 2020-04-07 NOTE — Progress Notes (Signed)
OT Cancellation Note  Patient Details Name: Travis Craig MRN: [id number] DOB: 29-Jul-1940   Cancelled Treatment:    Reason Eval/Treat Not Completed: Medical issues which prohibited therapy. Order received, chart reviewed. Pt has a K+ level of 2.8 -- outside the recommended guidelines for rehabilitation services. Will follow this pt's progress and complete evaluation when he is medically appropriate.  Josiah Lobo, PhD, [place], OTR/L ascom [phone] 04/07/20, 9:22 AM

## 2020-04-08 DIAGNOSIS — I10 Essential (primary) hypertension: Secondary | ICD-10-CM | POA: Diagnosis not present

## 2020-04-08 DIAGNOSIS — G9341 Metabolic encephalopathy: Secondary | ICD-10-CM | POA: Diagnosis not present

## 2020-04-08 DIAGNOSIS — N3001 Acute cystitis with hematuria: Secondary | ICD-10-CM | POA: Diagnosis not present

## 2020-04-08 DIAGNOSIS — T796XXA Traumatic ischemia of muscle, initial encounter: Secondary | ICD-10-CM | POA: Diagnosis not present

## 2020-04-08 LAB — COMPREHENSIVE METABOLIC PANEL
ALT: 71 U/L — ABNORMAL HIGH (ref 0–44)
AST: 152 U/L — ABNORMAL HIGH (ref 15–41)
Albumin: 3.1 g/dL — ABNORMAL LOW (ref 3.5–5.0)
Alkaline Phosphatase: 57 U/L (ref 38–126)
Anion gap: 9 (ref 5–15)
BUN: 23 mg/dL (ref 8–23)
CO2: 25 mmol/L (ref 22–32)
Calcium: 8.7 mg/dL — ABNORMAL LOW (ref 8.9–10.3)
Chloride: 110 mmol/L (ref 98–111)
Creatinine, Ser: 0.64 mg/dL (ref 0.61–1.24)
GFR, Estimated: >60 mL/min (ref >60)
Glucose, Bld: 117 mg/dL — ABNORMAL HIGH (ref 70–99)
Potassium: 3.4 mmol/L — ABNORMAL LOW (ref 3.5–5.1)
Sodium: 144 mmol/L (ref 135–145)
Total Bilirubin: 2 mg/dL — ABNORMAL HIGH (ref 0.3–1.2)
Total Protein: 6.4 g/dL — ABNORMAL LOW (ref 6.5–8.1)

## 2020-04-08 LAB — CK: Total CK: 625 U/L — ABNORMAL HIGH (ref 49–397)

## 2020-04-08 MED ORDER — HALOPERIDOL LACTATE 5 MG/ML IJ SOLN
2.0000 mg | Freq: Once | INTRAMUSCULAR | Status: AC
  Administered 2020-04-08: 23:00:00 2 mg via INTRAVENOUS
  Filled 2020-04-08: qty 1

## 2020-04-08 MED ORDER — CEFTRIAXONE SODIUM 1 G IJ SOLR
1.0000 g | INTRAMUSCULAR | Status: AC
  Administered 2020-04-08 – 2020-04-10 (×3): 1 g via INTRAVENOUS
  Filled 2020-04-08: qty 1
  Filled 2020-04-08: qty 10
  Filled 2020-04-08: qty 1

## 2020-04-08 MED ORDER — POTASSIUM CHLORIDE 20 MEQ PO PACK: 40.0000 meq | PACK | Freq: Once | ORAL | Status: DC

## 2020-04-08 MED ORDER — QUETIAPINE FUMARATE 25 MG PO TABS
25.0000 mg | ORAL_TABLET | Freq: Every day | ORAL | Status: DC
  Filled 2020-04-08: qty 1

## 2020-04-08 NOTE — Progress Notes (Addendum)
.   Pt suppose to start on seroquel 04/08/20 2200 but was placed NPO and unable to follow commands . Notify NP Ouma. Will continue to monitor.  NP Ouma ordered Haloperidol 2 mg IV once. Will continue to monitor.  Update 0243: Pt is complaining of all over and only have percocet PO but pt was NPO. Notify NP Ouma. Will continue to monitor.  Update 0415: NP Ouma ordered fantanyl 25 mcg IV once. Will continue to monitor.  Update 0445: Pt was agitated and trying to get up and swinging at staff. Staff nurse went in the room and was cussing towards a staff. Pt pulled out his external catheter and tried to pull out his IV. Notify NP Ouma. Will continue to monitor.  Update 0454: NP Ouma ordered Ativan 1 mg IV once. Will continue to monitor.

## 2020-04-08 NOTE — Progress Notes (Signed)
PROGRESS NOTE    Travis Craig   [id number]  DOB: 1940/08/04  PCP: Olin Hauser, DO    DOA: 04/04/2020 LOS: 4   Brief Narrative   Travis Craig is a 79 y.o. male with medical history significant of hypertension, hyperlipidemia, hard of hearing, dementia, hyponatremia. Patient presented after a fall suffering facial and right-sided rib fractures and found to have rhabdomyolysis and UTI.  Patient's dementia is fairly mild at baseline, per wife's report, but is having some behavioral disturbances with agitation requiring sitter and/or wife to be at bedside for patient's safety.  He was seen by ophthalmology and ENT with no surgical intervention needed for facial fractures.  Urine culture growing E. Coli.  Patient on empiric Rocephin pending susceptibilities.  On IV fluids for rhabdomyolysis.     Assessment & Plan   Principal Problem:   Elevated troponin Active Problems:   Essential hypertension   Mixed hyperlipidemia   Hyponatremia   Fall at home, initial encounter   Rhabdomyolysis   Hypokalemia   Thrombocytopenia (HCC)   Diarrhea   Right rib fracture   Lactic acidosis   Fever   Orbital floor fracture (HCC)   Pterygoid plate fracture (HCC)   Abnormal LFTs   Acute metabolic encephalopathy   UTI (urinary tract infection)   Fall -with significant trauma and multiple facial and rib fractures as outlined below.  Fall was likely due to underlying infection.    Orbital floor fracture Pterygoid plate fracture Right rib fracture Patient evaluated by ophthalmology and ENT.  No need for surgical intervention. -ENT recommendations: Soft diet, avoid blowing nose -Oxycodone as needed -Consider scheduled Tylenol if LFTs improving Patient has not complained of any pain per wife.  Traumatic rhabdomyolysis -secondary to fall.   CK is trending downward with IV fluids. Continue normal saline at 100 cc/h.  Monitor CMP and CK daily. Expect CK normal in  next couple days.  Urinary tract infection -present on admission.  Urine culture growing E. coli which is sensitive to Rocephin.  Continue Rocephin while inpatient.  Follow-up blood cultures.  On IV fluids for rhabdo.    Fever secondary to UTI -resolved.  Management as above  Acute metabolic encephalopathy superimposed on  Dementia with behavioral disturbance in setting of infection and hospital environment.   Patient does better with wife present at bedside.   --one-to-one sitter for safety, especially when wife not present ----Delirium precautions:     -Lights and TV off, minimize interruptions at night    -Blinds open and lights on during day    -Glasses/hearing aid with patient    -Frequent reorientation    -PT/OT when able    -Avoid sedation medications/Beers list medications --Trial of Seroquel at bedtime, reduce dose, hold if pt sedated   Elevated AST/ALT - in setting of rhabdo.  Right upper quadrant ultrasound unremarkable, showed hepatic steatosis, no focal hepatic lesions and postcholecystectomy sequelae.  Elevated troponin - attributed to demand ischemia in setting of fall, infection.  Cardiology was consulted. EKG and Echo were unremarkable.  Due to dementia, not good candidate for ischemic evaluation.  Cardiology has signed off.  Patient is free of chest pain.  Essential Hpertension - on amlodipine  Hyperlipidemia - statin on hold for now.  Thrombocytopenia - suspect reactive.  Monitor CBC.  Hypokalemia - replaced, monitor BMP and replace as needed  Diarrhea - resolved.  Lactic acidosis - resolved with IV hydration   DVT prophylaxis: SCDs Start: 04/05/20 0101   Diet:  Diet Orders (  From admission, onward)    Start     Ordered   04/08/20 1233  Diet NPO time specified  Diet effective now        04/08/20 1233            Code Status: Full Code    Subjective 04/08/20    Patient seen with wife at bedside.  Patient again sleeping but wife reported  he was up with PT earlier and reportedly did quite well.  He had his eyes open earlier too.  She hopes he will able to return home soon.  Discussed reducing Seroquel and get patient more awake and eating / drinking again.   Disposition Plan & Communication   Status is: Inpatient  Remains inpatient appropriate because:IV treatments appropriate due to intensity of illness or inability to take PO.  Inadequate PO intake with ongoing acute delirium.  Still on IV fluids for rhabdomyolysis.   Dispo: The patient is from: Home              Anticipated d/c is to: Home              Anticipated d/c date is: 2 to 3 days              Patient currently is not medically stable to d/c.   Family Communication: Wife is at bedside on rounds today   Consults, Procedures, Significant Events   Consultants:   Ophthalmology  ENT  Cardiology  Neurosurgery  Procedures:   Laceration repair  Antimicrobials:  Anti-infectives (From admission, onward)   Start     Dose/Rate Route Frequency Ordered Stop   04/08/20 1800  cefTRIAXone (ROCEPHIN) 1 g in sodium chloride 0.9 % 100 mL IVPB        1 g 200 mL/hr over 30 Minutes Intravenous Every 24 hours 04/08/20 1316 04/11/20 1759   04/04/20 1800  cefTRIAXone (ROCEPHIN) 1 g in sodium chloride 0.9 % 100 mL IVPB  Status:  Discontinued        1 g 200 mL/hr over 30 Minutes Intravenous Every 24 hours 04/04/20 1652 04/08/20 1316        Objective   Vitals:   04/08/20 0328 04/08/20 0736 04/08/20 1118 04/08/20 1534  BP: (!) 163/84 (!) 174/99 (!) 147/80 119/77  Pulse: 65 69 (!) 54 (!) 56  Resp: 18  18 18   Temp: (!) 97.5 F (36.4 C) 98.1 F (36.7 C) 98.1 F (36.7 C) (!) 97.5 F (36.4 C)  TempSrc: Axillary Oral Axillary Axillary  SpO2: 96% 94% 96% 96%  Weight: 83.5 kg     Height:        Intake/Output Summary (Last 24 hours) at 04/08/2020 1752 Last data filed at 04/08/2020 1600 Gross per 24 hour  Intake 0 ml  Output 1650 ml  Net -1650 ml   Filed  Weights   04/04/20 0621 04/05/20 0332 04/08/20 0328  Weight: 83.5 kg 79.4 kg 83.5 kg    Physical Exam:  General exam: somnolent, no acute distress HEENT: Severe bilateral periorbital ecchymosis, eyes closed, right upper forehead laceration repaired and without bleeding or surrounding signs of infection Respiratory system: CTAB, normal respiratory effort, on room air. Cardiovascular system: normal S1/S2, RRR, no pedal edema.   Gastrointestinal system: soft, nontender Central nervous system: Unable to fully evaluate as patient is somnolent and not following commands   Labs   Data Reviewed: I have personally reviewed following labs and imaging studies  CBC: Recent Labs  Lab 04/02/20 0548 04/04/20 [id number]  04/05/20 0454 04/06/20 0501 04/07/20 0516  WBC 7.0 7.7 9.4 10.7* 8.4  NEUTROABS 6.2 6.8  --   --   --   HGB 13.6 12.5* 12.1* 12.0* 12.8*  HCT 39.4 34.4* 34.4* 33.0* 34.6*  MCV 102.3* 97.5 98.9 97.6 96.4  PLT 126* 106* 104* 124* 409   Basic Metabolic Panel: Recent Labs  Lab 04/04/20 0955 04/04/20 1200 04/04/20 2034 04/05/20 0454 04/06/20 0501 04/07/20 0516 04/08/20 0810  NA  --    < > 135 135 141 139 144  K  --    < > 3.4* 3.7 3.1* 2.8* 3.4*  CL  --    < > 102 104 108 105 110  CO2  --    < > 23 22 26 26 25   GLUCOSE  --    < > 113* 107* 126* 118* 117*  BUN  --    < > 18 18 18 13 23   CREATININE  --    < > 0.84 0.79 0.60* 0.60* 0.64  CALCIUM  --    < > 8.7* 8.5* 8.2* 8.3* 8.7*  MG 1.8  --   --   --   --   --   --    < > = values in this interval not displayed.   GFR: Estimated Creatinine Clearance: 78.4 mL/min (by C-G formula based on SCr of 0.64 mg/dL). Liver Function Tests: Recent Labs  Lab 04/04/20 0649 04/05/20 0454 04/06/20 0501 04/07/20 0516 04/08/20 0810  AST 159* 187* 186* 185* 152*  ALT 52* 47* 58* 67* 71*  ALKPHOS 48 43 49 56 57  BILITOT 1.5* 1.8* 1.9* 1.9* 2.0*  PROT 6.5 6.1* 5.9* 6.1* 6.4*  ALBUMIN 3.4* 3.1* 2.9* 3.0* 3.1*   No results for  input(s): LIPASE, AMYLASE in the last 168 hours. Recent Labs  Lab 04/04/20 2034  AMMONIA 14   Coagulation Profile: Recent Labs  Lab 04/04/20 1200  INR 1.1   Cardiac Enzymes: Recent Labs  Lab 04/04/20 0649 04/05/20 0454 04/06/20 0501 04/07/20 0516 04/08/20 0810  CKTOTAL 1,056* 1,693* 1,001* 984* 625*   BNP (last 3 results) No results for input(s): PROBNP in the last 8760 hours. HbA1C: No results for input(s): HGBA1C in the last 72 hours. CBG: No results for input(s): GLUCAP in the last 168 hours. Lipid Profile: No results for input(s): CHOL, HDL, LDLCALC, TRIG, CHOLHDL, LDLDIRECT in the last 72 hours. Thyroid Function Tests: No results for input(s): TSH, T4TOTAL, FREET4, T3FREE, THYROIDAB in the last 72 hours. Anemia Panel: No results for input(s): VITAMINB12, FOLATE, FERRITIN, TIBC, IRON, RETICCTPCT in the last 72 hours. Sepsis Labs: Recent Labs  Lab 04/04/20 0649 04/04/20 1200  LATICACIDVEN 3.3* 1.5    Recent Results (from the past 240 hour(s))  Resp Panel by RT-PCR (Flu A&B, Covid) Nasopharyngeal Swab     Status: None   Collection Time: 04/02/20  6:06 AM   Specimen: Nasopharyngeal Swab; Nasopharyngeal(NP) swabs in vial transport medium  Result Value Ref Range Status   SARS Coronavirus 2 by RT PCR NEGATIVE NEGATIVE Final    Comment: (NOTE) SARS-CoV-2 target nucleic acids are NOT DETECTED.  The SARS-CoV-2 RNA is generally detectable in upper respiratory specimens during the acute phase of infection. The lowest concentration of SARS-CoV-2 viral copies this assay can detect is 138 copies/mL. A negative result does not preclude SARS-Cov-2 infection and should not be used as the sole basis for treatment or other patient management decisions. A negative result may occur with  improper specimen collection/handling,  submission of specimen other than nasopharyngeal swab, presence of viral mutation(s) within the areas targeted by this assay, and inadequate number of  viral copies(<138 copies/mL). A negative result must be combined with clinical observations, patient history, and epidemiological information. The expected result is Negative.  Fact Sheet for Patients:  EntrepreneurPulse.com.au  Fact Sheet for Healthcare Providers:  IncredibleEmployment.be  This test is no t yet approved or cleared by the [place] FDA and  has been authorized for detection and/or diagnosis of SARS-CoV-2 by FDA under an Emergency Use Authorization (EUA). This EUA will remain  in effect (meaning this test can be used) for the duration of the COVID-19 declaration under Section 564(b)(1) of the Act, 21 U.S.C.section 360bbb-3(b)(1), unless the authorization is terminated  or revoked sooner.       Influenza A by PCR NEGATIVE NEGATIVE Final   Influenza B by PCR NEGATIVE NEGATIVE Final    Comment: (NOTE) The Xpert Xpress SARS-CoV-2/FLU/RSV plus assay is intended as an aid in the diagnosis of influenza from Nasopharyngeal swab specimens and should not be used as a sole basis for treatment. Nasal washings and aspirates are unacceptable for Xpert Xpress SARS-CoV-2/FLU/RSV testing.  Fact Sheet for Patients: EntrepreneurPulse.com.au  Fact Sheet for Healthcare Providers: IncredibleEmployment.be  This test is not yet approved or cleared by the [place] FDA and has been authorized for detection and/or diagnosis of SARS-CoV-2 by FDA under an Emergency Use Authorization (EUA). This EUA will remain in effect (meaning this test can be used) for the duration of the COVID-19 declaration under Section 564(b)(1) of the Act, 21 U.S.C. section 360bbb-3(b)(1), unless the authorization is terminated or revoked.  Performed at [facility], [address] [place], [place] [address]   Culture, blood (routine x 2)     Status: None (Preliminary result)   Collection Time: 04/04/20  6:49 AM    Specimen: BLOOD  Result Value Ref Range Status   Specimen Description BLOOD LEFT [facility]  Final   Special Requests   Final    BOTTLES DRAWN AEROBIC AND ANAEROBIC Blood Culture adequate volume   Culture   Final    NO GROWTH 4 DAYS Performed at [facility], [address], [place], [place] [address]    Report Status PENDING  Incomplete  Culture, blood (routine x 2)     Status: None (Preliminary result)   Collection Time: 04/04/20  6:49 AM   Specimen: BLOOD  Result Value Ref Range Status   Specimen Description BLOOD RIGHT [facility]  Final   Special Requests   Final    BOTTLES DRAWN AEROBIC AND ANAEROBIC Blood Culture adequate volume   Culture   Final    NO GROWTH 4 DAYS Performed at [facility], [address] [place], [place] [address]    Report Status PENDING  Incomplete  Resp Panel by RT-PCR (Flu A&B, Covid) Nasopharyngeal Swab     Status: None   Collection Time: 04/04/20  6:49 AM   Specimen: Nasopharyngeal Swab; Nasopharyngeal(NP) swabs in vial transport medium  Result Value Ref Range Status   SARS Coronavirus 2 by RT PCR NEGATIVE NEGATIVE Final    Comment: (NOTE) SARS-CoV-2 target nucleic acids are NOT DETECTED.  The SARS-CoV-2 RNA is generally detectable in upper respiratory specimens during the acute phase of infection. The lowest concentration of SARS-CoV-2 viral copies this assay can detect is 138 copies/mL. A negative result does not preclude SARS-Cov-2 infection and should not be used as the sole basis for treatment or other patient  management decisions. A negative result may occur with  improper specimen collection/handling, submission of specimen other than nasopharyngeal swab, presence of viral mutation(s) within the areas targeted by this assay, and inadequate number of viral copies(<138 copies/mL). A negative result must be combined with clinical observations, patient history, and epidemiological information. The expected result is Negative.  Fact  Sheet for Patients:  EntrepreneurPulse.com.au  Fact Sheet for Healthcare Providers:  IncredibleEmployment.be  This test is no t yet approved or cleared by the [place] FDA and  has been authorized for detection and/or diagnosis of SARS-CoV-2 by FDA under an Emergency Use Authorization (EUA). This EUA will remain  in effect (meaning this test can be used) for the duration of the COVID-19 declaration under Section 564(b)(1) of the Act, 21 U.S.C.section 360bbb-3(b)(1), unless the authorization is terminated  or revoked sooner.       Influenza A by PCR NEGATIVE NEGATIVE Final   Influenza B by PCR NEGATIVE NEGATIVE Final    Comment: (NOTE) The Xpert Xpress SARS-CoV-2/FLU/RSV plus assay is intended as an aid in the diagnosis of influenza from Nasopharyngeal swab specimens and should not be used as a sole basis for treatment. Nasal washings and aspirates are unacceptable for Xpert Xpress SARS-CoV-2/FLU/RSV testing.  Fact Sheet for Patients: EntrepreneurPulse.com.au  Fact Sheet for Healthcare Providers: IncredibleEmployment.be  This test is not yet approved or cleared by the [place] FDA and has been authorized for detection and/or diagnosis of SARS-CoV-2 by FDA under an Emergency Use Authorization (EUA). This EUA will remain in effect (meaning this test can be used) for the duration of the COVID-19 declaration under Section 564(b)(1) of the Act, 21 U.S.C. section 360bbb-3(b)(1), unless the authorization is terminated or revoked.  Performed at [facility], [address] [place], [place] [address]   Urine Culture     Status: Abnormal   Collection Time: 04/04/20 12:00 PM   Specimen: Urine, Random  Result Value Ref Range Status   Specimen Description   Final    URINE, RANDOM Performed at [facility], [address], [place], [place] [address]    Special Requests   Final     NONE Performed at [facility], [address] [place], [place] [address]    Culture >=100,000 COLONIES/mL ESCHERICHIA COLI (A)  Final   Report Status 04/07/2020 FINAL  Final   Organism ID, Bacteria ESCHERICHIA COLI (A)  Final      Susceptibility   Escherichia coli - MIC*    AMPICILLIN >=32 RESISTANT Resistant     CEFAZOLIN >=64 RESISTANT Resistant     CEFEPIME <=0.12 SENSITIVE Sensitive     CEFTRIAXONE 1 SENSITIVE Sensitive     CIPROFLOXACIN <=0.25 SENSITIVE Sensitive     GENTAMICIN <=1 SENSITIVE Sensitive     IMIPENEM <=0.25 SENSITIVE Sensitive     NITROFURANTOIN <=16 SENSITIVE Sensitive     TRIMETH/SULFA <=20 SENSITIVE Sensitive     AMPICILLIN/SULBACTAM >=32 RESISTANT Resistant     PIP/TAZO 8 SENSITIVE Sensitive     * >=100,000 COLONIES/mL ESCHERICHIA COLI      Imaging Studies   No results found.   Medications   Scheduled Meds: . amLODipine  10 mg Oral Daily  . aspirin EC  81 mg Oral Daily  . calcium-vitamin D  1 tablet Oral Daily  . fluticasone  1 spray Each Nare BID  . ipratropium  2 spray Each Nare TID  . multivitamin with minerals  1 tablet Oral Daily  . omega-3 acid ethyl esters  1,000 mg Oral Daily  . potassium chloride  40 mEq Oral Once  . QUEtiapine  50 mg Oral QHS   Continuous Infusions: . sodium chloride 100 mL/hr at 04/06/20 1533  . cefTRIAXone (ROCEPHIN)  IV         LOS: 4 days    Time spent: 25 minutes with greater than 50% spent in coordination of care and direct patient contact    Ezekiel Slocumb, DO [facility]  04/08/2020, 5:52 PM    If 7PM-7AM, please contact night-coverage. How to contact the [facility] Attending or Consulting provider [place] or covering provider during after hours [place], for this patient?    1. Check the care team in [facility] and look for a) attending/consulting [facility] provider listed and b) the [facility] team listed 2. Log into [email or web address] and use [facility]'s universal password to access. If you do not have  the password, please contact the hospital operator. 3. Locate the [facility] provider you are looking for under [facility] and page to a number that you can be directly reached. 4. If you still have difficulty reaching the provider, please page the [facility] (Director on Call) for the Hospitalists listed on amion for assistance.

## 2020-04-08 NOTE — Evaluation (Signed)
Occupational Therapy Evaluation Patient Details Name: Travis Craig MRN: [id number] DOB: [birth year] Today's Date: 04/08/2020    History of Present Illness Olive Motyka is a 79 y.o. male with medical history significant of hypertension, hyperlipidemia, hard of hearing, dementia, hyponatremia, varicose vein, who presents with fall, diarrhea. 12/20 CT: right orbital floor fracture, Nondisplaced right pterygoid plate fractures,  Displaced right anterior 6 rib fracture.   Clinical Impression   Mr Hedgepath was seen for OT/PT co-evaluation this date, seen together 2/2 poor command following and intermittent agitation. Prior to hospital admission, pt was Independent in mobility and ADLs. Pt lives c wife in home c 5 STE and B rails, reports she will be having surgery soon and require round the clock care. Pt presents to acute OT demonstrating impaired ADL performance, functional cognition, and functional mobility 2/2 decreased LB access, functional strength/balance deficits, poor insight into deficits, and decreased safety awareness.   Pt currently requires MAX A x2 sup<>sit. Tolerates ~20 min sitting EOB c MIN A and intermittent CGA (decreases to MAX A for posterior lean when pt unable to attend to task). MAX A don B socks seated EOB. MAX A tooth bruhsing adn self-drinking seated EOB - assist for sequencing hand over hand. MIN A x2 + HHA for ADL t/f - unable to progress to steps, B legs bucking and potential orthostatics. BP elevated in sitting however unable to determine if pt contracting RUE during BP reading as pt has difficulty following commands. Pt would benefit from skilled OT to address noted impairments and functional limitations (see below for any additional details) in order to maximize safety and independence while minimizing falls risk and caregiver burden. Upon hospital discharge, recommend STR to maximize pt safety and return to PLOF.     Follow Up Recommendations  SNF     Equipment Recommendations  3 in 1 bedside commode;Hospital bed    Recommendations for Other Services       Precautions / Restrictions Precautions Precautions: Fall (Delirium pcns) Restrictions Weight Bearing Restrictions: No      Mobility Bed Mobility Overal bed mobility: Needs Assistance Bed Mobility: Rolling;Supine to Sit;Sit to Supine Rolling: Mod assist   Supine to sit: Max assist;+2 for physical assistance;HOB elevated Sit to supine: Max assist;+2 for physical assistance        Transfers Overall transfer level: Needs assistance Equipment used: 2 person hand held assist Transfers: Sit to/from Stand Sit to Stand: Min assist;+2 physical assistance;+2 safety/equipment              Balance Overall balance assessment: Needs assistance Sitting-balance support: No upper extremity supported;Feet supported Sitting balance-Leahy Scale: Fair Sitting balance - Comments: Intermittently CGA (decreases to MAX A when pt unable to attend to task Postural control: Posterior lean   Standing balance-Leahy Scale: Poor Standing balance comment: posterior lean                           ADL either performed or assessed with clinical judgement   ADL Overall ADL's : Needs assistance/impaired                                       General ADL Comments: MAX A don B socks seated EOB. MAX A tooth bruhsing adn self-drinking seated EOB - assist for sequencing hand over hand. MIN A x2 + HHA for ADL t/f - unable to progress to  steps, potential orthostatics                  Pertinent Vitals/Pain Pain Assessment: Faces Faces Pain Scale: Hurts little more Pain Location: ribs Pain Descriptors / Indicators: Dull;Discomfort Pain Intervention(s): Repositioned     Hand Dominance Right   Extremity/Trunk Assessment Upper Extremity Assessment Upper Extremity Assessment: Generalized weakness;Difficult to assess due to impaired cognition   Lower Extremity  Assessment Lower Extremity Assessment: Generalized weakness;Difficult to assess due to impaired cognition       Communication Communication Communication: Expressive difficulties   Cognition Arousal/Alertness: Awake/alert Behavior During Therapy: WFL for tasks assessed/performed Overall Cognitive Status: Impaired/Different from baseline Area of Impairment: Orientation;Following commands;Safety/judgement;Problem solving                 Orientation Level: Disoriented to;Place;Time;Situation     Following Commands: Follows one step commands inconsistently Safety/Judgement: Decreased awareness of deficits;Decreased awareness of safety   Problem Solving: Slow processing;Decreased initiation;Difficulty sequencing;Requires verbal cues;Requires tactile cues     General Comments  BP elevated in standing however unable to determine if pt contracting RUE during BP reading as pt has difficulty following commands    Exercises Exercises: Other exercises Other Exercises Other Exercises: Pt and family educated re: OT role, DME recs, d/c recs, falls prevention, ECS Other Exercises: LBD, grooming, sup<>sit, sit<>stand, sitting/standing balance/tolerance   Shoulder Instructions      Home Living Family/patient expects to be discharged to:: Private residence Living Arrangements: Spouse/significant other Available Help at Discharge: Family;Available PRN/intermittently Type of Home: House Home Access: Stairs to enter [employer] of Steps: 5 Entrance Stairs-Rails: Can reach both Home Layout: One level     Bathroom Shower/Tub: Walk-in shower;Tub/shower unit             Additional Comments: wife reports she will have surgery soon      Prior Functioning/Environment Level of Independence: Independent        Comments: Wife reports they exercise daily        OT Problem List: Decreased strength;Decreased range of motion;Decreased activity tolerance;Impaired balance  (sitting and/or standing);Decreased safety awareness;Decreased cognition;Impaired vision/perception      OT Treatment/Interventions: Self-care/ADL training;Therapeutic exercise;Energy conservation;DME and/or AE instruction;Therapeutic activities;Patient/family education;Balance training;Cognitive remediation/compensation    OT Goals(Current goals can be found in the care plan section) Acute Rehab OT Goals Patient Stated Goal: To return home OT Goal Formulation: With patient/family Time For Goal Achievement: 04/22/20 Potential to Achieve Goals: Fair ADL Goals Pt Will Perform Grooming: sitting;with set-up;with supervision Pt Will Perform Lower Body Dressing: with min assist;sit to/from stand Pt Will Transfer to Toilet: with min guard assist;stand pivot transfer;bedside commode (c LRAD PRN)  OT Frequency: Min 2X/week   Barriers to D/C: Decreased caregiver support          Co-evaluation PT/OT/SLP Co-Evaluation/Treatment: Yes Reason for Co-Treatment: Necessary to address cognition/behavior during functional activity;To address functional/ADL transfers;For patient/therapist safety PT goals addressed during session: Mobility/safety with mobility;Balance OT goals addressed during session: ADL's and self-care      AM-PAC OT "6 Clicks" Daily Activity     Outcome Measure Help from another person eating meals?: A Lot Help from another person taking care of personal grooming?: A Lot Help from another person toileting, which includes using toliet, bedpan, or urinal?: A Lot Help from another person bathing (including washing, rinsing, drying)?: A Lot Help from another person to put on and taking off regular upper body clothing?: A Lot Help from another person to put on and taking off regular  lower body clothing?: A Lot 6 Click Score: 12   End of Session Equipment Utilized During Treatment: Gait belt Nurse Communication: Mobility status  Activity Tolerance: Patient tolerated treatment  well;Treatment limited secondary to agitation Patient left: in bed;with call bell/phone within reach;with bed alarm set;with nursing/sitter in room;with family/visitor present  OT Visit Diagnosis: Other abnormalities of gait and mobility (R26.89)                Time: [id number] OT Time Calculation (min): 45 min Charges:  OT General Charges $OT Visit: 1 Visit OT Evaluation $OT Eval High Complexity: 1 High OT Treatments $Self Care/Home Management : 23-37 mins  Dessie Coma, M.S. OTR/L  04/08/20, 12:04 PM  ascom [phone]
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
## 2020-04-08 NOTE — Evaluation (Signed)
Physical Therapy Evaluation Patient Details Name: Travis Craig MRN: [id number] DOB: 09/21/1940 Today's Date: 04/08/2020   History of Present Illness  Travis Craig is a 79 y.o. male with medical history significant of hypertension, hyperlipidemia, hard of hearing, dementia, hyponatremia, varicose vein, who presents with fall, diarrhea. 12/20 CT: right orbital floor fracture, Nondisplaced right pterygoid plate fractures,  Displaced right anterior 6 rib fracture.      Clinical Impression  Pt in supine position upon arrival to room.  Wife present throughout session.  Pt was seen for PT/OT co-evaluation this session due to poor command following and intermittent agitation.  According to wife, pt was independent in all mobility and ADL's prior to hospitalization.  Due to fall, fractures of the skull, and bruising around the oribital region, pt has difficulty opening his eyes.  Pt requires MaxA x2 for coming seated EOB from supine position.  Pt was able to tolerate ~20 minutes of sitting EOB with maxA at times to prevent posterior lean when pt was unable to follow tasks.  When pt was focused on task at hand, pt able to sit upright with CGA.  Pt able to perform dynamic seated tasks with assistance from OT for brushing of teeth and drinking water.  Pt was able to perform STS x2 with hand-held minA x2 from both therapist.  Once upright pt able to tolerate ~4 minutes of standing in place before knees began to buckle.  BP was attempted to be taken throughout session but due to pt contraction of arm muscles during seated and standing, results were not clear.  Pt does demonstrate some symptoms of orthostatics and should be monitored in future sessions.  Pt will benefit from skilled PT intervention to increase independence and safety with basic mobility in preparation for discharge to the venue listed below.       Follow Up Recommendations SNF    Equipment Recommendations  Rolling walker with 5"  wheels;3in1 (PT)    Recommendations for Other Services       Precautions / Restrictions Precautions Precautions: Fall (Delirium pcns) Restrictions Weight Bearing Restrictions: No      Mobility  Bed Mobility Overal bed mobility: Needs Assistance Bed Mobility: Rolling;Supine to Sit;Sit to Supine Rolling: Mod assist   Supine to sit: Max assist;+2 for physical assistance;HOB elevated Sit to supine: Max assist;+2 for physical assistance        Transfers Overall transfer level: Needs assistance Equipment used: 2 person hand held assist Transfers: Sit to/from Stand Sit to Stand: Min assist;+2 physical assistance;+2 safety/equipment            Ambulation/Gait                Stairs            Wheelchair Mobility    Modified Rankin (Stroke Patients Only)       Balance Overall balance assessment: Needs assistance Sitting-balance support: No upper extremity supported;Feet supported Sitting balance-Leahy Scale: Fair Sitting balance - Comments: Intermittently CGA (decreases to MAX A when pt unable to attend to task Postural control: Posterior lean   Standing balance-Leahy Scale: Poor Standing balance comment: posterior lean                             Pertinent Vitals/Pain Pain Assessment: Faces Faces Pain Scale: Hurts little more Pain Location: ribs Pain Descriptors / Indicators: Dull;Discomfort Pain Intervention(s): Repositioned    Home Living Family/patient expects to be discharged to:: Private  residence Living Arrangements: Spouse/significant other Available Help at Discharge: Family;Available PRN/intermittently Type of Home: House Home Access: Stairs to enter Entrance Stairs-Rails: Can reach both Entrance Stairs-Number of Steps: 5 Home Layout: One level   Additional Comments: wife reports she will have surgery soon    Prior Function Level of Independence: Independent         Comments: Wife reports they exercise daily      Hand Dominance   Dominant Hand: Right    Extremity/Trunk Assessment   Upper Extremity Assessment Upper Extremity Assessment: Generalized weakness;Difficult to assess due to impaired cognition    Lower Extremity Assessment Lower Extremity Assessment: Generalized weakness;Difficult to assess due to impaired cognition       Communication   Communication: Expressive difficulties  Cognition Arousal/Alertness: Awake/alert Behavior During Therapy: WFL for tasks assessed/performed Overall Cognitive Status: Impaired/Different from baseline Area of Impairment: Orientation;Following commands;Safety/judgement;Problem solving                 Orientation Level: Disoriented to;Place;Time;Situation     Following Commands: Follows one step commands inconsistently Safety/Judgement: Decreased awareness of deficits;Decreased awareness of safety   Problem Solving: Slow processing;Decreased initiation;Difficulty sequencing;Requires verbal cues;Requires tactile cues        General Comments General comments (skin integrity, edema, etc.): BP elevated in standing however unable to determine if pt contracting RUE during BP reading as pt has difficulty following commands    Exercises Other Exercises Other Exercises: Pt and significant other educated on PT role and services provided during hospital stay. Other Exercises: Seated EOB balance, standing balance, tolerance   Assessment/Plan    PT Assessment Patient needs continued PT services  PT Problem List Decreased strength;Decreased activity tolerance;Decreased balance;Decreased mobility;Decreased cognition;Decreased knowledge of use of DME;Decreased safety awareness       PT Treatment Interventions DME instruction;Gait training;Stair training;Functional mobility training;Therapeutic activities;Therapeutic exercise;Balance training;Patient/family education    PT Goals (Current goals can be found in the Care Plan section)  Acute  Rehab PT Goals Patient Stated Goal: To return home PT Goal Formulation: With patient/family Time For Goal Achievement: 04/22/20 Potential to Achieve Goals: Fair    Frequency Min 2X/week   Barriers to discharge Decreased caregiver support +2 assist for standing, navigation of steps to get in home, decreased caregiver support with wife about to have surgical procedure.    Co-evaluation PT/OT/SLP Co-Evaluation/Treatment: Yes Reason for Co-Treatment: Necessary to address cognition/behavior during functional activity;To address functional/ADL transfers PT goals addressed during session: Mobility/safety with mobility;Balance OT goals addressed during session: ADL's and self-care       AM-PAC PT "6 Clicks" Mobility  Outcome Measure Help needed turning from your back to your side while in a flat bed without using bedrails?: A Lot Help needed moving from lying on your back to sitting on the side of a flat bed without using bedrails?: A Lot Help needed moving to and from a bed to a chair (including a wheelchair)?: A Lot Help needed standing up from a chair using your arms (e.g., wheelchair or bedside chair)?: A Lot Help needed to walk in hospital room?: Total Help needed climbing 3-5 steps with a railing? : Total 6 Click Score: 10    End of Session Equipment Utilized During Treatment: Gait belt (Keeping gait belt low due to rib fracture.) Activity Tolerance: Patient tolerated treatment well;Patient limited by fatigue Patient left: in bed;with call bell/phone within reach;with bed alarm set;with family/visitor present Nurse Communication: Mobility status PT Visit Diagnosis: Unsteadiness on feet (R26.81);Other abnormalities of gait and  mobility (R26.89);Repeated falls (R29.6);Muscle weakness (generalized) (M62.81);History of falling (Z91.81);Difficulty in walking, not elsewhere classified (R26.2);Pain Pain - part of body:  (Ribs)    Time: [id number] PT Time Calculation (min) (ACUTE ONLY):  44 min   Charges:   PT Evaluation $PT Eval Low Complexity: 1 Low PT Treatments $Therapeutic Exercise: 8-22 mins        Gwenlyn Saran, PT, DPT 04/08/20, 1:45 PM

## 2020-04-09 DIAGNOSIS — T796XXA Traumatic ischemia of muscle, initial encounter: Secondary | ICD-10-CM | POA: Diagnosis not present

## 2020-04-09 DIAGNOSIS — R945 Abnormal results of liver function studies: Secondary | ICD-10-CM | POA: Diagnosis not present

## 2020-04-09 DIAGNOSIS — G9341 Metabolic encephalopathy: Secondary | ICD-10-CM | POA: Diagnosis not present

## 2020-04-09 DIAGNOSIS — N3001 Acute cystitis with hematuria: Secondary | ICD-10-CM | POA: Diagnosis not present

## 2020-04-09 LAB — COMPREHENSIVE METABOLIC PANEL
ALT: 70 U/L — ABNORMAL HIGH (ref 0–44)
AST: 134 U/L — ABNORMAL HIGH (ref 15–41)
Albumin: 3 g/dL — ABNORMAL LOW (ref 3.5–5.0)
Alkaline Phosphatase: 53 U/L (ref 38–126)
Anion gap: 10 (ref 5–15)
BUN: 25 mg/dL — ABNORMAL HIGH (ref 8–23)
CO2: 24 mmol/L (ref 22–32)
Calcium: 8.7 mg/dL — ABNORMAL LOW (ref 8.9–10.3)
Chloride: 115 mmol/L — ABNORMAL HIGH (ref 98–111)
Creatinine, Ser: 0.72 mg/dL (ref 0.61–1.24)
GFR, Estimated: >60 mL/min (ref >60)
Glucose, Bld: 110 mg/dL — ABNORMAL HIGH (ref 70–99)
Potassium: 3.1 mmol/L — ABNORMAL LOW (ref 3.5–5.1)
Sodium: 149 mmol/L — ABNORMAL HIGH (ref 135–145)
Total Bilirubin: 2 mg/dL — ABNORMAL HIGH (ref 0.3–1.2)
Total Protein: 6.3 g/dL — ABNORMAL LOW (ref 6.5–8.1)

## 2020-04-09 LAB — CULTURE, BLOOD (ROUTINE X 2)
Culture: NO GROWTH
Culture: NO GROWTH
Special Requests: ADEQUATE
Special Requests: ADEQUATE

## 2020-04-09 LAB — AMMONIA: Ammonia: 13 umol/L (ref 9–35)

## 2020-04-09 LAB — CK: Total CK: 492 U/L — ABNORMAL HIGH (ref 49–397)

## 2020-04-09 MED ORDER — LORAZEPAM 2 MG/ML IJ SOLN
1.0000 mg | Freq: Once | INTRAMUSCULAR | Status: AC
  Administered 2020-04-09: 06:00:00 1 mg via INTRAVENOUS
  Filled 2020-04-09: qty 1

## 2020-04-09 MED ORDER — LIDOCAINE 5 % EX PTCH
1.0000 | MEDICATED_PATCH | CUTANEOUS | Status: DC
  Administered 2020-04-10 – 2020-04-12 (×3): 1 via TRANSDERMAL
  Filled 2020-04-09 (×5): qty 1

## 2020-04-09 MED ORDER — OLANZAPINE 10 MG IM SOLR
5.0000 mg | Freq: Once | INTRAMUSCULAR | Status: AC
  Administered 2020-04-09: 14:00:00 5 mg via INTRAMUSCULAR
  Filled 2020-04-09: qty 10

## 2020-04-09 MED ORDER — FENTANYL CITRATE (PF) 100 MCG/2ML IJ SOLN
25.0000 ug | Freq: Once | INTRAMUSCULAR | Status: AC
Start: 2020-04-09 — End: 2020-04-09
  Administered 2020-04-09: 04:00:00 25 ug via INTRAVENOUS
  Filled 2020-04-09: qty 2

## 2020-04-09 MED ORDER — LORAZEPAM 2 MG/ML IJ SOLN
1.0000 mg | Freq: Once | INTRAMUSCULAR | Status: AC
  Administered 2020-04-09: 15:00:00 1 mg via INTRAVENOUS
  Filled 2020-04-09: qty 1

## 2020-04-09 MED ORDER — HALOPERIDOL LACTATE 5 MG/ML IJ SOLN
2.0000 mg | Freq: Once | INTRAMUSCULAR | Status: AC
  Administered 2020-04-09: 10:00:00 2 mg via INTRAVENOUS
  Filled 2020-04-09: qty 1

## 2020-04-09 MED ORDER — HYDRALAZINE HCL 20 MG/ML IJ SOLN
5.0000 mg | INTRAMUSCULAR | Status: DC | PRN
  Administered 2020-04-09 – 2020-04-10 (×3): 5 mg via INTRAVENOUS
  Filled 2020-04-09 (×4): qty 1

## 2020-04-09 MED ORDER — SODIUM CHLORIDE 0.9 % IV SOLN: INTRAVENOUS | Status: DC

## 2020-04-09 MED ORDER — ACETAMINOPHEN 650 MG RE SUPP
650.0000 mg | Freq: Four times a day (QID) | RECTAL | Status: DC
  Filled 2020-04-09: qty 1

## 2020-04-09 MED ORDER — KCL IN DEXTROSE-NACL 40-5-0.45 MEQ/L-%-% IV SOLN
INTRAVENOUS | Status: DC
  Filled 2020-04-09 (×14): qty 1000
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
## 2020-04-09 NOTE — Progress Notes (Signed)
PROGRESS NOTE    Travis Craig   IHK:[id number]  DOB: 05/24/1940  PCP: Olin Hauser, DO    DOA: 04/04/2020 LOS: 5   Brief Narrative   Travis Craig is a 79 y.o. male with medical history significant of hypertension, hyperlipidemia, hard of hearing, dementia, hyponatremia. Patient presented after a fall suffering facial and right-sided rib fractures and found to have rhabdomyolysis and UTI.  Patient's dementia is fairly mild at baseline, per wife's report, but is having some behavioral disturbances with agitation requiring sitter and/or wife to be at bedside for patient's safety.  He was seen by ophthalmology and ENT with no surgical intervention needed for facial fractures.  Urine culture growing E. Coli.  Patient on empiric Rocephin pending susceptibilities.  On IV fluids for rhabdomyolysis.     Assessment & Plan   Principal Problem:   Elevated troponin Active Problems:   Essential hypertension   Mixed hyperlipidemia   Hyponatremia   Fall at home, initial encounter   Rhabdomyolysis   Hypokalemia   Thrombocytopenia (HCC)   Diarrhea   Right rib fracture   Lactic acidosis   Fever   Orbital floor fracture (HCC)   Pterygoid plate fracture (HCC)   Abnormal LFTs   Acute metabolic encephalopathy   UTI (urinary tract infection)   Fall -with significant trauma and multiple facial and rib fractures as outlined below.  Fall was likely due to underlying infection.    Orbital floor fracture Pterygoid plate fracture Right rib fracture Patient evaluated by ophthalmology and ENT.  No need for surgical intervention. -ENT recommendations: Soft diet, avoid blowing nose -Oxycodone as needed -Consider scheduled Tylenol if LFTs improving Patient has not complained of any pain per wife.  Traumatic rhabdomyolysis -secondary to fall.   CK is slowly rending downward with IV fluids. Continue normal saline at 100 cc/h.  Monitor CMP and CK daily. Expect CK  normal in next couple days.  Urinary tract infection -present on admission.  Urine culture growing E. coli which is sensitive to Rocephin.  Continue Rocephin -to complete 5 to 7 days pending clinical course. Follow-up blood cultures.  On IV fluids for rhabdo.    Fever secondary to UTI -resolved.  Management as above  Acute metabolic encephalopathy superimposed on  Dementia with behavioral disturbance in setting of infection and hospital environment.   Patient does better with wife present at bedside.   --one-to-one sitter for safety, especially when wife not present ----Delirium precautions:     -Lights and TV off, minimize interruptions at night    -Blinds open and lights on during day    -Glasses/hearing aid with patient    -Frequent reorientation    -PT/OT when able    -Avoid sedation medications/Beers list medications --Trial of Seroquel at bedtime, reduce dose, hold if pt sedated -unable to eat take n.p.o. at this time however --IV Haldol okay as needed --Wife felt Zyprexa worked well when used once previously.  Will give small dose this afternoon for worsening agitation again.  Hypernatremia due to poor PO intake with ongoing delirium.  Changed fluids to D5-1/2NS and potassium.  Hypokalemia also likely from poor PO intake.  Replacing with KCl additive and IV fluids as  Elevated AST/ALT/bilirubin- in setting of rhabdo.  Right upper quadrant ultrasound unremarkable, showed hepatic steatosis, no focal hepatic lesions and postcholecystectomy sequelae.  Elevated troponin - attributed to demand ischemia in setting of fall, infection.  Cardiology was consulted. EKG and Echo were unremarkable.  Due to dementia, not good candidate  for ischemic evaluation.  Cardiology has signed off.  Patient is free of chest pain.  Essential Hpertension - on amlodipine  Hyperlipidemia - statin on hold for now.  Thrombocytopenia - suspect reactive.  Monitor CBC.  Hypokalemia - replaced,  monitor BMP and replace as needed  Diarrhea - resolved.  Lactic acidosis - resolved with IV hydration   DVT prophylaxis: SCDs Start: 04/05/20 0101   Diet:  Diet Orders (From admission, onward)    Start     Ordered   04/08/20 1233  Diet NPO time specified  Diet effective now        04/08/20 1233            Code Status: Full Code    Subjective 04/09/20    Patient seen with wife at bedside.  Nursing attempting patient care but patient agitated and combative, swinging at staff.  Wife assisting with keeping him calm but needs medication again.  Got haldol and Ativan overnight.     Disposition Plan & Communication   Status is: Inpatient  Remains inpatient appropriate because:IV treatments appropriate due to intensity of illness or inability to take PO.  Inadequate PO intake with ongoing acute delirium with agitation.  Still on IV fluids for rhabdomyolysis.    Dispo: The patient is from: Home              Anticipated d/c is to: Home              Anticipated d/c date is: 2 to 3 days              Patient currently is not medically stable to d/c.   Family Communication: Wife is at bedside on rounds today   Consults, Procedures, Significant Events   Consultants:   Ophthalmology  ENT  Cardiology  Neurosurgery  Procedures:   Laceration repair  Antimicrobials:  Anti-infectives (From admission, onward)   Start     Dose/Rate Route Frequency Ordered Stop   04/08/20 1800  cefTRIAXone (ROCEPHIN) 1 g in sodium chloride 0.9 % 100 mL IVPB        1 g 200 mL/hr over 30 Minutes Intravenous Every 24 hours 04/08/20 1316 04/11/20 1759   04/04/20 1800  cefTRIAXone (ROCEPHIN) 1 g in sodium chloride 0.9 % 100 mL IVPB  Status:  Discontinued        1 g 200 mL/hr over 30 Minutes Intravenous Every 24 hours 04/04/20 1652 04/08/20 1316        Objective   Vitals:   04/09/20 0535 04/09/20 0722 04/09/20 0757 04/09/20 1140  BP: (!) 175/80 (!) 145/79 (!) 163/97 (!) 164/98  Pulse:  70 (!) 54 (!) 58 64  Resp:   18 20  Temp:   98.9 F (37.2 C) 98 F (36.7 C)  TempSrc:   Oral Oral  SpO2:   97% 98%  Weight:      Height:        Intake/Output Summary (Last 24 hours) at 04/09/2020 1413 Last data filed at 04/09/2020 1100 Gross per 24 hour  Intake --  Output 1700 ml  Net -1700 ml   Filed Weights   04/04/20 0621 04/05/20 0332 04/08/20 0328  Weight: 83.5 kg 79.4 kg 83.5 kg    Physical Exam:  General exam: awake eyes closed, restless, no acute distress HEENT: bilateral periorbital ecchymosis is beginning to resolve at the margins, eyes closed, right upper forehead laceration intact Respiratory system: CTAB, normal respiratory effort, on room air. Cardiovascular system: normal S1/S2, RRR,  no pedal edema.   Gastrointestinal system: soft, nontender Central nervous system: Unable to fully evaluate as patient is agitated and not following commands, moves all extremities   Labs   Data Reviewed: I have personally reviewed following labs and imaging studies  CBC: Recent Labs  Lab 04/04/20 0649 04/05/20 0454 04/06/20 0501 04/07/20 0516  WBC 7.7 9.4 10.7* 8.4  NEUTROABS 6.8  --   --   --   HGB 12.5* 12.1* 12.0* 12.8*  HCT 34.4* 34.4* 33.0* 34.6*  MCV 97.5 98.9 97.6 96.4  PLT 106* 104* 124* 0000000   Basic Metabolic Panel: Recent Labs  Lab 04/04/20 0955 04/04/20 1200 04/05/20 0454 04/06/20 0501 04/07/20 0516 04/08/20 0810 04/09/20 0538  NA  --    < > 135 141 139 144 149*  K  --    < > 3.7 3.1* 2.8* 3.4* 3.1*  CL  --    < > 104 108 105 110 115*  CO2  --    < > 22 26 26 25 24   GLUCOSE  --    < > 107* 126* 118* 117* 110*  BUN  --    < > 18 18 13 23  25*  CREATININE  --    < > 0.79 0.60* 0.60* 0.64 0.72  CALCIUM  --    < > 8.5* 8.2* 8.3* 8.7* 8.7*  MG 1.8  --   --   --   --   --   --    < > = values in this interval not displayed.   GFR: Estimated Creatinine Clearance: 78.4 mL/min (by C-G formula based on SCr of 0.72 mg/dL). Liver Function  Tests: Recent Labs  Lab 04/05/20 0454 04/06/20 0501 04/07/20 0516 04/08/20 0810 04/09/20 0538  AST 187* 186* 185* 152* 134*  ALT 47* 58* 67* 71* 70*  ALKPHOS 43 49 56 57 53  BILITOT 1.8* 1.9* 1.9* 2.0* 2.0*  PROT 6.1* 5.9* 6.1* 6.4* 6.3*  ALBUMIN 3.1* 2.9* 3.0* 3.1* 3.0*   No results for input(s): LIPASE, AMYLASE in the last 168 hours. Recent Labs  Lab 04/04/20 2034 04/09/20 0538  AMMONIA 14 13   Coagulation Profile: Recent Labs  Lab 04/04/20 1200  INR 1.1   Cardiac Enzymes: Recent Labs  Lab 04/05/20 0454 04/06/20 0501 04/07/20 0516 04/08/20 0810 04/09/20 0538  CKTOTAL 1,693* 1,001* 984* 625* 492*   BNP (last 3 results) No results for input(s): PROBNP in the last 8760 hours. HbA1C: No results for input(s): HGBA1C in the last 72 hours. CBG: No results for input(s): GLUCAP in the last 168 hours. Lipid Profile: No results for input(s): CHOL, HDL, LDLCALC, TRIG, CHOLHDL, LDLDIRECT in the last 72 hours. Thyroid Function Tests: No results for input(s): TSH, T4TOTAL, FREET4, T3FREE, THYROIDAB in the last 72 hours. Anemia Panel: No results for input(s): VITAMINB12, FOLATE, FERRITIN, TIBC, IRON, RETICCTPCT in the last 72 hours. Sepsis Labs: Recent Labs  Lab 04/04/20 0649 04/04/20 1200  LATICACIDVEN 3.3* 1.5    Recent Results (from the past 240 hour(s))  Resp Panel by RT-PCR (Flu A&B, Covid) Nasopharyngeal Swab     Status: None   Collection Time: 04/02/20  6:06 AM   Specimen: Nasopharyngeal Swab; Nasopharyngeal(NP) swabs in vial transport medium  Result Value Ref Range Status   SARS Coronavirus 2 by RT PCR NEGATIVE NEGATIVE Final    Comment: (NOTE) SARS-CoV-2 target nucleic acids are NOT DETECTED.  The SARS-CoV-2 RNA is generally detectable in upper respiratory specimens during the acute phase of infection.  The lowest concentration of SARS-CoV-2 viral copies this assay can detect is 138 copies/mL. A negative result does not preclude SARS-Cov-2 infection  and should not be used as the sole basis for treatment or other patient management decisions. A negative result may occur with  improper specimen collection/handling, submission of specimen other than nasopharyngeal swab, presence of viral mutation(s) within the areas targeted by this assay, and inadequate number of viral copies(<138 copies/mL). A negative result must be combined with clinical observations, patient history, and epidemiological information. The expected result is Negative.  Fact Sheet for Patients:  EntrepreneurPulse.com.au  Fact Sheet for Healthcare Providers:  IncredibleEmployment.be  This test is no t yet approved or cleared by the [place] FDA and  has been authorized for detection and/or diagnosis of SARS-CoV-2 by FDA under an Emergency Use Authorization (EUA). This EUA will remain  in effect (meaning this test can be used) for the duration of the COVID-19 declaration under Section 564(b)(1) of the Act, 21 U.S.C.section 360bbb-3(b)(1), unless the authorization is terminated  or revoked sooner.       Influenza A by PCR NEGATIVE NEGATIVE Final   Influenza B by PCR NEGATIVE NEGATIVE Final    Comment: (NOTE) The Xpert Xpress SARS-CoV-2/FLU/RSV plus assay is intended as an aid in the diagnosis of influenza from Nasopharyngeal swab specimens and should not be used as a sole basis for treatment. Nasal washings and aspirates are unacceptable for Xpert Xpress SARS-CoV-2/FLU/RSV testing.  Fact Sheet for Patients: EntrepreneurPulse.com.au  Fact Sheet for Healthcare Providers: IncredibleEmployment.be  This test is not yet approved or cleared by the [place] FDA and has been authorized for detection and/or diagnosis of SARS-CoV-2 by FDA under an Emergency Use Authorization (EUA). This EUA will remain in effect (meaning this test can be used) for the duration of the COVID-19 declaration  under Section 564(b)(1) of the Act, 21 U.S.C. section 360bbb-3(b)(1), unless the authorization is terminated or revoked.  Performed at [facility], [address] [place], [place] [address]   Culture, blood (routine x 2)     Status: None   Collection Time: 04/04/20  6:49 AM   Specimen: BLOOD  Result Value Ref Range Status   Specimen Description BLOOD LEFT [facility]  Final   Special Requests   Final    BOTTLES DRAWN AEROBIC AND ANAEROBIC Blood Culture adequate volume   Culture   Final    NO GROWTH 5 DAYS Performed at [facility], [address] [place], [place] [address]    Report Status 04/09/2020 FINAL  Final  Culture, blood (routine x 2)     Status: None   Collection Time: 04/04/20  6:49 AM   Specimen: BLOOD  Result Value Ref Range Status   Specimen Description BLOOD RIGHT [facility]  Final   Special Requests   Final    BOTTLES DRAWN AEROBIC AND ANAEROBIC Blood Culture adequate volume   Culture   Final    NO GROWTH 5 DAYS Performed at [facility], [address] [place], [place] [address]    Report Status 04/09/2020 FINAL  Final  Resp Panel by RT-PCR (Flu A&B, Covid) Nasopharyngeal Swab     Status: None   Collection Time: 04/04/20  6:49 AM   Specimen: Nasopharyngeal Swab; Nasopharyngeal(NP) swabs in vial transport medium  Result Value Ref Range Status   SARS Coronavirus 2 by RT PCR NEGATIVE NEGATIVE Final    Comment: (NOTE) SARS-CoV-2 target nucleic acids are NOT DETECTED.  The SARS-CoV-2 RNA is generally detectable  in upper respiratory specimens during the acute phase of infection. The lowest concentration of SARS-CoV-2 viral copies this assay can detect is 138 copies/mL. A negative result does not preclude SARS-Cov-2 infection and should not be used as the sole basis for treatment or other patient management decisions. A negative result may occur with  improper specimen collection/handling, submission of specimen other than nasopharyngeal swab,  presence of viral mutation(s) within the areas targeted by this assay, and inadequate number of viral copies(<138 copies/mL). A negative result must be combined with clinical observations, patient history, and epidemiological information. The expected result is Negative.  Fact Sheet for Patients:  EntrepreneurPulse.com.au  Fact Sheet for Healthcare Providers:  IncredibleEmployment.be  This test is no t yet approved or cleared by the [place] FDA and  has been authorized for detection and/or diagnosis of SARS-CoV-2 by FDA under an Emergency Use Authorization (EUA). This EUA will remain  in effect (meaning this test can be used) for the duration of the COVID-19 declaration under Section 564(b)(1) of the Act, 21 U.S.C.section 360bbb-3(b)(1), unless the authorization is terminated  or revoked sooner.       Influenza A by PCR NEGATIVE NEGATIVE Final   Influenza B by PCR NEGATIVE NEGATIVE Final    Comment: (NOTE) The Xpert Xpress SARS-CoV-2/FLU/RSV plus assay is intended as an aid in the diagnosis of influenza from Nasopharyngeal swab specimens and should not be used as a sole basis for treatment. Nasal washings and aspirates are unacceptable for Xpert Xpress SARS-CoV-2/FLU/RSV testing.  Fact Sheet for Patients: EntrepreneurPulse.com.au  Fact Sheet for Healthcare Providers: IncredibleEmployment.be  This test is not yet approved or cleared by the [place] FDA and has been authorized for detection and/or diagnosis of SARS-CoV-2 by FDA under an Emergency Use Authorization (EUA). This EUA will remain in effect (meaning this test can be used) for the duration of the COVID-19 declaration under Section 564(b)(1) of the Act, 21 U.S.C. section 360bbb-3(b)(1), unless the authorization is terminated or revoked.  Performed at [facility], [address] [place], [place] [address]   Urine Culture      Status: Abnormal   Collection Time: 04/04/20 12:00 PM   Specimen: Urine, Random  Result Value Ref Range Status   Specimen Description   Final    URINE, RANDOM Performed at [facility], [address] [place], [place] [address]    Special Requests   Final    NONE Performed at [facility], [address] [place], [place] [address]    Culture >=100,000 COLONIES/mL ESCHERICHIA COLI (A)  Final   Report Status 04/07/2020 FINAL  Final   Organism ID, Bacteria ESCHERICHIA COLI (A)  Final      Susceptibility   Escherichia coli - MIC*    AMPICILLIN >=32 RESISTANT Resistant     CEFAZOLIN >=64 RESISTANT Resistant     CEFEPIME <=0.12 SENSITIVE Sensitive     CEFTRIAXONE 1 SENSITIVE Sensitive     CIPROFLOXACIN <=0.25 SENSITIVE Sensitive     GENTAMICIN <=1 SENSITIVE Sensitive     IMIPENEM <=0.25 SENSITIVE Sensitive     NITROFURANTOIN <=16 SENSITIVE Sensitive     TRIMETH/SULFA <=20 SENSITIVE Sensitive     AMPICILLIN/SULBACTAM >=32 RESISTANT Resistant     PIP/TAZO 8 SENSITIVE Sensitive     * >=100,000 COLONIES/mL ESCHERICHIA COLI      Imaging Studies   No results found.   Medications   Scheduled Meds: . amLODipine  10 mg Oral Daily  . aspirin EC  81 mg  Oral Daily  . calcium-vitamin D  1 tablet Oral Daily  . fluticasone  1 spray Each Nare BID  . ipratropium  2 spray Each Nare TID  . multivitamin with minerals  1 tablet Oral Daily  . omega-3 acid ethyl esters  1,000 mg Oral Daily  . potassium chloride  40 mEq Oral Once  . QUEtiapine  25 mg Oral QHS   Continuous Infusions: . cefTRIAXone (ROCEPHIN)  IV 1 g (04/08/20 1823)  . dextrose 5 % and 0.45 % NaCl with KCl 40 mEq/L 100 mL/hr at 04/09/20 1056       LOS: 5 days    Time spent: 25 minutes with greater than 50% spent in coordination of care and direct patient contact    Ezekiel Slocumb, DO [facility]  04/09/2020, 2:13 PM    If 7PM-7AM, please contact night-coverage. How to contact  the [facility] Attending or Consulting provider [place] or covering provider during after hours [place], for this patient?    1. Check the care team in [facility] and look for a) attending/consulting [facility] provider listed and b) the [facility] team listed 2. Log into [email or web address] and use [place]'s universal password to access. If you do not have the password, please contact the hospital operator. 3. Locate the [facility] provider you are looking for under [facility] and page to a number that you can be directly reached. 4. If you still have difficulty reaching the provider, please page the [facility] (Director on Call) for the Hospitalists listed on amion for assistance.

## 2020-04-09 NOTE — Plan of Care (Signed)
  Problem: Coping: Goal: Level of anxiety will decrease Outcome: Progressing   Problem: Pain Managment: Goal: General experience of comfort will improve Outcome: Progressing   Problem: Safety: Goal: Ability to remain free from injury will improve Outcome: Progressing   

## 2020-04-09 NOTE — Progress Notes (Signed)
Unable to complete NIHSS. Patient is not able to follow commands. Note made in charting to document this situation.

## 2020-04-10 DIAGNOSIS — T796XXD Traumatic ischemia of muscle, subsequent encounter: Secondary | ICD-10-CM | POA: Diagnosis not present

## 2020-04-10 DIAGNOSIS — G9341 Metabolic encephalopathy: Secondary | ICD-10-CM | POA: Diagnosis not present

## 2020-04-10 DIAGNOSIS — N3001 Acute cystitis with hematuria: Secondary | ICD-10-CM | POA: Diagnosis not present

## 2020-04-10 LAB — COMPREHENSIVE METABOLIC PANEL
ALT: 73 U/L — ABNORMAL HIGH (ref 0–44)
AST: 125 U/L — ABNORMAL HIGH (ref 15–41)
Albumin: 3.2 g/dL — ABNORMAL LOW (ref 3.5–5.0)
Alkaline Phosphatase: 52 U/L (ref 38–126)
Anion gap: 10 (ref 5–15)
BUN: 18 mg/dL (ref 8–23)
CO2: 25 mmol/L (ref 22–32)
Calcium: 8.4 mg/dL — ABNORMAL LOW (ref 8.9–10.3)
Chloride: 113 mmol/L — ABNORMAL HIGH (ref 98–111)
Creatinine, Ser: 0.59 mg/dL — ABNORMAL LOW (ref 0.61–1.24)
GFR, Estimated: >60 mL/min (ref >60)
Glucose, Bld: 131 mg/dL — ABNORMAL HIGH (ref 70–99)
Potassium: 2.7 mmol/L — CL (ref 3.5–5.1)
Sodium: 148 mmol/L — ABNORMAL HIGH (ref 135–145)
Total Bilirubin: 1.6 mg/dL — ABNORMAL HIGH (ref 0.3–1.2)
Total Protein: 6.7 g/dL (ref 6.5–8.1)

## 2020-04-10 LAB — CK: Total CK: 442 U/L — ABNORMAL HIGH (ref 49–397)

## 2020-04-10 MED ORDER — LORAZEPAM 2 MG/ML IJ SOLN
0.5000 mg | INTRAMUSCULAR | Status: DC | PRN
  Administered 2020-04-10 – 2020-04-11 (×3): 0.5 mg via INTRAVENOUS
  Filled 2020-04-10 (×3): qty 1

## 2020-04-10 MED ORDER — HALOPERIDOL LACTATE 5 MG/ML IJ SOLN
3.0000 mg | Freq: Four times a day (QID) | INTRAMUSCULAR | Status: DC | PRN
  Administered 2020-04-10 – 2020-04-13 (×5): 3 mg via INTRAVENOUS
  Filled 2020-04-10 (×5): qty 1

## 2020-04-10 MED ORDER — POTASSIUM CHLORIDE 10 MEQ/100ML IV SOLN
10.0000 meq | INTRAVENOUS | Status: DC
  Administered 2020-04-10: 08:00:00 10 meq via INTRAVENOUS
  Filled 2020-04-10 (×2): qty 100

## 2020-04-10 MED ORDER — POTASSIUM CHLORIDE 20 MEQ PO PACK: 40.0000 meq | PACK | Freq: Two times a day (BID) | ORAL | Status: DC

## 2020-04-10 MED ORDER — HYDRALAZINE HCL 20 MG/ML IJ SOLN
10.0000 mg | INTRAMUSCULAR | Status: DC | PRN
  Administered 2020-04-11 – 2020-04-13 (×2): 10 mg via INTRAVENOUS
  Filled 2020-04-10 (×2): qty 1

## 2020-04-10 MED ORDER — HALOPERIDOL LACTATE 5 MG/ML IJ SOLN
5.0000 mg | Freq: Once | INTRAMUSCULAR | Status: AC
  Administered 2020-04-10: 05:00:00 5 mg via INTRAVENOUS
  Filled 2020-04-10: qty 1

## 2020-04-10 MED ORDER — POTASSIUM CHLORIDE 10 MEQ/100ML IV SOLN
10.0000 meq | INTRAVENOUS | Status: AC
  Administered 2020-04-10 (×4): 10 meq via INTRAVENOUS
  Filled 2020-04-10 (×4): qty 100

## 2020-04-10 MED ORDER — ACETAMINOPHEN 10 MG/ML IV SOLN
1000.0000 mg | Freq: Three times a day (TID) | INTRAVENOUS | Status: AC
  Administered 2020-04-10 (×3): 1000 mg via INTRAVENOUS
  Filled 2020-04-10 (×4): qty 100

## 2020-04-10 NOTE — Progress Notes (Signed)
SLP Cancellation Note  Patient Details Name: Travis Craig MRN: [id number] DOB: [birth year]   Cancelled treatment:       Reason Eval/Treat Not Completed: Fatigue/lethargy limiting ability to participate;Medical issues which prohibited therapy;Patient's level of consciousness;Patient not medically ready (chart reviewed; consulted NSG/MD; met w/ pt)  Pt remains too lethargic and poorly responsive/arousable for any safe, meaningful assessment to be completed. Pt appears at a High risk for aspiration of any oral intake currently. MD/NSG updated. Discussed this, and recommendations for frequent oral care, w/ Wife at bedside. ST services will f/u tomorrow w/ ongoing assessment.      Orinda Kenner, MS, CCC-SLP Speech Language Pathologist Rehab Services [phone] [facility] 04/10/2020, 10:05 AM

## 2020-04-10 NOTE — Progress Notes (Signed)
PROGRESS NOTE    Travis Craig   YWV:[id number]  DOB: 07-Mar-1941  PCP: Olin Hauser, DO    DOA: 04/04/2020 LOS: 6   Brief Narrative   Travis Craig is a 79 y.o. male with medical history significant of hypertension, hyperlipidemia, hard of hearing, dementia, hyponatremia. Patient presented after a fall suffering facial and right-sided rib fractures and found to have rhabdomyolysis and UTI.  Patient's dementia is fairly mild at baseline, per wife's report, but is having some behavioral disturbances with agitation requiring sitter and/or wife to be at bedside for patient's safety.  He was seen by ophthalmology and ENT with no surgical intervention needed for facial fractures.  Urine culture growing E. Coli.  Patient on empiric Rocephin pending susceptibilities.  On IV fluids for rhabdomyolysis.     Assessment & Plan   Principal Problem:   Elevated troponin Active Problems:   Essential hypertension   Mixed hyperlipidemia   Hyponatremia   Fall at home, initial encounter   Rhabdomyolysis   Hypokalemia   Thrombocytopenia (HCC)   Diarrhea   Right rib fracture   Lactic acidosis   Fever   Orbital floor fracture (HCC)   Pterygoid plate fracture (HCC)   Abnormal LFTs   Acute metabolic encephalopathy   UTI (urinary tract infection)   Fall -with significant trauma and multiple facial and rib fractures as outlined below.  Fall was likely due to underlying infection.    Orbital floor fracture Pterygoid plate fracture Right rib fracture Patient evaluated by ophthalmology and ENT.  No need for surgical intervention. -ENT recommendations: Soft diet, avoid blowing nose -start scheduled IV Tylenol as pain could be contributing to behavioral disturbances    Traumatic rhabdomyolysis -secondary to fall.  CK trend:1693>>1001>>984>>...442 today. CK is slowly rending downward with IV fluids. Continue IV fluids per.  Monitor BMP.   Monitor CMP and CK  daily.   Urinary tract infection -present on admission.  Urine culture growing E. coli which is sensitive to Rocephin.  Continue Rocephin - to complete 5 to 7 days pending clinical course. Follow-up blood cultures.  On IV fluids for rhabdo.    Fever secondary to UTI -resolved.  Management as above  Acute metabolic encephalopathy superimposed on  Dementia with behavioral disturbance in setting of infection and hospital environment.   Patient does better with wife present at bedside.   --one-to-one sitter for safety, especially when wife not present ----Delirium precautions:     -Lights and TV off, minimize interruptions at night    -Blinds open and lights on during day    -Glasses/hearing aid with patient    -Frequent reorientation    -PT/OT when able    -Avoid sedation medications/Beers list medications --Trial of Seroquel at bedtime once he is taking p.o. again --Ativan as needed for agitation (per wife's request feels it is more helpful than antipsychotics) --IV Haldol or IM Zyprexa okay to use as needed   Hypernatremia due to poor PO intake with ongoing delirium.  Changed fluids to D5-1/2NS and potassium.  Hypokalemia also likely from poor PO intake.  Replacing with KCl additive and IV fluids and piggybacks.  Monitor BMP  Elevated AST/ALT/bilirubin- in setting of rhabdo.  Right upper quadrant ultrasound unremarkable, showed hepatic steatosis, no focal hepatic lesions and postcholecystectomy sequelae.  Elevated troponin - attributed to demand ischemia in setting of fall, infection.  Cardiology was consulted. EKG and Echo were unremarkable.  Due to dementia, not good candidate for ischemic evaluation.  Cardiology has signed off.  Patient is free of chest pain.  Essential Hpertension - on amlodipine  Hyperlipidemia - statin on hold for now.  Thrombocytopenia -resolved.  Suspect due to infection.  Monitor CBC.  Hypokalemia - replaced, monitor BMP and replace as  needed  Diarrhea - resolved.  Lactic acidosis - resolved with IV hydration   DVT prophylaxis: SCDs Start: 04/05/20 0101   Diet:  Diet Orders (From admission, onward)    Start     Ordered   04/08/20 1233  Diet NPO time specified  Diet effective now        04/08/20 1233            Code Status: Full Code    Subjective 04/10/20    Patient seen with wife at bedside.  Patient's wife has been at bedside most of this admission except overnight, and has witnessed his response to different medications used to control his behaviors.  She feels the Ativan has been the most beneficial.  Patient is resting comfortably and appears in no distress.   Disposition Plan & Communication   Status is: Inpatient  Remains inpatient appropriate because:IV treatments appropriate due to intensity of illness or inability to take PO.  Inadequate PO intake with ongoing acute delirium with agitation.  Still on IV fluids for rhabdomyolysis.    Dispo: The patient is from: Home              Anticipated d/c is to: Home              Anticipated d/c date is: 2 to 3 days              Patient currently is not medically stable to d/c.   Family Communication: Wife is at bedside on rounds today   Consults, Procedures, Significant Events   Consultants:   Ophthalmology  ENT  Cardiology  Neurosurgery  Procedures:   Laceration repair  Antimicrobials:  Anti-infectives (From admission, onward)   Start     Dose/Rate Route Frequency Ordered Stop   04/08/20 1800  cefTRIAXone (ROCEPHIN) 1 g in sodium chloride 0.9 % 100 mL IVPB        1 g 200 mL/hr over 30 Minutes Intravenous Every 24 hours 04/08/20 1316 04/11/20 1759   04/04/20 1800  cefTRIAXone (ROCEPHIN) 1 g in sodium chloride 0.9 % 100 mL IVPB  Status:  Discontinued        1 g 200 mL/hr over 30 Minutes Intravenous Every 24 hours 04/04/20 1652 04/08/20 1316        Objective   Vitals:   04/09/20 2310 04/10/20 0340 04/10/20 0900 04/10/20 1152   BP: (!) 155/100 (!) 167/100 (!) 154/99 (!) 150/117  Pulse: 64 73 (!) 58 (!) 59  Resp: 17 18 20 18   Temp: 98 F (36.7 C) 99.1 F (37.3 C) 98.7 F (37.1 C) 98.4 F (36.9 C)  TempSrc: Oral Oral Oral Oral  SpO2: 98% 98% 99%   Weight:  80.3 kg    Height:        Intake/Output Summary (Last 24 hours) at 04/10/2020 1528 Last data filed at 04/10/2020 0400 Gross per 24 hour  Intake 409.97 ml  Output 2200 ml  Net -1790.03 ml   Filed Weights   04/05/20 0332 04/08/20 0328 04/10/20 0340  Weight: 79.4 kg 83.5 kg 80.3 kg    Physical Exam:  General exam: sleeping comfortably, restless, no acute distress HEENT: bilateral periorbital ecchymosis resolving well, eyes closed, right upper forehead laceration intact Respiratory system: CTAB, normal  respiratory effort, on room air. Cardiovascular system: normal S1/S2, RRR, no pedal edema.   Gastrointestinal system: soft, nontender    Labs   Data Reviewed: I have personally reviewed following labs and imaging studies  CBC: Recent Labs  Lab 04/04/20 0649 04/05/20 0454 04/06/20 0501 04/07/20 0516  WBC 7.7 9.4 10.7* 8.4  NEUTROABS 6.8  --   --   --   HGB 12.5* 12.1* 12.0* 12.8*  HCT 34.4* 34.4* 33.0* 34.6*  MCV 97.5 98.9 97.6 96.4  PLT 106* 104* 124* 0000000   Basic Metabolic Panel: Recent Labs  Lab 04/04/20 0955 04/04/20 1200 04/06/20 0501 04/07/20 0516 04/08/20 0810 04/09/20 0538 04/10/20 0417  NA  --    < > 141 139 144 149* 148*  K  --    < > 3.1* 2.8* 3.4* 3.1* 2.7*  CL  --    < > 108 105 110 115* 113*  CO2  --    < > 26 26 25 24 25   GLUCOSE  --    < > 126* 118* 117* 110* 131*  BUN  --    < > 18 13 23  25* 18  CREATININE  --    < > 0.60* 0.60* 0.64 0.72 0.59*  CALCIUM  --    < > 8.2* 8.3* 8.7* 8.7* 8.4*  MG 1.8  --   --   --   --   --   --    < > = values in this interval not displayed.   GFR: Estimated Creatinine Clearance: 78.4 mL/min (A) (by C-G formula based on SCr of 0.59 mg/dL (L)). Liver Function Tests: Recent  Labs  Lab 04/06/20 0501 04/07/20 0516 04/08/20 0810 04/09/20 0538 04/10/20 0417  AST 186* 185* 152* 134* 125*  ALT 58* 67* 71* 70* 73*  ALKPHOS 49 56 57 53 52  BILITOT 1.9* 1.9* 2.0* 2.0* 1.6*  PROT 5.9* 6.1* 6.4* 6.3* 6.7  ALBUMIN 2.9* 3.0* 3.1* 3.0* 3.2*   No results for input(s): LIPASE, AMYLASE in the last 168 hours. Recent Labs  Lab 04/04/20 2034 04/09/20 0538  AMMONIA 14 13   Coagulation Profile: Recent Labs  Lab 04/04/20 1200  INR 1.1   Cardiac Enzymes: Recent Labs  Lab 04/06/20 0501 04/07/20 0516 04/08/20 0810 04/09/20 0538 04/10/20 0417  CKTOTAL 1,001* 984* 625* 492* 442*   BNP (last 3 results) No results for input(s): PROBNP in the last 8760 hours. HbA1C: No results for input(s): HGBA1C in the last 72 hours. CBG: No results for input(s): GLUCAP in the last 168 hours. Lipid Profile: No results for input(s): CHOL, HDL, LDLCALC, TRIG, CHOLHDL, LDLDIRECT in the last 72 hours. Thyroid Function Tests: No results for input(s): TSH, T4TOTAL, FREET4, T3FREE, THYROIDAB in the last 72 hours. Anemia Panel: No results for input(s): VITAMINB12, FOLATE, FERRITIN, TIBC, IRON, RETICCTPCT in the last 72 hours. Sepsis Labs: Recent Labs  Lab 04/04/20 0649 04/04/20 1200  LATICACIDVEN 3.3* 1.5    Recent Results (from the past 240 hour(s))  Resp Panel by RT-PCR (Flu A&B, Covid) Nasopharyngeal Swab     Status: None   Collection Time: 04/02/20  6:06 AM   Specimen: Nasopharyngeal Swab; Nasopharyngeal(NP) swabs in vial transport medium  Result Value Ref Range Status   SARS Coronavirus 2 by RT PCR NEGATIVE NEGATIVE Final    Comment: (NOTE) SARS-CoV-2 target nucleic acids are NOT DETECTED.  The SARS-CoV-2 RNA is generally detectable in upper respiratory specimens during the acute phase of infection. The lowest concentration of SARS-CoV-2  viral copies this assay can detect is 138 copies/mL. A negative result does not preclude SARS-Cov-2 infection and should not be  used as the sole basis for treatment or other patient management decisions. A negative result may occur with  improper specimen collection/handling, submission of specimen other than nasopharyngeal swab, presence of viral mutation(s) within the areas targeted by this assay, and inadequate number of viral copies(<138 copies/mL). A negative result must be combined with clinical observations, patient history, and epidemiological information. The expected result is Negative.  Fact Sheet for Patients:  EntrepreneurPulse.com.au  Fact Sheet for Healthcare Providers:  IncredibleEmployment.be  This test is no t yet approved or cleared by the [place] FDA and  has been authorized for detection and/or diagnosis of SARS-CoV-2 by FDA under an Emergency Use Authorization (EUA). This EUA will remain  in effect (meaning this test can be used) for the duration of the COVID-19 declaration under Section 564(b)(1) of the Act, 21 U.S.C.section 360bbb-3(b)(1), unless the authorization is terminated  or revoked sooner.       Influenza A by PCR NEGATIVE NEGATIVE Final   Influenza B by PCR NEGATIVE NEGATIVE Final    Comment: (NOTE) The Xpert Xpress SARS-CoV-2/FLU/RSV plus assay is intended as an aid in the diagnosis of influenza from Nasopharyngeal swab specimens and should not be used as a sole basis for treatment. Nasal washings and aspirates are unacceptable for Xpert Xpress SARS-CoV-2/FLU/RSV testing.  Fact Sheet for Patients: EntrepreneurPulse.com.au  Fact Sheet for Healthcare Providers: IncredibleEmployment.be  This test is not yet approved or cleared by the [place] FDA and has been authorized for detection and/or diagnosis of SARS-CoV-2 by FDA under an Emergency Use Authorization (EUA). This EUA will remain in effect (meaning this test can be used) for the duration of the COVID-19 declaration under Section  564(b)(1) of the Act, 21 U.S.C. section 360bbb-3(b)(1), unless the authorization is terminated or revoked.  Performed at [facility], [address] [place], [place] [address]   Culture, blood (routine x 2)     Status: None   Collection Time: 04/04/20  6:49 AM   Specimen: BLOOD  Result Value Ref Range Status   Specimen Description BLOOD LEFT [facility]  Final   Special Requests   Final    BOTTLES DRAWN AEROBIC AND ANAEROBIC Blood Culture adequate volume   Culture   Final    NO GROWTH 5 DAYS Performed at [facility], [address], [place], [place] [address]    Report Status 04/09/2020 FINAL  Final  Culture, blood (routine x 2)     Status: None   Collection Time: 04/04/20  6:49 AM   Specimen: BLOOD  Result Value Ref Range Status   Specimen Description BLOOD RIGHT [facility]  Final   Special Requests   Final    BOTTLES DRAWN AEROBIC AND ANAEROBIC Blood Culture adequate volume   Culture   Final    NO GROWTH 5 DAYS Performed at [facility], [address] [place],  [address]    Report Status 04/09/2020 FINAL  Final  Resp Panel by RT-PCR (Flu A&B, Covid) Nasopharyngeal Swab     Status: None   Collection Time: 04/04/20  6:49 AM   Specimen: Nasopharyngeal Swab; Nasopharyngeal(NP) swabs in vial transport medium  Result Value Ref Range Status   SARS Coronavirus 2 by RT PCR NEGATIVE NEGATIVE Final    Comment: (NOTE) SARS-CoV-2 target nucleic acids are NOT DETECTED.  The SARS-CoV-2 RNA is generally detectable in upper respiratory specimens during  the acute phase of infection. The lowest concentration of SARS-CoV-2 viral copies this assay can detect is 138 copies/mL. A negative result does not preclude SARS-Cov-2 infection and should not be used as the sole basis for treatment or other patient management decisions. A negative result may occur with  improper specimen collection/handling, submission of specimen other than nasopharyngeal swab, presence of  viral mutation(s) within the areas targeted by this assay, and inadequate number of viral copies(<138 copies/mL). A negative result must be combined with clinical observations, patient history, and epidemiological information. The expected result is Negative.  Fact Sheet for Patients:  EntrepreneurPulse.com.au  Fact Sheet for Healthcare Providers:  IncredibleEmployment.be  This test is no t yet approved or cleared by the [place] FDA and  has been authorized for detection and/or diagnosis of SARS-CoV-2 by FDA under an Emergency Use Authorization (EUA). This EUA will remain  in effect (meaning this test can be used) for the duration of the COVID-19 declaration under Section 564(b)(1) of the Act, 21 U.S.C.section 360bbb-3(b)(1), unless the authorization is terminated  or revoked sooner.       Influenza A by PCR NEGATIVE NEGATIVE Final   Influenza B by PCR NEGATIVE NEGATIVE Final    Comment: (NOTE) The Xpert Xpress SARS-CoV-2/FLU/RSV plus assay is intended as an aid in the diagnosis of influenza from Nasopharyngeal swab specimens and should not be used as a sole basis for treatment. Nasal washings and aspirates are unacceptable for Xpert Xpress SARS-CoV-2/FLU/RSV testing.  Fact Sheet for Patients: EntrepreneurPulse.com.au  Fact Sheet for Healthcare Providers: IncredibleEmployment.be  This test is not yet approved or cleared by the [place] FDA and has been authorized for detection and/or diagnosis of SARS-CoV-2 by FDA under an Emergency Use Authorization (EUA). This EUA will remain in effect (meaning this test can be used) for the duration of the COVID-19 declaration under Section 564(b)(1) of the Act, 21 U.S.C. section 360bbb-3(b)(1), unless the authorization is terminated or revoked.  Performed at [facility], [address] [place], [place] [address]   Urine Culture     Status:  Abnormal   Collection Time: 04/04/20 12:00 PM   Specimen: Urine, Random  Result Value Ref Range Status   Specimen Description   Final    URINE, RANDOM Performed at [facility], [address] [place], [place] [address]    Special Requests   Final    NONE Performed at  [facility], [address] [place], [place] [address]    Culture >=100,000 COLONIES/mL ESCHERICHIA COLI (A)  Final   Report Status 04/07/2020 FINAL  Final   Organism ID, Bacteria ESCHERICHIA COLI (A)  Final      Susceptibility   Escherichia coli - MIC*    AMPICILLIN >=32 RESISTANT Resistant     CEFAZOLIN >=64 RESISTANT Resistant     CEFEPIME <=0.12 SENSITIVE Sensitive     CEFTRIAXONE 1 SENSITIVE Sensitive     CIPROFLOXACIN <=0.25 SENSITIVE Sensitive     GENTAMICIN <=1 SENSITIVE Sensitive     IMIPENEM <=0.25 SENSITIVE Sensitive     NITROFURANTOIN <=16 SENSITIVE Sensitive     TRIMETH/SULFA <=20 SENSITIVE Sensitive     AMPICILLIN/SULBACTAM >=32 RESISTANT Resistant     PIP/TAZO 8 SENSITIVE Sensitive     * >=100,000 COLONIES/mL ESCHERICHIA COLI      Imaging Studies   No results found.   Medications   Scheduled Meds: . calcium-vitamin D  1 tablet Oral Daily  . fluticasone  1 spray Each Nare BID  .  ipratropium  2 spray Each Nare TID  . lidocaine  1 patch Transdermal Q24H  . QUEtiapine  25 mg Oral QHS   Continuous Infusions: . acetaminophen 1,000 mg (04/10/20 1021)  . cefTRIAXone (ROCEPHIN)  IV Stopped (04/09/20 1752)  . dextrose 5 % and 0.45 % NaCl with KCl 40 mEq/L 100 mL/hr at 04/10/20 0818       LOS: 6 days    Time spent: 25 minutes with greater than 50% spent in coordination of care and direct patient contact    Ezekiel Slocumb, DO [facility]  04/10/2020, 3:28 PM    If 7PM-7AM, please contact night-coverage. How to contact the [facility] Attending or Consulting provider [place] or covering provider during after hours [place], for this patient?    1. Check the  care team in [facility] and look for a) attending/consulting [facility] provider listed and b) the [facility] team listed 2. Log into [email or web address] and use 's universal password to access. If you do not have the password, please contact the hospital operator. 3. Locate the [facility] provider you are looking for under [facility] and page to a number that you can be directly reached. 4. If you still have difficulty reaching the provider, please page the [facility] (Director on Call) for the Hospitalists listed on amion for assistance.

## 2020-04-11 DIAGNOSIS — I10 Essential (primary) hypertension: Secondary | ICD-10-CM | POA: Diagnosis not present

## 2020-04-11 DIAGNOSIS — Z7189 Other specified counseling: Secondary | ICD-10-CM

## 2020-04-11 DIAGNOSIS — N3001 Acute cystitis with hematuria: Secondary | ICD-10-CM | POA: Diagnosis not present

## 2020-04-11 DIAGNOSIS — T796XXD Traumatic ischemia of muscle, subsequent encounter: Secondary | ICD-10-CM | POA: Diagnosis not present

## 2020-04-11 DIAGNOSIS — E876 Hypokalemia: Secondary | ICD-10-CM | POA: Diagnosis not present

## 2020-04-11 DIAGNOSIS — Z515 Encounter for palliative care: Secondary | ICD-10-CM | POA: Diagnosis not present

## 2020-04-11 DIAGNOSIS — R778 Other specified abnormalities of plasma proteins: Secondary | ICD-10-CM | POA: Diagnosis not present

## 2020-04-11 LAB — COMPREHENSIVE METABOLIC PANEL
ALT: 61 U/L — ABNORMAL HIGH (ref 0–44)
AST: 96 U/L — ABNORMAL HIGH (ref 15–41)
Albumin: 2.9 g/dL — ABNORMAL LOW (ref 3.5–5.0)
Alkaline Phosphatase: 58 U/L (ref 38–126)
Anion gap: 6 (ref 5–15)
BUN: 17 mg/dL (ref 8–23)
CO2: 24 mmol/L (ref 22–32)
Calcium: 8.5 mg/dL — ABNORMAL LOW (ref 8.9–10.3)
Chloride: 114 mmol/L — ABNORMAL HIGH (ref 98–111)
Creatinine, Ser: 0.64 mg/dL (ref 0.61–1.24)
GFR, Estimated: >60 mL/min (ref >60)
Glucose, Bld: 117 mg/dL — ABNORMAL HIGH (ref 70–99)
Potassium: 3.4 mmol/L — ABNORMAL LOW (ref 3.5–5.1)
Sodium: 144 mmol/L (ref 135–145)
Total Bilirubin: 1.3 mg/dL — ABNORMAL HIGH (ref 0.3–1.2)
Total Protein: 5.9 g/dL — ABNORMAL LOW (ref 6.5–8.1)

## 2020-04-11 LAB — CK: Total CK: 313 U/L (ref 49–397)

## 2020-04-11 MED ORDER — OLANZAPINE 10 MG IM SOLR
10.0000 mg | Freq: Every day | INTRAMUSCULAR | Status: DC
  Administered 2020-04-12: 02:00:00 10 mg via INTRAMUSCULAR
  Filled 2020-04-11 (×4): qty 10
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
## 2020-04-11 NOTE — Care Management Important Message (Signed)
Important Message  Patient Details  Name: Travis Craig MRN: [id number] Date of Birth: Apr 30, 1940   Medicare Important Message Given:  Yes     Johnell Comings 04/11/2020, 12:18 PM
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
## 2020-04-11 NOTE — Consult Note (Signed)
Consultation Note Date: 04/11/2020   Patient Name: Travis Craig  DOB: 1940/10/24  MRN: [id number]  Age / Sex: 79 y.o., male  PCP: Smitty Cords, DO Referring Physician: Pennie Banter, DO  Reason for Consultation: Establishing goals of care  HPI/Patient Profile: Per patient's wife, patient has been having diarrhea for more than 4 days, and became dehydrated. His diarrhea has been improving, but still has several times of watery diarrhea each day.  Clinical Assessment and Goals of Care: Patient is resting in bed. Wife is at bedside. She states they have been married 16 years. She states they both have children from a previous marriage. He is retired from [employer].   She states functionally, he was doing well prior to 1 week ago. She states they went to exercise 2x/wk and he would walk every morning. She states he began having infrequent issues with decision making and was just diagnosed with dementia.   She discusses his agitation since being admitted here, and his sleepiness after the medication begins working.    We discussed his diagnosis, prognosis, GOC, EOL wishes disposition and options.  A detailed discussion was had today regarding advanced directives.  Concepts specific to code status, artifical feeding and hydration, IV antibiotics and rehospitalization were discussed.  The difference between an aggressive medical intervention path and a comfort care path was discussed.  Values and goals of care important to patient and family were attempted to be elicited.  Discussed limitations of medical interventions to prolong quality of life in some situations and discussed the concept of human mortality.  She states he has a living will and she will make decisions as needed, but states it is hard. She began to discuss decisions on code status, however patient became very agitated and  did not want to be touched, and wife was trying to keep him in the bed. Will return in the morning for further. Staff into the bedside. Communicated with attending regarding status and medications for agitation.       SUMMARY OF RECOMMENDATIONS   Will return tomorrow for further conversation with wife.        Prognosis:   Poor overall      Primary Diagnoses: Present on Admission: . Elevated troponin . Essential hypertension . Hyponatremia . Mixed hyperlipidemia . Rhabdomyolysis . Hypokalemia . Thrombocytopenia (HCC) . Diarrhea . Right rib fracture . Lactic acidosis . Fever . Orbital floor fracture (HCC) . Pterygoid plate fracture (HCC) . Abnormal LFTs . Acute metabolic encephalopathy . UTI (urinary tract infection)   I have reviewed the medical record, interviewed the patient and family, and examined the patient. The following aspects are pertinent.  Past Medical History:  Diagnosis Date  . Chicken pox   . Hearing loss   . Hyperlipidemia   . Hypertension   . Ulcer    Social History   Socioeconomic History  . Marital status: Married    Spouse name: Not on file  . Number of children: 2  . Years of education: 24  .  Highest education level: Not on file  Occupational History  . Occupation: Retired  Tobacco Use  . Smoking status: Never Smoker  . Smokeless tobacco: Never Used  Vaping Use  . Vaping Use: Never used  Substance and Sexual Activity  . Alcohol use: Yes    Alcohol/week: 2.0 standard drinks    Types: 2 Glasses of wine per week  . Drug use: No  . Sexual activity: Not Currently    Partners: Female  Other Topics Concern  . 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 Hypertension Father   . Alzheimer's disease Father    Scheduled Meds: . calcium-vitamin D  1 tablet Oral Daily  . ipratropium  2 spray Each Nare TID  . lidocaine  1 patch Transdermal Q24H  . QUEtiapine  25 mg Oral QHS   Continuous Infusions: . dextrose 5 % and 0.45 % NaCl with KCl 40 mEq/L 100 mL/hr at 04/10/20 1841   PRN Meds:.haloperidol lactate, hydrALAZINE, ibuprofen, LORazepam, ondansetron (ZOFRAN) IV Medications Prior to Admission:  Prior to Admission medications   Medication Sig Start Date End Date Taking? Authorizing Provider  albuterol (VENTOLIN HFA) 108 (90 Base) MCG/ACT inhaler Inhale 2 puffs into the lungs every 4 (four) hours as needed for wheezing or shortness of breath.   Yes [provider]  aspirin EC 81 MG tablet Take 81 mg by mouth daily.   Yes [provider]  atorvastatin (LIPITOR) 20 MG tablet TAKE 1 TABLET DAILY Patient taking differently: Take 20 mg by mouth daily. 10/20/19  Yes Karamalegos, Devonne Doughty, DO  calcium-vitamin D (OSCAL WITH D) 500-200 MG-UNIT tablet Take 1 tablet by mouth daily.   Yes [provider]  Coenzyme Q10 (COQ10) 100 MG CAPS Take 1 capsule by mouth daily.   Yes [provider]  fluticasone (FLONASE) 50 MCG/ACT nasal spray Place 1 spray into both nostrils 2 (two) times daily. 06/19/19  Yes [provider]  ipratropium (ATROVENT) 0.06 % nasal spray Place 2 sprays into both nostrils 4 (four) times daily as needed for rhinitis. 05/19/19  Yes Karamalegos, Devonne Doughty, DO  Multiple Vitamin (MULTI-VITAMIN DAILY PO) Take 1 tablet by mouth daily.   Yes [provider]  Omega-3 Fatty Acids (OMEGA-3 FISH OIL) 1200 MG CAPS Take 1 capsule by mouth as directed.   Yes [provider]  sertraline (ZOLOFT) 50 MG tablet Take 25  mg by mouth daily. 03/30/20  Yes [provider]   No Known Allergies Review of Systems  Unable to perform ROS   Physical Exam Pulmonary:     Effort: Pulmonary effort is normal.  Neurological:     Mental Status: He is alert.     Vital Signs: BP (!) 163/96 (BP Location: Right Leg)   Pulse 69   Temp 97.7 F (36.5 C)   Resp 18   Ht 5\' 11"  (1.803 m)   Wt 80.3 kg   SpO2 100%  BMI 24.69 kg/m  Pain Scale: 0-10   Pain Score: 0-No pain   SpO2: SpO2: 100 % O2 Device:SpO2: 100 % O2 Flow Rate: .   IO: Intake/output summary:   Intake/Output Summary (Last 24 hours) at 04/11/2020 1506 Last data filed at 04/11/2020 1010 Gross per 24 hour  Intake 2984.46 ml  Output 2200 ml  Net 784.46 ml    LBM: Last BM Date: 04/03/20 Baseline Weight: Weight: 83.5 kg Most recent weight: Weight: 80.3 kg     Palliative Assessment/Data:     Time In: 2:40 Time Out: 3:15 Time Total: 35 min Greater than 50%  of this time was spent counseling and coordinating care related to the above assessment and plan.  Signed by: Asencion Gowda, NP   Please contact Palliative Medicine Team phone at [phone] for questions and concerns.  For individual provider: See Shea Evans

## 2020-04-11 NOTE — Progress Notes (Signed)
Occupational Therapy Treatment Patient Details Name: Travis Craig MRN: [id number] DOB: 1940-10-17 Today's Date: 04/11/2020    History of present illness Travis Craig is a 79 y.o. male with medical history significant of hypertension, hyperlipidemia, hard of hearing, dementia, hyponatremia, varicose vein, who presents with fall, diarrhea. 12/20 CT: right orbital floor fracture, Nondisplaced right pterygoid plate fractures,  Displaced right anterior 6 rib fracture.   OT comments  Mr Houp was seen for OT/PT co-treatment on this date. Upon arrival to room wife at bedside reporting pt eager for mobility. Pt is rigid and defensive in bed, improved participation sitting EOB. Continues to have difficulty attending to and sequencing tasks. MIN A seated face washing - asssit for sitting balance, intermittently CGA. MAX A don B socks at bed level. Pt making good progress toward goals. Pt continues to benefit from skilled OT services to maximize return to PLOF and minimize risk of future falls, injury, caregiver burden, and readmission. Will continue to follow POC. Discharge recommendation remains appropriate.    Follow Up Recommendations  SNF    Equipment Recommendations  3 in 1 bedside commode;Hospital bed    Recommendations for Other Services      Precautions / Restrictions Precautions Precautions: Fall Restrictions Weight Bearing Restrictions: No       Mobility Bed Mobility Overal bed mobility: Needs Assistance Bed Mobility: Supine to Sit;Sit to Supine     Supine to sit: Max assist;+2 for physical assistance;HOB elevated Sit to supine: Max assist;+2 for physical assistance      Transfers Overall transfer level: Needs assistance Equipment used: 2 person hand held assist Transfers: Sit to/from Stand Sit to Stand: Min assist;+2 physical assistance;+2 safety/equipment              Balance Overall balance assessment: Needs assistance Sitting-balance support:  No upper extremity supported;Feet supported Sitting balance-Leahy Scale: Fair Sitting balance - Comments: Intermittently CGA (decreases to MAX A when pt unable to attend to task Postural control: Posterior lean Standing balance support: Bilateral upper extremity supported Standing balance-Leahy Scale: Poor                             ADL either performed or assessed with clinical judgement   ADL Overall ADL's : Needs assistance/impaired                                       General ADL Comments: MIN A seated face washing - asssit for sitting balance, intermittently CGA. MAX A don B socks at bed level               Cognition Arousal/Alertness: Awake/alert Behavior During Therapy: Impulsive Overall Cognitive Status: Impaired/Different from baseline Area of Impairment: Orientation;Following commands;Safety/judgement;Problem solving                 Orientation Level: Disoriented to;Place;Time;Situation     Following Commands: Follows one step commands inconsistently Safety/Judgement: Decreased awareness of deficits;Decreased awareness of safety   Problem Solving: Slow processing;Decreased initiation;Difficulty sequencing;Requires verbal cues;Requires tactile cues General Comments: Pt is rigid and defensive in bed, improves sitting EOB. Difficulty attending to and sequencing tasks        Exercises Exercises: Other exercises Other Exercises Other Exercises: Pt educated re: OT role, DME recs, d/c recs, falls prevention Other Exercises: Seated EOB balance, standing balance, tolerance, LBD, grooming, sup<>sit, sit<>stand  Pertinent Vitals/ Pain       Pain Assessment: Faces Faces Pain Scale: Hurts little more Pain Location: ribs Pain Descriptors / Indicators: Dull;Discomfort Pain Intervention(s): Limited activity within patient's tolerance;Repositioned         Frequency  Min 2X/week        Progress Toward Goals  OT  Goals(current goals can now be found in the care plan section)  Progress towards OT goals: Progressing toward goals  Acute Rehab OT Goals Patient Stated Goal: To return home OT Goal Formulation: With patient/family Time For Goal Achievement: 04/22/20 Potential to Achieve Goals: Fair ADL Goals Pt Will Perform Grooming: sitting;with set-up;with supervision Pt Will Perform Lower Body Dressing: with min assist;sit to/from stand Pt Will Transfer to Toilet: with min guard assist;stand pivot transfer;bedside commode  Plan Discharge plan remains appropriate;Frequency remains appropriate    Co-evaluation    PT/OT/SLP Co-Evaluation/Treatment: Yes Reason for Co-Treatment: Necessary to address cognition/behavior during functional activity;For patient/therapist safety;To address functional/ADL transfers PT goals addressed during session: Mobility/safety with mobility;Proper use of DME OT goals addressed during session: ADL's and self-care      AM-PAC OT "6 Clicks" Daily Activity     Outcome Measure   Help from another person eating meals?: A Little Help from another person taking care of personal grooming?: A Lot Help from another person toileting, which includes using toliet, bedpan, or urinal?: A Lot Help from another person bathing (including washing, rinsing, drying)?: A Lot Help from another person to put on and taking off regular upper body clothing?: A Little Help from another person to put on and taking off regular lower body clothing?: A Lot 6 Click Score: 14    End of Session    OT Visit Diagnosis: Other abnormalities of gait and mobility (R26.89)   Activity Tolerance Patient tolerated treatment well   Patient Left in bed;with call bell/phone within reach;with bed alarm set;with family/visitor present   Nurse Communication Mobility status        Time: [id number] OT Time Calculation (min): 31 min  Charges: OT General Charges $OT Visit: 1 Visit OT Treatments $Self  Care/Home Management : 8-22 mins  Dessie Coma, M.S. OTR/L  04/11/20, 12:30 PM  ascom [phone]

## 2020-04-11 NOTE — Progress Notes (Signed)
  Speech Language Pathology Treatment: Dysphagia  Patient Details Name: Travis Craig MRN: [id number] DOB: 06-18-1940 Today's Date: 04/11/2020 Time: 1020-1055 SLP Time Calculation (min) (ACUTE ONLY): 35.33 min  Assessment / Plan / Recommendation Clinical Impression  Wife at bedside hopeful that pt is alert enough to safely swallow. Apparently Pt is more alert today than when previously seen by ST. Today, Pt attempted to speak but speech was very difficult to understand. Oral care provided, Pt noted to breath heavily from his mouth. Given an ice chip, Pt needed cues to manipulate  The ice for a swallow. Delayed swallow, along with ,multiple swallows. Max cues needed for Pt to take a bite of applesauce from the soon. Delayed oral transit with moderate cues needed to continue to manipulate the applesauce for a swallow. Moderate amount of applesauce removed via oral suctioning. One Tsp of nectar thick liquid also attempted. Pt had ignificant productive coughing which continued for several minutes. Again oral suctioning provided to remove what Pt had coughed up. No more boluses given secondary to high risk of aspiration. Oral care provided again pripr to leaing Pt's room. Discussed risk of aspiration with wife who seemed to understand. Rec continue NPO for now. If Pt has oral care, he may have an ice chip to help relieve dry mouth and stimulate swallowing. ST to reassess daily as indicated in hopes of improvement.    HPI HPI: Pt is a 79 y.o. male with medical history significant of Dementia, hypertension, hyperlipidemia, hard of hearing, hyponatremia, varicose vein, who presents with fall, UTI.  Per patient's wife, patient has been having diarrhea for more than 4 days, initially he has diarrhea every few hours. His diarrhea has been improving, but still has several times of watery diarrhea each day.  Patient does not seem to have nausea, vomiting or abdominal pain per his wife.  He has fever, no chills.   His body temperature is 100.7 in ED.  Per his wife, patient has Dementia, but at his normal baseline patient is oriented most of the time . In the past several days, patient has been more confused.  Per his wife, he got out of bed and started getting ready for what he thought was work that morning (he is not currently employed).  He fell outside and was found lying in road by bystander.  Pt hit his head, and developed a large hematoma above right eye. He also has bruises in the right arm.  He has been increasingly confused since admit requiring Mitts and a Sitter currently.  Medications for calming agitation have been attempted per chrat notes/Wife; Seroquel at bedtime currently. Wife endorsed "sun-downing" at home.      SLP Plan  Continue with current plan of care;Other (Comment) (Continue with trials of Po's)       Recommendations  Diet recommendations: NPO Medication Administration: Via alternative means       ST to reassess daily         Oral Care Recommendations: Oral care QID;Staff/trained caregiver to provide oral care Follow up Recommendations: Skilled Nursing facility SLP Visit Diagnosis: Dysphagia, oropharyngeal phase (R13.12) Plan: Continue with current plan of care;Other (Comment) (Continue with trials of Po's)       GO                Eather Colas 04/11/2020, 10:55 AM

## 2020-04-12 DIAGNOSIS — T796XXD Traumatic ischemia of muscle, subsequent encounter: Secondary | ICD-10-CM | POA: Diagnosis not present

## 2020-04-12 DIAGNOSIS — R778 Other specified abnormalities of plasma proteins: Secondary | ICD-10-CM | POA: Diagnosis not present

## 2020-04-12 DIAGNOSIS — Z7189 Other specified counseling: Secondary | ICD-10-CM | POA: Diagnosis not present

## 2020-04-12 DIAGNOSIS — S2231XA Fracture of one rib, right side, initial encounter for closed fracture: Secondary | ICD-10-CM | POA: Diagnosis not present

## 2020-04-12 DIAGNOSIS — Z515 Encounter for palliative care: Secondary | ICD-10-CM | POA: Diagnosis not present

## 2020-04-12 LAB — CK: Total CK: 205 U/L (ref 49–397)

## 2020-04-12 LAB — COMPREHENSIVE METABOLIC PANEL
ALT: 54 U/L — ABNORMAL HIGH (ref 0–44)
AST: 79 U/L — ABNORMAL HIGH (ref 15–41)
Albumin: 2.8 g/dL — ABNORMAL LOW (ref 3.5–5.0)
Alkaline Phosphatase: 63 U/L (ref 38–126)
Anion gap: 7 (ref 5–15)
BUN: 23 mg/dL (ref 8–23)
CO2: 27 mmol/L (ref 22–32)
Calcium: 8.8 mg/dL — ABNORMAL LOW (ref 8.9–10.3)
Chloride: 114 mmol/L — ABNORMAL HIGH (ref 98–111)
Creatinine, Ser: 0.64 mg/dL (ref 0.61–1.24)
GFR, Estimated: >60 mL/min (ref >60)
Glucose, Bld: 121 mg/dL — ABNORMAL HIGH (ref 70–99)
Potassium: 3.6 mmol/L (ref 3.5–5.1)
Sodium: 148 mmol/L — ABNORMAL HIGH (ref 135–145)
Total Bilirubin: 1.7 mg/dL — ABNORMAL HIGH (ref 0.3–1.2)
Total Protein: 6.1 g/dL — ABNORMAL LOW (ref 6.5–8.1)

## 2020-04-12 MED ORDER — ZIPRASIDONE MESYLATE 20 MG IM SOLR
10.0000 mg | Freq: Every day | INTRAMUSCULAR | Status: DC
  Administered 2020-04-12 – 2020-04-15 (×4): 10 mg via INTRAMUSCULAR
  Filled 2020-04-12 (×5): qty 20

## 2020-04-12 NOTE — Progress Notes (Signed)
Daily Progress Note   Patient Name: Travis Craig       Date: 04/12/2020 DOB: 1940/09/08  Age: 79 y.o. MRN#: [id number] Attending Physician: Ezekiel Slocumb, DO Primary Care Physician: Olin Hauser, DO Admit Date: 04/04/2020  Reason for Consultation/Follow-up: Establishing goals of care  Subjective: Son is at bedside. He states his step mother has been updating him and accurately articulates his father's status. Patient is restless, confused, and has mittens in place. Son discusses acceptable QOL, and that his father would want to focus on comfort and dignity, and would not want to live in a situation where others had to do things for him such as hygiene. He further states he would desire his father be a DNR, and will talk with his stepmother regarding this. He states he has a sister, and goes to see her in a nursing facility. He states she is not in a place of being able to make decisions. Plans to check back in tomorrow. Patient's wife is unavailable today as she is seeking healthcare currently and has an impending surgery in the next couple of weeks that will require hospitalization. Plans for a meeting with wife and son at bedside on Friday at 10:00. He understands we will need to meet sooner if he declines. Will follow up tomorrow with wife regarding code status and any limits to care prior to Friday meeting.   Length of Stay: 8  Current Medications: Scheduled Meds:  . calcium-vitamin D  1 tablet Oral Daily  . ipratropium  2 spray Each Nare TID  . lidocaine  1 patch Transdermal Q24H  . OLANZapine  10 mg Intramuscular Q2000  . QUEtiapine  25 mg Oral QHS    Continuous Infusions: . dextrose 5 % and 0.45 % NaCl with KCl 40 mEq/L 100 mL/hr at 04/12/20 0857    PRN  Meds: haloperidol lactate, hydrALAZINE, ibuprofen, LORazepam, ondansetron (ZOFRAN) IV  Physical Exam Pulmonary:     Effort: Pulmonary effort is normal.  Neurological:     Mental Status: He is alert.             Vital Signs: BP (!) 160/85 (BP Location: Left Leg)   Pulse (!) 55   Temp 97.6 F (36.4 C) (Oral)   Resp 18   Ht 5\' 11"  (1.803 m)   Wt  80.3 kg   SpO2 100%   BMI 24.69 kg/m  SpO2: SpO2: 100 % O2 Device: O2 Device: Room Air O2 Flow Rate:    Intake/output summary:   Intake/Output Summary (Last 24 hours) at 04/12/2020 1113 Last data filed at 04/12/2020 0422 Gross per 24 hour  Intake --  Output 800 ml  Net -800 ml   LBM: Last BM Date: 04/03/20 Baseline Weight: Weight: 83.5 kg Most recent weight: Weight: 80.3 kg       Palliative Assessment/Data:      Patient Active Problem List   Diagnosis Date Noted  . Elevated troponin 04/04/2020  . Fall at home, initial encounter 04/04/2020  . Rhabdomyolysis 04/04/2020  . Hypokalemia 04/04/2020  . Thrombocytopenia (HCC) 04/04/2020  . Diarrhea 04/04/2020  . Right rib fracture 04/04/2020  . Lactic acidosis 04/04/2020  . Fever 04/04/2020  . Orbital floor fracture (HCC) 04/04/2020  . Pterygoid plate fracture (HCC) 04/04/2020  . Abnormal LFTs 04/04/2020  . Acute metabolic encephalopathy 04/04/2020  . UTI (urinary tract infection) 04/04/2020  . Elevated hemoglobin A1c 07/06/2019  . Dementia (HCC) 01/07/2019  . 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 Hyponatremia 05/13/2017  . 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 Anemia 03/22/2017  . Essential hypertension 02/29/2016  . Mixed hyperlipidemia 02/29/2016    Palliative Care Assessment & Plan    Recommendations/Plan:  Will f/u tomorrow morning with wife regarding code status and boundaries to care.   Family meeting Friday at 10:00.     Code Status:    Code Status Orders  (From admission, onward)         Start      Ordered   04/05/20 0101  Full code  Continuous        04/05/20 0100        Code Status History    Date Active Date Inactive Code Status Order ID Comments User Context   04/04/2020 1453 04/05/2020 0100 DNR [id number]  Lorretta Harp, MD ED   Advance Care Planning Activity    Advance Directive Documentation   Flowsheet Row Most Recent Value  Type of Advance Directive Healthcare Power of Attorney  Pre-existing out of facility DNR order (yellow form or pink MOST form) --  "MOST" Form in Place? --       Prognosis:   < 6 weeks    Thank you for allowing the Palliative Medicine Team to assist in the care of this patient.   Total Time 35 min Prolonged Time Billed  no      Greater than 50%  of this time was spent counseling and coordinating care related to the above assessment and plan.  Morton Stall, NP  Please contact Palliative Medicine Team phone at [phone] for questions and concerns.

## 2020-04-12 NOTE — Progress Notes (Signed)
PROGRESS NOTE    Travis Craig   [id number]  DOB: 02-14-1941  PCP: Olin Hauser, DO    DOA: 04/04/2020 LOS: 8   Brief Narrative   Travis Craig is a 79 y.o. male with medical history significant of hypertension, hyperlipidemia, hard of hearing, dementia, hyponatremia. Patient presented after a fall suffering facial and right-sided rib fractures and found to have rhabdomyolysis and UTI.  Patient's dementia is fairly mild at baseline, per wife's report, but is having significant behavioral disturbances with agitation and combativeness when awake.  Requires sitter and/or wife to be at bedside for patient's safety.  He was seen by ophthalmology and ENT with no surgical intervention needed for facial fractures.  Urine culture with E. Coli, sensitive to Rocephin, completed course.  Treated with IV fluids for rhabdomyolysis which resolved.  Significant ongoing agitation and combativeness with delirium on top of dementia.  Palliative care consulted.       Assessment & Plan   Principal Problem:   Elevated troponin Active Problems:   Essential hypertension   Mixed hyperlipidemia   Hyponatremia   Fall at home, initial encounter   Rhabdomyolysis   Hypokalemia   Thrombocytopenia (HCC)   Diarrhea   Right rib fracture   Lactic acidosis   Fever   Orbital floor fracture (HCC)   Pterygoid plate fracture (HCC)   Abnormal LFTs   Acute metabolic encephalopathy   UTI (urinary tract infection)   Fall -with significant trauma and multiple facial and rib fractures as outlined below.  Fall was likely due to underlying infection.    Orbital floor fracture Pterygoid plate fracture Right rib fracture Patient evaluated by ophthalmology and ENT.  No need for surgical intervention. -ENT recommendations: Soft diet, avoid blowing nose -Continue scheduled IV Tylenol as pain could be contributing to behavioral disturbances  12/27 pt had large clump of ?organized clot  from his throat - sent for pathology.   Per ENT likely organized blood clot from facial trauma (image below in exam).  Traumatic rhabdomyolysis Resolved - secondary to fall, stable renal function but with transaminitis.   Continue maintenance IV fluids while NPO.   Urinary tract infection -present on admission.  Urine culture grew E. coli sensitive to Rocephin.   Completed course of Rocephin. Blood cultures negative.    Fever secondary to UTI -resolved.  Management as above  Acute metabolic encephalopathy superimposed on  Dementia with behavioral disturbance in setting of infection and hospital environment.   Patient does somewhat better with wife present at bedside.   --one-to-one sitter for safety, especially when wife not present --Delirium precautions  --Schedule evening IM Zyprexa --Per palliative care, consider Risperdal (less sedating vs Zyprexa) --IV Haldol PRN, preferred over Ativan (reserve for agitation not controlled with Zyprexa and/or PRN Haldol) --Serial EKG's to monitor QTc --Seroquel unable to be given as pt NPO  Hypernatremia due to poor PO intake with ongoing delirium, NPO.  Resolved with D5-1/2NS and potassium.  Hypokalemia also likely from poor PO intake.  Replacing with KCl in maintenance fluid as above.  Monitor BMP  Elevated AST/ALT/bilirubin- Improving.  Due to rhabdomyolysis.   RUQ ultrasound unremarkable, showed hepatic steatosis, no focal hepatic lesions and postcholecystectomy sequelae.  Follow CMP.  Elevated troponin - attributed to demand ischemia in setting of fall, infection.   Cardiology was consulted, signed off. EKG and Echo were unremarkable.   Due to dementia, not good candidate for ischemic evaluation.   Patient has not had chest pain.  Essential Hpertension - on  amlodipine  Hyperlipidemia - statin on hold for now.  Thrombocytopenia -resolved.  Suspect due to infection.  Monitor CBC.  Hypokalemia - replaced, monitor BMP and  replace as needed  Diarrhea - resolved.  Lactic acidosis - resolved with IV hydration   DVT prophylaxis: SCDs Start: 04/05/20 0101   Diet:  Diet Orders (From admission, onward)    Start     Ordered   04/08/20 1233  Diet NPO time specified  Diet effective now        04/08/20 1233            Code Status: Full Code    Subjective 04/12/20    Patient seen with wife at bedside, wife trying to keep pt in the bed.  He swings fist at me when I attempt to help keep in the bed.  Says he's getting out of here in 5 minutes.    Disposition Plan & Communication   Status is: Inpatient  Remains inpatient appropriate because:IV treatments appropriate due to intensity of illness or inability to take PO.  Inadequate PO intake with ongoing acute delirium with significant agitation requiring medications for safety of patient and staff.     Dispo: The patient is from: Home              Anticipated d/c is to: SNF              Anticipated d/c date is: 3 days              Patient currently is not medically stable to d/c.   Family Communication: Wife is at bedside on rounds today   Consults, Procedures, Significant Events   Consultants:   Ophthalmology  ENT  Cardiology  Neurosurgery  Procedures:   Laceration repair  Antimicrobials:  Anti-infectives (From admission, onward)   Start     Dose/Rate Route Frequency Ordered Stop   04/08/20 1800  cefTRIAXone (ROCEPHIN) 1 g in sodium chloride 0.9 % 100 mL IVPB        1 g 200 mL/hr over 30 Minutes Intravenous Every 24 hours 04/08/20 1316 04/10/20 1744   04/04/20 1800  cefTRIAXone (ROCEPHIN) 1 g in sodium chloride 0.9 % 100 mL IVPB  Status:  Discontinued        1 g 200 mL/hr over 30 Minutes Intravenous Every 24 hours 04/04/20 1652 04/08/20 1316        Objective   Vitals:   04/11/20 1202 04/11/20 1939 04/12/20 0026 04/12/20 0421  BP: (!) 163/96 (!) 141/94 (!) 148/90   Pulse: 69 70 80 63  Resp: 18 20 20 20   Temp:    98 F  (36.7 C)  TempSrc:      SpO2: 100% 95% 96% 97%  Weight:      Height:        Intake/Output Summary (Last 24 hours) at 04/12/2020 0730 Last data filed at 04/12/2020 0422 Gross per 24 hour  Intake --  Output 1700 ml  Net -1700 ml   Filed Weights   04/05/20 0332 04/08/20 0328 04/10/20 0340  Weight: 79.4 kg 83.5 kg 80.3 kg    Physical Exam:  General exam: awake and agitated, no acute distress HEENT: bilateral periorbital ecchymosis further improved today, opening eyes at times, right upper forehead laceration healing      Respiratory system: CTAB, normal respiratory effort, on room air. Cardiovascular system: normal S1/S2, RRR, no pedal edema.   Gastrointestinal system: unable to examine safely due to agitation Psychiatric - agitated, attempting  to get out of bed, combative swinging fist, cursing, no apparent hallucinations   Labs   Data Reviewed: I have personally reviewed following labs and imaging studies  CBC: Recent Labs  Lab 04/06/20 0501 04/07/20 0516  WBC 10.7* 8.4  HGB 12.0* 12.8*  HCT 33.0* 34.6*  MCV 97.6 96.4  PLT 124* 0000000   Basic Metabolic Panel: Recent Labs  Lab 04/07/20 0516 04/08/20 0810 04/09/20 0538 04/10/20 0417 04/11/20 0424  NA 139 144 149* 148* 144  K 2.8* 3.4* 3.1* 2.7* 3.4*  CL 105 110 115* 113* 114*  CO2 26 25 24 25 24   GLUCOSE 118* 117* 110* 131* 117*  BUN 13 23 25* 18 17  CREATININE 0.60* 0.64 0.72 0.59* 0.64  CALCIUM 8.3* 8.7* 8.7* 8.4* 8.5*   GFR: Estimated Creatinine Clearance: 78.4 mL/min (by C-G formula based on SCr of 0.64 mg/dL). Liver Function Tests: Recent Labs  Lab 04/07/20 0516 04/08/20 0810 04/09/20 0538 04/10/20 0417 04/11/20 0424  AST 185* 152* 134* 125* 96*  ALT 67* 71* 70* 73* 61*  ALKPHOS 56 57 53 52 58  BILITOT 1.9* 2.0* 2.0* 1.6* 1.3*  PROT 6.1* 6.4* 6.3* 6.7 5.9*  ALBUMIN 3.0* 3.1* 3.0* 3.2* 2.9*   No results for input(s): LIPASE, AMYLASE in the last 168 hours. Recent Labs  Lab  04/09/20 0538  AMMONIA 13   Coagulation Profile: No results for input(s): INR, PROTIME in the last 168 hours. Cardiac Enzymes: Recent Labs  Lab 04/07/20 0516 04/08/20 0810 04/09/20 0538 04/10/20 0417 04/11/20 0424  CKTOTAL 984* 625* 492* 442* 313   BNP (last 3 results) No results for input(s): PROBNP in the last 8760 hours. HbA1C: No results for input(s): HGBA1C in the last 72 hours. CBG: No results for input(s): GLUCAP in the last 168 hours. Lipid Profile: No results for input(s): CHOL, HDL, LDLCALC, TRIG, CHOLHDL, LDLDIRECT in the last 72 hours. Thyroid Function Tests: No results for input(s): TSH, T4TOTAL, FREET4, T3FREE, THYROIDAB in the last 72 hours. Anemia Panel: No results for input(s): VITAMINB12, FOLATE, FERRITIN, TIBC, IRON, RETICCTPCT in the last 72 hours. Sepsis Labs: No results for input(s): PROCALCITON, LATICACIDVEN in the last 168 hours.  Recent Results (from the past 240 hour(s))  Culture, blood (routine x 2)     Status: None   Collection Time: 04/04/20  6:49 AM   Specimen: BLOOD  Result Value Ref Range Status   Specimen Description BLOOD LEFT [facility]  Final   Special Requests   Final    BOTTLES DRAWN AEROBIC AND ANAEROBIC Blood Culture adequate volume   Culture   Final    NO GROWTH 5 DAYS Performed at [facility], [address], [place], [place] [address]    Report Status 04/09/2020 FINAL  Final  Culture, blood (routine x 2)     Status: None   Collection Time: 04/04/20  6:49 AM   Specimen: BLOOD  Result Value Ref Range Status   Specimen Description BLOOD RIGHT [facility]  Final   Special Requests   Final    BOTTLES DRAWN AEROBIC AND ANAEROBIC Blood Culture adequate volume   Culture   Final    NO GROWTH 5 DAYS Performed at [facility], [address] [place], [place] [address]    Report Status 04/09/2020 FINAL  Final  Resp Panel by RT-PCR (Flu A&B, Covid) Nasopharyngeal Swab     Status: None   Collection Time: 04/04/20  6:49 AM    Specimen: Nasopharyngeal Swab; Nasopharyngeal(NP) swabs in vial transport medium  Result Value Ref Range Status   SARS Coronavirus 2 by RT PCR NEGATIVE NEGATIVE Final    Comment: (NOTE) SARS-CoV-2 target nucleic acids are NOT DETECTED.  The SARS-CoV-2 RNA is generally detectable in upper respiratory specimens during the acute phase of infection. The lowest concentration of SARS-CoV-2 viral copies this assay can detect is 138 copies/mL. A negative result does not preclude SARS-Cov-2 infection and should not be used as the sole basis for treatment or other patient management decisions. A negative result may occur with  improper specimen collection/handling, submission of specimen other than nasopharyngeal swab, presence of viral mutation(s) within the areas targeted by this assay, and inadequate number of viral copies(<138 copies/mL). A negative result must be combined with clinical observations, patient history, and epidemiological information. The expected result is Negative.  Fact Sheet for Patients:  BloggerCourse.com  Fact Sheet for Healthcare Providers:  SeriousBroker.it  This test is no t yet approved or cleared by the [place] FDA and  has been authorized for detection and/or diagnosis of SARS-CoV-2 by FDA under an Emergency Use Authorization (EUA). This EUA will remain  in effect (meaning this test can be used) for the duration of the COVID-19 declaration under Section 564(b)(1) of the Act, 21 U.S.C.section 360bbb-3(b)(1), unless the authorization is terminated  or revoked sooner.       Influenza A by PCR NEGATIVE NEGATIVE Final   Influenza B by PCR NEGATIVE NEGATIVE Final    Comment: (NOTE) The Xpert Xpress SARS-CoV-2/FLU/RSV plus assay is intended as an aid in the diagnosis of influenza from Nasopharyngeal swab specimens and should not be used as a sole basis for treatment. Nasal washings and aspirates are  unacceptable for Xpert Xpress SARS-CoV-2/FLU/RSV testing.  Fact Sheet for Patients: BloggerCourse.com  Fact Sheet for Healthcare Providers: SeriousBroker.it  This test is not yet approved or cleared by the [place] FDA and has been authorized for detection and/or diagnosis of SARS-CoV-2 by FDA under an Emergency Use Authorization (EUA). This EUA will remain in effect (meaning this test can be used) for the duration of the COVID-19 declaration under Section 564(b)(1) of the Act, 21 U.S.C. section 360bbb-3(b)(1), unless the authorization is terminated or revoked.  Performed at [facility], [address] [place], [place] [address]   Urine Culture     Status: Abnormal   Collection Time: 04/04/20 12:00 PM   Specimen: Urine, Random  Result Value Ref Range Status   Specimen Description   Final    URINE, RANDOM Performed at [facility], [address] [place], [place] [address]    Special Requests   Final    NONE Performed at [facility], [address] [place], [place] [address]    Culture >=100,000 COLONIES/mL ESCHERICHIA COLI (A)  Final   Report Status 04/07/2020 FINAL  Final   Organism ID, Bacteria ESCHERICHIA COLI (A)  Final      Susceptibility   Escherichia coli - MIC*    AMPICILLIN >=32 RESISTANT Resistant     CEFAZOLIN >=64 RESISTANT Resistant     CEFEPIME <=0.12 SENSITIVE Sensitive     CEFTRIAXONE 1 SENSITIVE Sensitive     CIPROFLOXACIN <=0.25 SENSITIVE Sensitive     GENTAMICIN <=1 SENSITIVE Sensitive     IMIPENEM <=0.25 SENSITIVE Sensitive     NITROFURANTOIN <=16 SENSITIVE Sensitive     TRIMETH/SULFA <=20 SENSITIVE Sensitive     AMPICILLIN/SULBACTAM >=32 RESISTANT Resistant     PIP/TAZO 8 SENSITIVE Sensitive     * >=100,000 COLONIES/mL ESCHERICHIA  COLI      Imaging Studies   No results found.   Medications   Scheduled Meds: . calcium-vitamin D  1 tablet Oral Daily   . ipratropium  2 spray Each Nare TID  . lidocaine  1 patch Transdermal Q24H  . OLANZapine  10 mg Intramuscular Q2000  . QUEtiapine  25 mg Oral QHS   Continuous Infusions: . dextrose 5 % and 0.45 % NaCl with KCl 40 mEq/L 100 mL/hr at 04/11/20 2255       LOS: 8 days    Time spent: 25 minutes with greater than 50% spent in coordination of care and direct patient contact    Ezekiel Slocumb, DO [facility]  04/12/2020, 7:30 AM    If 7PM-7AM, please contact night-coverage. How to contact the [facility] Attending or Consulting provider [place] or covering provider during after hours [place], for this patient?    1. Check the care team in [facility] and look for a) attending/consulting [facility] provider listed and b) the [facility] team listed 2. Log into [email or web address] and use [facility]'s universal password to access. If you do not have the password, please contact the hospital operator. 3. Locate the [facility] provider you are looking for under [facility] and page to a number that you can be directly reached. 4. If you still have difficulty reaching the provider, please page the [facility] (Director on Call) for the Hospitalists listed on amion for assistance.

## 2020-04-12 NOTE — Progress Notes (Signed)
Initial Nutrition Assessment  DOCUMENTATION CODES:   Not applicable  INTERVENTION:  Will continue to monitor discussions regarding goals of care. If family would like to pursue aggressive care recommend placement of small-bore NGT for initiation of tube feeds if this aligns with goals of care. If plan is for tube feeds recommend:  -Initiate Jevity 1.5 Cal at 15 mL/hr and advance by 20 mL/hr every 12 hours to goal rate of 55 mL/hr -Provide PROsource TF 45 mL BID per tube -Goal regimen provides 2060 kcal, 106 grams of protein, 1003 mL H2O daily  Monitor magnesium, potassium, and phosphorus daily for at least 3 days, MD to replete as needed, as pt is at risk for refeeding syndrome.  NUTRITION DIAGNOSIS:   Inadequate oral intake related to inability to eat as evidenced by NPO status.  GOAL:   Patient will meet greater than or equal to 90% of their needs  MONITOR:   Diet advancement,Labs,Weight trends,I & O's  REASON FOR ASSESSMENT:   NPO/Clear Liquid Diet    ASSESSMENT:   79 year old male with PMHx of HTN, HLD, hearing loss, dementia admitted after a fall suffering facial (orbital floor and pterygoid plate) and right-sided rib fractures, also with rhabdomyolysis and UTI. Per ophthalmology and ENT no surgical intervention needed for facial fractures.   Patient has been followed by SLP. Recommendation at this time is for patient to remain NPO due to risk for aspiration. He has been NPO since 12/24. Patient also being followed by palliative medicine. Plan is for family meeting on Friday morning. Patient has been agitated and combative with delirium superimposed on dementia.  Met with patient and son at bedside. Patient was sleeping and son reports he has been very agitated today and this is the first time he is getting rest. Son reports prior to patient's fall he had a good appetite and ate very well. Son reports SLP came by today and reports plan is still for patient to remain NPO. Son  endorses plan for family meeting on Friday morning. After discussing with son will defer NFPE at this time as patient has been agitated/combative and is finally able to rest.  Son reports patient's UBW is 180 lbs and that he is weight-stable. Patient documented to be 80.3 kg (177 lbs).  Medications reviewed and include: Oscal with D 1 tablet daily, D5-1/2NS with KCl 40 mEq/L at 100 mL/hr.  Labs reviewed: Sodium 148, Chloride 114.  Unable to determine if patient meets criteria for malnutrition at this time.  NUTRITION - FOCUSED PHYSICAL EXAM:  Deferred.  Diet Order:   Diet Order            Diet NPO time specified  Diet effective now                EDUCATION NEEDS:   No education needs have been identified at this time  Skin:  Skin Assessment: Reviewed RN Assessment (ecchymosis and abrasions)  Last BM:  04/03/2020 per chart  Height:   Ht Readings from Last 1 Encounters:  04/04/20 5' 11"  (1.803 m)   Weight:   Wt Readings from Last 1 Encounters:  04/10/20 80.3 kg   Ideal Body Weight:  78.2 kg  BMI:  Body mass index is 24.69 kg/m.  Estimated Nutritional Needs:   Kcal:  1850-2150  Protein:  95-110 grams  Fluid:  2 L/day  Jacklynn Barnacle, MS, RD, LDN Pager number available on Amion

## 2020-04-12 NOTE — Progress Notes (Addendum)
PROGRESS NOTE    Travis Craig   HQP:[id number]  DOB: [birth year]  PCP: Smitty Cords, DO    DOA: 04/04/2020 LOS: 8   Brief Narrative   Travis Craig is a 79 y.o. male with medical history significant of hypertension, hyperlipidemia, hard of hearing, dementia, hyponatremia. Patient presented after a fall suffering facial and right-sided rib fractures and found to have rhabdomyolysis and UTI.  Patient's dementia is fairly mild at baseline, per wife's report, but is having significant behavioral disturbances with agitation and combativeness when awake.  Requires sitter and/or wife to be at bedside for patient's safety.  He was seen by ophthalmology and ENT with no surgical intervention needed for facial fractures.  Urine culture with E. Coli, sensitive to Rocephin, completed course.  Treated with IV fluids for rhabdomyolysis which resolved.  Significant ongoing agitation and combativeness with delirium on top of dementia.  Palliative care consulted.       Assessment & Plan   Principal Problem:   Elevated troponin Active Problems:   Essential hypertension   Mixed hyperlipidemia   Hyponatremia   Fall at home, initial encounter   Rhabdomyolysis   Hypokalemia   Thrombocytopenia (HCC)   Diarrhea   Right rib fracture   Lactic acidosis   Fever   Orbital floor fracture (HCC)   Pterygoid plate fracture (HCC)   Abnormal LFTs   Acute metabolic encephalopathy   UTI (urinary tract infection)   Fall -with significant trauma and multiple facial and rib fractures as outlined below.  Fall was likely due to underlying infection.    Orbital floor fracture Pterygoid plate fracture Right rib fracture Patient evaluated by ophthalmology and ENT.  No need for surgical intervention. -ENT recommendations: Soft diet, avoid blowing nose -Continue scheduled IV Tylenol as pain could be contributing to behavioral disturbances  12/27 pt had large clump of ?organized clot  from his throat - sent for pathology.   Per ENT likely organized blood clot from facial trauma (image below in exam).  Traumatic rhabdomyolysis Resolved - secondary to fall, stable renal function but with transaminitis.   Continue maintenance IV fluids while NPO.   Urinary tract infection -present on admission.  Urine culture grew E. coli sensitive to Rocephin.   Completed course of Rocephin. Blood cultures negative.    Fever secondary to UTI -resolved.  Management as above  Acute metabolic encephalopathy superimposed on  Dementia with behavioral disturbance in setting of infection and hospital environment.   Patient does somewhat better with wife present at bedside.   --one-to-one sitter for safety, especially when wife not present --Delirium precautions  --Schedule evening IM Zyprexa --Per palliative care, consider Risperdal when taking p.o. (less sedating vs Zyprexa) --IV Haldol PRN --Serial EKG's to monitor QTc --Seroquel unable to be given as pt NPO --Palliative care consulted and plan is for family meeting on Friday morning  Hypernatremia due to poor PO intake with ongoing delirium, NPO.  Resolved with D5-1/2NS and potassium.  Hypokalemia also likely from poor PO intake.  Replacing with KCl in maintenance fluid as above.  Monitor BMP  Elevated AST/ALT/bilirubin- Improving.  Due to rhabdomyolysis.   RUQ ultrasound unremarkable, showed hepatic steatosis, no focal hepatic lesions and postcholecystectomy sequelae.  Follow CMP.  Elevated troponin - attributed to demand ischemia in setting of fall, infection.   Cardiology was consulted, signed off. EKG and Echo were unremarkable.   Due to dementia, not good candidate for ischemic evaluation.   Patient has not had chest pain.  Essential Hpertension -  on amlodipine  Hyperlipidemia - statin on hold for now.  Thrombocytopenia -resolved.  Suspect due to infection.  Monitor CBC.  Hypokalemia - replaced, monitor BMP and  replace as needed  Diarrhea - resolved.  Lactic acidosis - resolved with IV hydration   DVT prophylaxis: SCDs Start: 04/05/20 0101   Diet:  Diet Orders (From admission, onward)    Start     Ordered   04/08/20 1233  Diet NPO time specified  Diet effective now        04/08/20 1233            Code Status: Full Code    Subjective 04/12/20    Patient seen with son at bedside this morning.  Patient is awake and restless but less agitated and combative than he has been other days.  Does not appear to have needed any IV as needed meds since yesterday evening around 7:30 PM.  Was started on scheduled Zyprexa in the evening which was given around 1:30 AM.  Disposition Plan & Communication   Status is: Inpatient  Remains inpatient appropriate because:IV treatments appropriate due to intensity of illness or inability to take PO.  Unsafe for PO intake, ongoing acute delirium with agitation requiring medications for safety of patient and staff.  Goals of care discussions ongoing with palliative care.   Dispo: The patient is from: Home              Anticipated d/c is to: SNF              Anticipated d/c date is: 3 days              Patient currently is not medically stable to d/c.   Family Communication: Wife is at bedside on rounds today   Consults, Procedures, Significant Events   Consultants:   Ophthalmology  ENT  Cardiology  Neurosurgery  Procedures:   Laceration repair  Antimicrobials:  Anti-infectives (From admission, onward)   Start     Dose/Rate Route Frequency Ordered Stop   04/08/20 1800  cefTRIAXone (ROCEPHIN) 1 g in sodium chloride 0.9 % 100 mL IVPB        1 g 200 mL/hr over 30 Minutes Intravenous Every 24 hours 04/08/20 1316 04/10/20 1744   04/04/20 1800  cefTRIAXone (ROCEPHIN) 1 g in sodium chloride 0.9 % 100 mL IVPB  Status:  Discontinued        1 g 200 mL/hr over 30 Minutes Intravenous Every 24 hours 04/04/20 1652 04/08/20 1316         Objective   Vitals:   04/12/20 0026 04/12/20 0421 04/12/20 0810 04/12/20 1220  BP: (!) 148/90  (!) 160/85 (!) 165/86  Pulse: 80 63 (!) 55 63  Resp: 20 20 18 16   Temp:  98 F (36.7 C) 97.6 F (36.4 C) 98.7 F (37.1 C)  TempSrc:   Oral Oral  SpO2: 96% 97% 100% 97%  Weight:      Height:        Intake/Output Summary (Last 24 hours) at 04/12/2020 1430 Last data filed at 04/12/2020 0422 Gross per 24 hour  Intake --  Output 800 ml  Net -800 ml   Filed Weights   04/05/20 0332 04/08/20 0328 04/10/20 0340  Weight: 79.4 kg 83.5 kg 80.3 kg    Physical Exam:  General exam: awake and restless with mild intermittent attempts to get up, no acute distress HEENT: bilateral periorbital ecchymosis continues to resolves, opens eyes intermittently, right upper forehead laceration healing  Respiratory system: CTAB, normal respiratory effort, on room air. Cardiovascular system: normal S1/S2, RRR, no pedal edema.   Gastrointestinal system: soft and nontender, no distention, +bowel sounds   Labs   Data Reviewed: I have personally reviewed following labs and imaging studies  CBC: Recent Labs  Lab 04/06/20 0501 04/07/20 0516  WBC 10.7* 8.4  HGB 12.0* 12.8*  HCT 33.0* 34.6*  MCV 97.6 96.4  PLT 124* 237   Basic Metabolic Panel: Recent Labs  Lab 04/08/20 0810 04/09/20 0538 04/10/20 0417 04/11/20 0424 04/12/20 0616  NA 144 149* 148* 144 148*  K 3.4* 3.1* 2.7* 3.4* 3.6  CL 110 115* 113* 114* 114*  CO2 25 24 25 24 27   GLUCOSE 117* 110* 131* 117* 121*  BUN 23 25* 18 17 23   CREATININE 0.64 0.72 0.59* 0.64 0.64  CALCIUM 8.7* 8.7* 8.4* 8.5* 8.8*   GFR: Estimated Creatinine Clearance: 78.4 mL/min (by C-G formula based on SCr of 0.64 mg/dL). Liver Function Tests: Recent Labs  Lab 04/08/20 0810 04/09/20 0538 04/10/20 0417 04/11/20 0424 04/12/20 0616  AST 152* 134* 125* 96* 79*  ALT 71* 70* 73* 61* 54*  ALKPHOS 57 53 52 58 63  BILITOT 2.0* 2.0* 1.6* 1.3* 1.7*  PROT 6.4*  6.3* 6.7 5.9* 6.1*  ALBUMIN 3.1* 3.0* 3.2* 2.9* 2.8*   No results for input(s): LIPASE, AMYLASE in the last 168 hours. Recent Labs  Lab 04/09/20 0538  AMMONIA 13   Coagulation Profile: No results for input(s): INR, PROTIME in the last 168 hours. Cardiac Enzymes: Recent Labs  Lab 04/08/20 0810 04/09/20 0538 04/10/20 0417 04/11/20 0424 04/12/20 0616  CKTOTAL 625* 492* 442* 313 205   BNP (last 3 results) No results for input(s): PROBNP in the last 8760 hours. HbA1C: No results for input(s): HGBA1C in the last 72 hours. CBG: No results for input(s): GLUCAP in the last 168 hours. Lipid Profile: No results for input(s): CHOL, HDL, LDLCALC, TRIG, CHOLHDL, LDLDIRECT in the last 72 hours. Thyroid Function Tests: No results for input(s): TSH, T4TOTAL, FREET4, T3FREE, THYROIDAB in the last 72 hours. Anemia Panel: No results for input(s): VITAMINB12, FOLATE, FERRITIN, TIBC, IRON, RETICCTPCT in the last 72 hours. Sepsis Labs: No results for input(s): PROCALCITON, LATICACIDVEN in the last 168 hours.  Recent Results (from the past 240 hour(s))  Culture, blood (routine x 2)     Status: None   Collection Time: 04/04/20  6:49 AM   Specimen: BLOOD  Result Value Ref Range Status   Specimen Description BLOOD LEFT [facility]  Final   Special Requests   Final    BOTTLES DRAWN AEROBIC AND ANAEROBIC Blood Culture adequate volume   Culture   Final    NO GROWTH 5 DAYS Performed at [facility], [address], [place], [place] [address]    Report Status 04/09/2020 FINAL  Final  Culture, blood (routine x 2)     Status: None   Collection Time: 04/04/20  6:49 AM   Specimen: BLOOD  Result Value Ref Range Status   Specimen Description BLOOD RIGHT [facility]  Final   Special Requests   Final    BOTTLES DRAWN AEROBIC AND ANAEROBIC Blood Culture adequate volume   Culture   Final    NO GROWTH 5 DAYS Performed at [facility], [address], [place], [place] [address]    Report Status  04/09/2020 FINAL  Final  Resp Panel by RT-PCR (Flu A&B, Covid) Nasopharyngeal Swab     Status: None   Collection  Time: 04/04/20  6:49 AM   Specimen: Nasopharyngeal Swab; Nasopharyngeal(NP) swabs in vial transport medium  Result Value Ref Range Status   SARS Coronavirus 2 by RT PCR NEGATIVE NEGATIVE Final    Comment: (NOTE) SARS-CoV-2 target nucleic acids are NOT DETECTED.  The SARS-CoV-2 RNA is generally detectable in upper respiratory specimens during the acute phase of infection. The lowest concentration of SARS-CoV-2 viral copies this assay can detect is 138 copies/mL. A negative result does not preclude SARS-Cov-2 infection and should not be used as the sole basis for treatment or other patient management decisions. A negative result may occur with  improper specimen collection/handling, submission of specimen other than nasopharyngeal swab, presence of viral mutation(s) within the areas targeted by this assay, and inadequate number of viral copies(<138 copies/mL). A negative result must be combined with clinical observations, patient history, and epidemiological information. The expected result is Negative.  Fact Sheet for Patients:  BloggerCourse.com  Fact Sheet for Healthcare Providers:  SeriousBroker.it  This test is no t yet approved or cleared by the [place] FDA and  has been authorized for detection and/or diagnosis of SARS-CoV-2 by FDA under an Emergency Use Authorization (EUA). This EUA will remain  in effect (meaning this test can be used) for the duration of the COVID-19 declaration under Section 564(b)(1) of the Act, 21 U.S.C.section 360bbb-3(b)(1), unless the authorization is terminated  or revoked sooner.       Influenza A by PCR NEGATIVE NEGATIVE Final   Influenza B by PCR NEGATIVE NEGATIVE Final    Comment: (NOTE) The Xpert Xpress SARS-CoV-2/FLU/RSV plus assay is intended as an aid in the diagnosis of  influenza from Nasopharyngeal swab specimens and should not be used as a sole basis for treatment. Nasal washings and aspirates are unacceptable for Xpert Xpress SARS-CoV-2/FLU/RSV testing.  Fact Sheet for Patients: BloggerCourse.com  Fact Sheet for Healthcare Providers: SeriousBroker.it  This test is not yet approved or cleared by the [place] FDA and has been authorized for detection and/or diagnosis of SARS-CoV-2 by FDA under an Emergency Use Authorization (EUA). This EUA will remain in effect (meaning this test can be used) for the duration of the COVID-19 declaration under Section 564(b)(1) of the Act, 21 U.S.C. section 360bbb-3(b)(1), unless the authorization is terminated or revoked.  Performed at [facility], [address] [place], [place] [address]   Urine Culture     Status: Abnormal   Collection Time: 04/04/20 12:00 PM   Specimen: Urine, Random  Result Value Ref Range Status   Specimen Description   Final    URINE, RANDOM Performed at [facility], [address] [place], [place] [address]    Special Requests   Final    NONE Performed at [facility], [address] [place], [place] [address]    Culture >=100,000 COLONIES/mL ESCHERICHIA COLI (A)  Final   Report Status 04/07/2020 FINAL  Final   Organism ID, Bacteria ESCHERICHIA COLI (A)  Final      Susceptibility   Escherichia coli - MIC*    AMPICILLIN >=32 RESISTANT Resistant     CEFAZOLIN >=64 RESISTANT Resistant     CEFEPIME <=0.12 SENSITIVE Sensitive     CEFTRIAXONE 1 SENSITIVE Sensitive     CIPROFLOXACIN <=0.25 SENSITIVE Sensitive     GENTAMICIN <=1 SENSITIVE Sensitive     IMIPENEM <=0.25 SENSITIVE Sensitive     NITROFURANTOIN <=16 SENSITIVE Sensitive     TRIMETH/SULFA <=20 SENSITIVE Sensitive     AMPICILLIN/SULBACTAM >=32 RESISTANT  Resistant     PIP/TAZO 8 SENSITIVE Sensitive     * >=100,000 COLONIES/mL  ESCHERICHIA COLI      Imaging Studies   No results found.   Medications   Scheduled Meds: . calcium-vitamin D  1 tablet Oral Daily  . ipratropium  2 spray Each Nare TID  . lidocaine  1 patch Transdermal Q24H  . OLANZapine  10 mg Intramuscular Q2000  . QUEtiapine  25 mg Oral QHS   Continuous Infusions: . dextrose 5 % and 0.45 % NaCl with KCl 40 mEq/L 100 mL/hr at 04/12/20 0857       LOS: 8 days    Time spent: 25 minutes with greater than 50% spent in coordination of care and direct patient contact    Pennie Banter, DO [facility]  04/12/2020, 2:30 PM    If 7PM-7AM, please contact night-coverage. How to contact the [facility] Attending or Consulting provider 7A - 7P or covering provider during after hours 7P -7A, for this patient?    1. Check the care team in [facility] and look for a) attending/consulting TR[facility] provider listed and b) the [facility] team listed 2. Log into [email or web address] and use [facility]'s universal password to access. If you do not have the password, please contact the hospital operator. 3. Locate the [facility] provider you are looking for under [facility] and page to a number that you can be directly reached. 4. If you still have difficulty reaching the provider, please page the [facility] (Director on Call) for the Hospitalists listed on amion for assistance.

## 2020-04-12 NOTE — Progress Notes (Signed)
Pt combative, hitting, and kicking staff as staff changed bed chucks due to bladder incontinent. PRN 0.6 mL haldol given at this time. Will continue to monitor.

## 2020-04-13 DIAGNOSIS — R778 Other specified abnormalities of plasma proteins: Secondary | ICD-10-CM | POA: Diagnosis not present

## 2020-04-13 DIAGNOSIS — F0390 Unspecified dementia without behavioral disturbance: Secondary | ICD-10-CM | POA: Diagnosis not present

## 2020-04-13 DIAGNOSIS — Z7189 Other specified counseling: Secondary | ICD-10-CM | POA: Diagnosis not present

## 2020-04-13 DIAGNOSIS — Z515 Encounter for palliative care: Secondary | ICD-10-CM | POA: Diagnosis not present

## 2020-04-13 LAB — BASIC METABOLIC PANEL
Anion gap: 6 (ref 5–15)
BUN: 19 mg/dL (ref 8–23)
CO2: 25 mmol/L (ref 22–32)
Calcium: 9.1 mg/dL (ref 8.9–10.3)
Chloride: 115 mmol/L — ABNORMAL HIGH (ref 98–111)
Creatinine, Ser: 0.6 mg/dL — ABNORMAL LOW (ref 0.61–1.24)
GFR, Estimated: >60 mL/min (ref >60)
Glucose, Bld: 128 mg/dL — ABNORMAL HIGH (ref 70–99)
Potassium: 4 mmol/L (ref 3.5–5.1)
Sodium: 146 mmol/L — ABNORMAL HIGH (ref 135–145)

## 2020-04-13 LAB — MAGNESIUM: Magnesium: 1.9 mg/dL (ref 1.7–2.4)

## 2020-04-13 LAB — PHOSPHORUS: Phosphorus: 3 mg/dL (ref 2.5–4.6)

## 2020-04-13 MED ORDER — LORAZEPAM 2 MG/ML IJ SOLN
1.0000 mg | Freq: Once | INTRAMUSCULAR | Status: AC
  Administered 2020-04-13: 05:00:00 1 mg via INTRAVENOUS
  Filled 2020-04-13: qty 1

## 2020-04-13 MED ORDER — ONDANSETRON HCL 4 MG/2ML IJ SOLN
4.0000 mg | Freq: Four times a day (QID) | INTRAMUSCULAR | Status: DC | PRN
  Filled 2020-04-13: qty 2

## 2020-04-13 MED ORDER — HALOPERIDOL LACTATE 5 MG/ML IJ SOLN: 0.5000 mg | INTRAMUSCULAR | Status: DC | PRN

## 2020-04-13 MED ORDER — LORAZEPAM 2 MG/ML IJ SOLN: 1.0000 mg | INTRAMUSCULAR | Status: DC | PRN

## 2020-04-13 MED ORDER — LORAZEPAM 2 MG/ML IJ SOLN
1.0000 mg | INTRAMUSCULAR | Status: DC | PRN
  Administered 2020-04-13 – 2020-04-15 (×2): 1 mg via INTRAVENOUS
  Filled 2020-04-13 (×4): qty 1

## 2020-04-13 MED ORDER — ACETAMINOPHEN 650 MG RE SUPP
650.0000 mg | Freq: Four times a day (QID) | RECTAL | Status: DC | PRN
  Administered 2020-04-16: 650 mg via RECTAL
  Filled 2020-04-13: qty 1

## 2020-04-13 MED ORDER — POLYVINYL ALCOHOL 1.4 % OP SOLN
1.0000 [drp] | Freq: Four times a day (QID) | OPHTHALMIC | Status: DC | PRN
  Filled 2020-04-13: qty 15

## 2020-04-13 MED ORDER — SODIUM CHLORIDE 0.9% FLUSH
3.0000 mL | Freq: Two times a day (BID) | INTRAVENOUS | Status: DC
  Administered 2020-04-13 – 2020-04-16 (×6): 3 mL via INTRAVENOUS

## 2020-04-13 MED ORDER — BIOTENE DRY MOUTH MT LIQD: 15.0000 mL | OROMUCOSAL | Status: DC | PRN

## 2020-04-13 MED ORDER — HALOPERIDOL LACTATE 2 MG/ML PO CONC
0.5000 mg | ORAL | Status: DC | PRN
  Filled 2020-04-13: qty 0.3

## 2020-04-13 MED ORDER — GLYCOPYRROLATE 0.2 MG/ML IJ SOLN
0.2000 mg | INTRAMUSCULAR | Status: DC | PRN
  Administered 2020-04-15: 0.2 mg via SUBCUTANEOUS
  Filled 2020-04-13 (×2): qty 1

## 2020-04-13 MED ORDER — LORAZEPAM 2 MG/ML PO CONC
1.0000 mg | ORAL | Status: DC | PRN
  Administered 2020-04-14: 12:00:00 1 mg via SUBLINGUAL

## 2020-04-13 MED ORDER — MORPHINE SULFATE (PF) 2 MG/ML IV SOLN
1.0000 mg | INTRAVENOUS | Status: DC | PRN
  Administered 2020-04-13 – 2020-04-16 (×9): 2 mg via INTRAVENOUS
  Filled 2020-04-13 (×9): qty 1

## 2020-04-13 MED ORDER — ONDANSETRON 4 MG PO TBDP
4.0000 mg | ORAL_TABLET | Freq: Four times a day (QID) | ORAL | Status: DC | PRN
  Filled 2020-04-13: qty 1

## 2020-04-13 MED ORDER — ACETAMINOPHEN 325 MG PO TABS: 650.0000 mg | ORAL_TABLET | Freq: Four times a day (QID) | ORAL | Status: DC | PRN

## 2020-04-13 MED ORDER — HALOPERIDOL LACTATE 5 MG/ML IJ SOLN
3.0000 mg | INTRAMUSCULAR | Status: DC | PRN
  Administered 2020-04-14 – 2020-04-15 (×3): 3 mg via INTRAVENOUS
  Filled 2020-04-13 (×3): qty 1

## 2020-04-13 MED ORDER — GLYCOPYRROLATE 0.2 MG/ML IJ SOLN
0.2000 mg | INTRAMUSCULAR | Status: DC | PRN
  Administered 2020-04-15 – 2020-04-16 (×4): 0.2 mg via INTRAVENOUS
  Filled 2020-04-13 (×6): qty 1

## 2020-04-13 MED ORDER — SODIUM CHLORIDE 0.9% FLUSH
3.0000 mL | INTRAVENOUS | Status: DC | PRN
Start: 2020-04-13 — End: 2020-04-16

## 2020-04-13 NOTE — Progress Notes (Addendum)
Speech Language Pathology Treatment: Dysphagia  Patient Details Name: Travis Craig MRN: [id number] DOB: [birth year] Today's Date: 04/13/2020 Time: 0915-1000 SLP Time Calculation (min) (ACUTE ONLY): 45 min  Assessment / Plan / Recommendation Clinical Impression  Pt continues to present w/ oropharyngeal phase dysphagia w/ significant risk for aspiration secondary to the dysphagia and to his significantly Declined Cognitive status/awareness/alertness. Pt has a Baseline of Dementia which appears significantly increased per Wife's description from his Baseline at home(goes to [place] to work out, eats an oral diet).  ST services has been following pt this week for any improvement in status but there has been minimal change to indicate safety for oral intake, meals. During this session, pt presented w/ eyes closed lying in bed. Noted open-mouth posture and audible, wet/gurgly breathing. He did not consistently respond nor follow any instructions given verbal/tactile stim. He responded to Mod+ tactile/verbal stim w/ seeming agitation. Oral care given to clear dried skin on tongue, little to no saliva noted orally- dryness of oral cavity. NA has been performing excellent oral care w/ pt.  Pt positioned upright and given oral stim of TSP of Nectar liquids at lips w/ min-little response but not closing lips on utensil or attempting any lingual licking/smacking of bolus residue on lips/teeth. Given min more oral stim and encouragement, the bolus material contacted the tongue, and pt again responded w/ little to no lingual movements to address. No pharyngeal swallows followed immediately but were noted x2 during session -- suspect decreased pharyngeal pressure and lingual strength during the swallow as pt's tongue remained forward. Pt exhibited wet, congested coughing and breathing during this session. No further po's attempted. Monitored pt's status until he calmed, and returned to open-mouth posture  breathing.  Much time and education was given to Wife at bedside on pt's presentation of Dysphagia; aspiration and consequences of aspiration including pneumonia. Also discussed Dementia's impact on swallowing, Dysphagia.   Recommend strict NPO status d/t High risk for aspiration and Pulmonary decline - recommend alternative means for all medications. Pt would be to meet any nutritional needs orally/safely at this time. Recommend frequent oral care for hygiene and stimulation of swallowing, aspiration precautions during. Noted Palliative notes and a family meeting w/ Palliative Care services pending for Friday for full GOC; Wife stated pt would not want a feeding tube. ST services will be available for MD to reconsult if significant improvement is noted in pt's status indicating appropriateness for oral intake, further assessment. Encouraged ideas to promote calm environment for pt secondary to the Dementia. NSG/MD and Wife updated, agreed.    HPI HPI: Pt is a 79 y.o. male with medical history significant of Dementia, hypertension, hyperlipidemia, hard of hearing, hyponatremia, varicose vein, who presents with fall, UTI.  Per patient's wife, patient has been having diarrhea for more than 4 days, initially he has diarrhea every few hours. His diarrhea has been improving, but still has several times of watery diarrhea each day.  Patient does not seem to have nausea, vomiting or abdominal pain per his wife.  He has fever, no chills.  His body temperature is 100.7 in ED.  Per his wife, patient has Dementia, but at his normal baseline patient is oriented most of the time . In the past several days, patient has been more confused.  Per his wife, he got out of bed and started getting ready for what he thought was work that morning (he is not currently employed).  He fell outside and was found lying in road  by bystander.  Pt hit his head, and developed a large hematoma above right eye. He also has bruises in the right  arm.  He has been increasingly confused since admit requiring Mitts and a Sitter currently.  Medications for calming agitation have been attempted per chrat notes/Wife; Seroquel at bedtime currently. Wife endorsed "sun-downing" at home.      SLP Plan  Continue with current plan of care (monitor status for any change)       Recommendations  Diet recommendations: NPO Medication Administration: Via alternative means                General recommendations:  (Palliative Care) Oral Care Recommendations: Oral care QID;Staff/trained caregiver to provide oral care Follow up Recommendations:  (TBD) SLP Visit Diagnosis: Dysphagia, oropharyngeal phase (R13.12) (Dementia) Plan: Continue with current plan of care (monitor status for any change)       GO                 Travis Som, MS, CCC-SLP Speech Language Pathologist Rehab Services [phone] [facility] 04/13/2020, 10:09 AM

## 2020-04-13 NOTE — Progress Notes (Signed)
PROGRESS NOTE    Travis Craig  JGG:[id number] DOB: Dec 24, 1940 DOA: 04/04/2020 PCP: Smitty Cords, DO   Chief Complaint  Patient presents with  . Fall    Brief Narrative: 79 year old male with significant history of hypertension, hyperlipidemia, hard of hearing, dementia, hyponatremia presented after fall sustaining facial and right-sided rib fractures, and found to have rhabdomyolysis and UTI.  Patient has been having significant behavioral disturbance with agitation and combativeness when awake in the setting of his dementia.  He was seen in the ED and was admitted.  Seen by ophthalmology and ENT with no surgical intervention needed for facial fractures.  He grew E. coli and completed IV antibiotic course.  His rhabdomyolysis resolved with IV fluid hydration.ENT recommendations: Soft diet, avoid blowing nose. 12/27 pt had large clump of ?organized clot from his throat - sent for pathology. Per ENT likely organized blood clot from facial trauma He has been having significant agitation and combativeness with delirium on top of dementia, continues to decline and has not made much improvement, palliative care has been involved and there is ongoing discussion about comfort measures.  Subjective: One to one in place, wife at bedside Sleepy- mumbles words" I need help" when asked " I don't know" not following commands, but difficult to comprehend the speech. Bruise noted on face.   Assessment & Plan:  Fall with fractures orbital floor fracture/pterygoid plate fracture/right rib fracture: No surgical intervention needed at this time continue supportive measures pain control.  Continue pain control.    Acute metabolic encephalopathy in the setting of dementia with behavioral disturbance: He does somewhat better with wife present at bedside.  He has been on one-to-one sitter for safety.  Continue delirium precaution sedative medication as needed-with Zyprexa/Haldol/Risperdal.   Palliative care following. Remains n.p.o. due to his mental status and speech following-not safe for any thing by mouth.  Traumatic rhabdomyolysis resolved with IV fluids. E. coli UTI completed ceftriaxone Hypokalemia from poor oral intake Hypernatremia due to poor PO intake resolved with IV fluids hypotonic.  Elevated AST/ALT/bilirubin- Improving.  Due to rhabdomyolysis.    Unremarkable right upper quadrant ultrasound. Elevated troponin - attributed to demand ischemia in setting of fall, infection.  Cardiology was consulted, signed off.EKG and Echo were unremarkable. Due to dementia, not good candidate for ischemic evaluation.    Essential Hpertension - on amlodipine  Hyperlipidemia -statin on hold  Thrombocytopenia  resolved.  Diarrhea - resolved. Lactic acidosis and dehydration resolved.  "DNR, overall prognosis remains poor patient still unable to take anything by mouth no significant improvement of mental status.  Palliative care has been following for ongoing discussion regarding comfort measures.  Noted plan to convert to comfort measures and inpatient hospice  Nutrition: Diet Order            Diet NPO time specified  Diet effective now                 Nutrition Problem: Inadequate oral intake Etiology: inability to eat Signs/Symptoms: NPO status Interventions: Refer to RD note for recommendations  Body mass index is 24.69 kg/m.  DVT prophylaxis: SCDs Start: 04/05/20 0101 Code Status:   Code Status: Full Code  Family Communication: plan of care discussed with patient at bedside.  Status is: Inpatient  Remains inpatient appropriate because:IV treatments appropriate due to intensity of illness or inability to take PO and Inpatient level of care appropriate due to severity of illness   Dispo:  Patient From: Home  Planned Disposition: hospice  Expected  discharge date: 04/15/2020  Medically stable for discharge: No  Consultants:see note  Procedures:see  note  Culture/Microbiology    Component Value Date/Time   SDES  04/04/2020 1200    URINE, RANDOM Performed at [facility], [address] [place], [place] [address]    [facility]  04/04/2020 1200    NONE Performed at [facility], [address] [place], [place] [address]    CULT >=100,000 COLONIES/mL ESCHERICHIA COLI (A) 04/04/2020 1200   REPTSTATUS 04/07/2020 FINAL 04/04/2020 1200    Other culture-see note  Medications: Scheduled Meds: . calcium-vitamin D  1 tablet Oral Daily  . ipratropium  2 spray Each Nare TID  . lidocaine  1 patch Transdermal Q24H  . ziprasidone  10 mg Intramuscular QHS   Continuous Infusions: . dextrose 5 % and 0.45 % NaCl with KCl 40 mEq/L 100 mL/hr at 04/13/20 0917    Antimicrobials: Anti-infectives (From admission, onward)   Start     Dose/Rate Route Frequency Ordered Stop   04/08/20 1800  cefTRIAXone (ROCEPHIN) 1 g in sodium chloride 0.9 % 100 mL IVPB        1 g 200 mL/hr over 30 Minutes Intravenous Every 24 hours 04/08/20 1316 04/10/20 1744   04/04/20 1800  cefTRIAXone (ROCEPHIN) 1 g in sodium chloride 0.9 % 100 mL IVPB  Status:  Discontinued        1 g 200 mL/hr over 30 Minutes Intravenous Every 24 hours 04/04/20 1652 04/08/20 1316     Objective: Vitals: Today's Vitals   04/12/20 1950 04/13/20 0021 04/13/20 0301 04/13/20 0718  BP: (!) 175/92 (!) 190/116 120/70   Pulse: 62 66 70   Resp: 18 16 17    Temp: 97.9 F (36.6 C) 98.2 F (36.8 C) 98 F (36.7 C)   TempSrc:      SpO2: 99% 100% 100%   Weight:      Height:      PainSc:   0-No pain Asleep    Intake/Output Summary (Last 24 hours) at 04/13/2020 1015 Last data filed at 04/12/2020 2024 Gross per 24 hour  Intake 2246.6 ml  Output 700 ml  Net 1546.6 ml   Filed Weights   04/05/20 0332 04/08/20 0328 04/10/20 0340  Weight: 79.4 kg 83.5 kg 80.3 kg   Weight change:   Intake/Output from previous day: 12/28 0701 - 12/29 0700 In: 2246.6  [I.V.:2246.6] Out: 700 [Urine:700] Intake/Output this shift: No intake/output data recorded.  Examination: General exam: eyes closed,sleepy- mumbles words on touching him and gets agitated,elderly and frail HEENT:Oral mucosa moist, Ear/Nose WNL grossly,dentition normal. Respiratory system: bilaterally crackles at base,no use of accessory muscle, non tender. Cardiovascular system: S1 & S2 +, regular, No JVD. Gastrointestinal system: Abdomen soft, NT,ND, BS+. Nervous System: confused, moving extremities. Extremities: No edema, distal peripheral pulses palpable.  Skin: No rashes,no icterus. MSK: Normal muscle bulk,tone, power  Data Reviewed: I have personally reviewed following labs and imaging studies CBC: Recent Labs  Lab 04/07/20 0516  WBC 8.4  HGB 12.8*  HCT 34.6*  MCV 96.4  PLT 628   Basic Metabolic Panel: Recent Labs  Lab 04/09/20 0538 04/10/20 0417 04/11/20 0424 04/12/20 0616 04/13/20 0700  NA 149* 148* 144 148* 146*  K 3.1* 2.7* 3.4* 3.6 4.0  CL 115* 113* 114* 114* 115*  CO2 24 25 24 27 25   GLUCOSE 110* 131* 117* 121* 128*  BUN 25* 18 17 23 19   CREATININE 0.72 0.59* 0.64 0.64 0.60*  CALCIUM 8.7* 8.4* 8.5* 8.8*  9.1  MG  --   --   --   --  1.9  PHOS  --   --   --   --  3.0   GFR: Estimated Creatinine Clearance: 78.4 mL/min (A) (by C-G formula based on SCr of 0.6 mg/dL (L)). Liver Function Tests: Recent Labs  Lab 04/08/20 0810 04/09/20 0538 04/10/20 0417 04/11/20 0424 04/12/20 0616  AST 152* 134* 125* 96* 79*  ALT 71* 70* 73* 61* 54*  ALKPHOS 57 53 52 58 63  BILITOT 2.0* 2.0* 1.6* 1.3* 1.7*  PROT 6.4* 6.3* 6.7 5.9* 6.1*  ALBUMIN 3.1* 3.0* 3.2* 2.9* 2.8*   No results for input(s): LIPASE, AMYLASE in the last 168 hours. Recent Labs  Lab 04/09/20 0538  AMMONIA 13   Coagulation Profile: No results for input(s): INR, PROTIME in the last 168 hours. Cardiac Enzymes: Recent Labs  Lab 04/08/20 0810 04/09/20 0538 04/10/20 0417 04/11/20 0424  04/12/20 0616  CKTOTAL 625* 492* 442* 313 205   BNP (last 3 results) No results for input(s): PROBNP in the last 8760 hours. HbA1C: No results for input(s): HGBA1C in the last 72 hours. CBG: No results for input(s): GLUCAP in the last 168 hours. Lipid Profile: No results for input(s): CHOL, HDL, LDLCALC, TRIG, CHOLHDL, LDLDIRECT in the last 72 hours. Thyroid Function Tests: No results for input(s): TSH, T4TOTAL, FREET4, T3FREE, THYROIDAB in the last 72 hours. Anemia Panel: No results for input(s): VITAMINB12, FOLATE, FERRITIN, TIBC, IRON, RETICCTPCT in the last 72 hours. Sepsis Labs: No results for input(s): PROCALCITON, LATICACIDVEN in the last 168 hours.  Recent Results (from the past 240 hour(s))  Culture, blood (routine x 2)     Status: None   Collection Time: 04/04/20  6:49 AM   Specimen: BLOOD  Result Value Ref Range Status   Specimen Description BLOOD LEFT [facility]  Final   Special Requests   Final    BOTTLES DRAWN AEROBIC AND ANAEROBIC Blood Culture adequate volume   Culture   Final    NO GROWTH 5 DAYS Performed at [facility], [address], [place], [place] [address]    Report Status 04/09/2020 FINAL  Final  Culture, blood (routine x 2)     Status: None   Collection Time: 04/04/20  6:49 AM   Specimen: BLOOD  Result Value Ref Range Status   Specimen Description BLOOD RIGHT [facility]  Final   Special Requests   Final    BOTTLES DRAWN AEROBIC AND ANAEROBIC Blood Culture adequate volume   Culture   Final    NO GROWTH 5 DAYS Performed at [facility], [address] [place], [place] [address]    Report Status 04/09/2020 FINAL  Final  Resp Panel by RT-PCR (Flu A&B, Covid) Nasopharyngeal Swab     Status: None   Collection Time: 04/04/20  6:49 AM   Specimen: Nasopharyngeal Swab; Nasopharyngeal(NP) swabs in vial transport medium  Result Value Ref Range Status   SARS Coronavirus 2 by RT PCR NEGATIVE NEGATIVE Final    Comment: (NOTE) SARS-CoV-2 target  nucleic acids are NOT DETECTED.  The SARS-CoV-2 RNA is generally detectable in upper respiratory specimens during the acute phase of infection. The lowest concentration of SARS-CoV-2 viral copies this assay can detect is 138 copies/mL. A negative result does not preclude SARS-Cov-2 infection and should not be used as the sole basis for treatment or other patient management decisions. A negative result may occur with  improper specimen collection/handling, submission of specimen other than nasopharyngeal  swab, presence of viral mutation(s) within the areas targeted by this assay, and inadequate number of viral copies(<138 copies/mL). A negative result must be combined with clinical observations, patient history, and epidemiological information. The expected result is Negative.  Fact Sheet for Patients:  BloggerCourse.com  Fact Sheet for Healthcare Providers:  SeriousBroker.it  This test is no t yet approved or cleared by the [place] FDA and  has been authorized for detection and/or diagnosis of SARS-CoV-2 by FDA under an Emergency Use Authorization (EUA). This EUA will remain  in effect (meaning this test can be used) for the duration of the COVID-19 declaration under Section 564(b)(1) of the Act, 21 U.S.C.section 360bbb-3(b)(1), unless the authorization is terminated  or revoked sooner.       Influenza A by PCR NEGATIVE NEGATIVE Final   Influenza B by PCR NEGATIVE NEGATIVE Final    Comment: (NOTE) The Xpert Xpress SARS-CoV-2/FLU/RSV plus assay is intended as an aid in the diagnosis of influenza from Nasopharyngeal swab specimens and should not be used as a sole basis for treatment. Nasal washings and aspirates are unacceptable for Xpert Xpress SARS-CoV-2/FLU/RSV testing.  Fact Sheet for Patients: BloggerCourse.com  Fact Sheet for Healthcare  Providers: SeriousBroker.it  This test is not yet approved or cleared by the [place] FDA and has been authorized for detection and/or diagnosis of SARS-CoV-2 by FDA under an Emergency Use Authorization (EUA). This EUA will remain in effect (meaning this test can be used) for the duration of the COVID-19 declaration under Section 564(b)(1) of the Act, 21 U.S.C. section 360bbb-3(b)(1), unless the authorization is terminated or revoked.  Performed at [facility], [address] [place], [place] [address]   Urine Culture     Status: Abnormal   Collection Time: 04/04/20 12:00 PM   Specimen: Urine, Random  Result Value Ref Range Status   Specimen Description   Final    URINE, RANDOM Performed at [facility], [address] [place], [place] [address]    Special Requests   Final    NONE Performed at [facility], [address] [place], [place] [address]    Culture >=100,000 COLONIES/mL ESCHERICHIA COLI (A)  Final   Report Status 04/07/2020 FINAL  Final   Organism ID, Bacteria ESCHERICHIA COLI (A)  Final      Susceptibility   Escherichia coli - MIC*    AMPICILLIN >=32 RESISTANT Resistant     CEFAZOLIN >=64 RESISTANT Resistant     CEFEPIME <=0.12 SENSITIVE Sensitive     CEFTRIAXONE 1 SENSITIVE Sensitive     CIPROFLOXACIN <=0.25 SENSITIVE Sensitive     GENTAMICIN <=1 SENSITIVE Sensitive     IMIPENEM <=0.25 SENSITIVE Sensitive     NITROFURANTOIN <=16 SENSITIVE Sensitive     TRIMETH/SULFA <=20 SENSITIVE Sensitive     AMPICILLIN/SULBACTAM >=32 RESISTANT Resistant     PIP/TAZO 8 SENSITIVE Sensitive     * >=100,000 COLONIES/mL ESCHERICHIA COLI     Radiology Studies: No results found.   LOS: 9 days   Lanae Boast, MD [facility]  04/13/2020, 10:15 AM

## 2020-04-13 NOTE — Progress Notes (Signed)
[facility] Liaison note:  New referral for family interest in [facility] received from Palliative NP [employer]. TOC Meagan Hagwood aware. Patient information sent to referral. Hospice eligibility pending review.  Writer met in the room with patient's wife  Travis Craig to initiate education regarding hospice services, philosophy, team approach to care and current visit ation policy with understanding voiced. Questions answered.  Travis Craig and the hospital care team are aware that at this time Travis Craig is unable to offer a bed. Hospice information and contact numbers given to [facility]. Liaisons will follow and update all regarding bed availability. Thank you for the opportunity to be involved in the care of this patient and his family.  Flo Shanks BSN, RN, [place] [phone]

## 2020-04-13 NOTE — Progress Notes (Signed)
Nutrition Brief Follow-Up Note  Chart reviewed. Patient now transitioning to comfort care.   No further nutrition interventions warranted at this time. Please consult RD as needed.   Galilee Pierron King, MS, RD, LDN Pager number available on Amion 

## 2020-04-13 NOTE — TOC Progression Note (Signed)
Transition of Care [facility]) - Progression Note    Patient Details  Name: Travis Craig MRN: [id number] Date of Birth: 1940-07-14  Transition of Care [facility]) CM/SW Contact  Liliana Cline, LCSW Phone Number: 04/13/2020, 1:43 PM  Clinical Narrative:       CSW informed by Palliative NP that family would like comfort care, [facility]. Clydie Braun with [facility] aware. No beds today.         Expected Discharge Plan and Services                                                 Social Determinants of Health (SDOH) Interventions    Readmission Risk Interventions No flowsheet data found.

## 2020-04-13 NOTE — Progress Notes (Signed)
SLP Cancellation Note  Patient Details Name: Travis Craig MRN: [id number] DOB: [birth year]   Cancelled treatment:       Reason Eval/Treat Not Completed:  (chart reviewed; consulted Palliative Care this pm). Patient now transitioning to comfort care post further discussion w/ family re: pt's GOC. Recommend oral care for comfort. ST services will sign off at this time.      Jerilynn Som, MS, CCC-SLP Speech Language Pathologist Rehab Services [phone] [facility] 04/13/2020, 5:06 PM

## 2020-04-13 NOTE — Progress Notes (Addendum)
Daily Progress Note   Patient Name: Travis Craig       Date: 04/13/2020 DOB: 1940-06-12  Age: 79 y.o. MRN#: [id number] Attending Physician: Antonieta Pert, MD Primary Care Physician: Olin Hauser, DO Admit Date: 04/04/2020  Reason for Consultation/Follow-up: Establishing goals of care  Subjective: Patient is resting in bed. He is agitated with sitter at bedside and mittens in place. He tries to push staff away and reaches out into the air in front of him. He states things that do not make sense, and also states he wants to "get out of here." His wife is at bedside. We discussed his agitation and his wishes if he were abe to voice them. Wife states she spoke with son last night, and they agree patient is suffering and would not agree with his current treatment, and need for current and future assistance as he is an independent man. She states she continues to be baffled by the fact that is was fully independent, and out and about 3 weeks ago.  She does not wish to wait until Friday to assess outcomes, and states he would want to focus on comfort and dignity for what time he has left on earth. Patient said "thank God" when wife stated this. Discussed with him plan for comfort care and he said "yea" and appeared to nestle into bed and relax.   We discussed his symptom mangement needs as he has received several different medications to help with agitation; discussed this with nursing as well.   I completed a MOST form today with wife and the signed original was placed in the chart. A photocopy was placed in the chart to be scanned into EMR. The patient outlined their wishes for the following treatment decisions:  Cardiopulmonary Resuscitation: Do Not Attempt Resuscitation (DNR/No CPR)   Medical Interventions: Comfort Measures: Keep clean, warm, and dry. Use medication by any route, positioning, wound care, and other measures to relieve pain and suffering. Use oxygen, suction and manual treatment of airway obstruction as needed for comfort. Do not transfer to the hospital unless comfort needs cannot be met in current location.  Antibiotics: No antibiotics (use other measures to relieve symptoms)  IV Fluids: No IV fluids (provide other measures to ensure comfort)  Feeding Tube: No feeding tube    Length of Stay: 9  Current Medications: Scheduled Meds:  . ipratropium  2 spray Each Nare TID  . sodium chloride flush  3 mL Intravenous Q12H  . ziprasidone  10 mg Intramuscular QHS    Continuous Infusions:   PRN Meds: acetaminophen **OR** acetaminophen, antiseptic oral rinse, glycopyrrolate **OR** glycopyrrolate, [DISCONTINUED] haloperidol **OR** haloperidol lactate, LORazepam **OR** [DISCONTINUED] LORazepam, LORazepam, morphine injection, ondansetron (ZOFRAN) IV, ondansetron **OR** ondansetron (ZOFRAN) IV, polyvinyl alcohol, sodium chloride flush  Physical Exam Pulmonary:     Effort: Pulmonary effort is normal.  Neurological:     Mental Status: He is alert.             Vital Signs: BP (!) 148/76 (BP Location: Left Arm)   Pulse 68   Temp 98.5 F (36.9 C) (Axillary)   Resp 19   Ht 5' 11"  (1.803 m)   Wt 80.3 kg   SpO2 98%   BMI 24.69 kg/m  SpO2: SpO2: 98 % O2 Device: O2 Device: Room Air O2 Flow Rate:    Intake/output summary:   Intake/Output Summary (Last 24 hours) at 04/13/2020 1304 Last data filed at 04/12/2020 2024 Gross per 24 hour  Intake 2246.6 ml  Output 700 ml  Net 1546.6 ml   LBM: Last BM Date: 04/03/20 Baseline Weight: Weight: 83.5 kg Most recent weight: Weight: 80.3 kg       Flowsheet Rows   Flowsheet Row Most Recent Value  Intake Tab   Referral Department Hospitalist  Unit at Time of Referral Med/Surg Unit  Palliative Care Primary  Diagnosis Neurology  Date Notified 04/10/20  Palliative Care Type New Palliative care  Reason for referral Clarify Goals of Care  Date of Admission 04/04/20  Date first seen by Palliative Care 04/11/20  # of days Palliative referral response time 1 Day(s)  # of days IP prior to Palliative referral 6  Clinical Assessment   Psychosocial & Spiritual Assessment   Palliative Care Outcomes       Patient Active Problem List   Diagnosis Date Noted  . Elevated troponin 04/04/2020  . Fall at home, initial encounter 04/04/2020  . Rhabdomyolysis 04/04/2020  . Hypokalemia 04/04/2020  . Thrombocytopenia ([place]) 04/04/2020  . Diarrhea 04/04/2020  . Right rib fracture 04/04/2020  . Lactic acidosis 04/04/2020  . Fever 04/04/2020  . Orbital floor fracture ([place]) 04/04/2020  . Pterygoid plate fracture ([place]) 04/04/2020  . Abnormal LFTs 04/04/2020  . Acute metabolic encephalopathy 50/27/7412  . UTI (urinary tract infection) 04/04/2020  . Elevated hemoglobin A1c 07/06/2019  . Dementia ([place]) 01/07/2019  . 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 Hyponatremia 05/13/2017  . 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 Anemia 03/22/2017  . Essential hypertension 02/29/2016  . Mixed hyperlipidemia 02/29/2016    Palliative Care Assessment & Plan    Recommendations/Plan:  Shifting to comfort care. Hospice facility referral made.   Ativan and Haldol in place for agitation. Morphine for pain and SOB. Please use he PRN medications as needed.     Code Status:    Code Status Orders  (From admission, onward)         Start     Ordered   04/13/20 1249  Do not attempt resuscitation (DNR)  Continuous       Question Answer Comment  In the event of cardiac or respiratory ARREST Do not call a "code blue"   In the event of cardiac or respiratory ARREST Do not perform Intubation, CPR, defibrillation or ACLS   In the event  of cardiac or respiratory ARREST Use  medication by any route, position, wound care, and other measures to relive pain and suffering. May use oxygen, suction and manual treatment of airway obstruction as needed for comfort.   Comments MOST form in chart.      04/13/20 1251        Code Status History    Date Active Date Inactive Code Status Order ID Comments User Context   04/05/2020 0100 04/13/2020 1247 Full Code [id number]  Ivor Costa, MD Inpatient   04/04/2020 1453 04/05/2020 0100 DNR [id number]  Ivor Costa, MD ED   Advance Care Planning Activity    Advance Directive Documentation   Flowsheet Row Most Recent Value  Type of Advance Directive Healthcare Power of Attorney  Pre-existing out of facility DNR order (yellow form or pink MOST form) --  "MOST" Form in Place? --       Prognosis:  < 2 weeks  NPO, difficulty clearing secretions of which staff is suctioning out. Stopping IVF. Requires IV medication for agitation.    Epic chat sent to attending, and TOC. Spoke with RN.   Thank you for allowing the Palliative Medicine Team to assist in the care of this patient.   Time In: 12:20 Time Out: 1:10 Total Time 50 min Prolonged Time Billed  no      Greater than 50%  of this time was spent counseling and coordinating care related to the above assessment and plan.  Asencion Gowda, NP  Please contact Palliative Medicine Team phone at [phone] for questions and concerns.

## 2020-04-13 NOTE — Progress Notes (Signed)
SLP Cancellation Note  Patient Details Name: Travis Craig MRN: [id number] DOB: 02/10/1941   Cancelled treatment:       Reason Eval/Treat Not Completed: Fatigue/lethargy limiting ability to participate;Patient's level of consciousness (chart reviewed; consulted Son in room re: pt's status). Thoroughly discussed pt's high risk for aspiration at this time in his current presentation, Cognitive decline. Pt exhibits a wet/gurgly vocal quality at rest and often coughs on secretions/phlegm. Recommend HOB elevated slightly; frequent oral care. Recommend continue NPO status at this time. NSG updated. Son agreed.     Jerilynn Som, MS, CCC-SLP Speech Language Pathologist Rehab Services [phone] [facility] 04/13/2020, 9:30 AM

## 2020-04-13 NOTE — Progress Notes (Signed)
PT Cancellation Note  Patient Details Name: Travis Craig MRN: [id number] DOB: Mar 22, 1941   Cancelled Treatment:    Reason Eval/Treat Not Completed: Fatigue/lethargy limiting ability to participate;Medical issues which prohibited therapy   Checked on pt this am.  RN in room.  After discussion with RN, pt not appropriate for therapy today.  Will check back in afternoon as time allows but anticipate return tomorrow and continue as appropriate.   Danielle Dess 04/13/2020, 9:28 AM

## 2020-04-14 MED ORDER — CHLORHEXIDINE GLUCONATE 0.12 % MT SOLN
15.0000 mL | Freq: Two times a day (BID) | OROMUCOSAL | Status: DC
  Administered 2020-04-14 – 2020-04-16 (×4): 15 mL via OROMUCOSAL
  Filled 2020-04-14 (×5): qty 15

## 2020-04-14 MED ORDER — ORAL CARE MOUTH RINSE
15.0000 mL | Freq: Two times a day (BID) | OROMUCOSAL | Status: DC
  Administered 2020-04-14 – 2020-04-16 (×4): 15 mL via OROMUCOSAL

## 2020-04-14 NOTE — Care Management Important Message (Signed)
Important Message  Patient Details  Name: Travis Craig MRN: [id number] Date of Birth: [birth year]   Medicare Important Message Given:  Other (see comment)  Patient on Comfort Care and awaiting bed at [facility].  Out of respect for the patient and family no Important Message from [facility] given.  Olegario Messier A Donell Tomkins 04/14/2020, 8:07 AM

## 2020-04-14 NOTE — Plan of Care (Signed)

## 2020-04-14 NOTE — Progress Notes (Signed)
Daily Progress Note   Patient Name: Travis Craig       Date: 04/14/2020 DOB: 1940/09/14  Age: 79 y.o. MRN#: [id number] Attending Physician: Lanae Boast, MD Primary Care Physician: Smitty Cords, DO Admit Date: 04/04/2020  Reason for Consultation/Follow-up: Establishing goals of care  Subjective: Patient is resting in bed. He is alert and calm. He is confused discussing things from his past, when he was younger. Wife is at bedside. Patient wants vanilla ice cream; it was provided and patient began coughing with 1st bite. He tried a second bite and coughed as well. He smiles at his wife and tries to converse with her. He requests to allow him to finish what he is saying s his wife attempts to redirect him.    She is concerned that he looks better than he did yesterday. She asked several ways about his thoughts on death and dying. I asked several questions as well. Patient states there is no point in continuing life prolonging measures. Wife discusses patient talking about his father's feelings on death in the past. She states he told her his father was greatly at peace with death stating everyone has to die at some point, and your time will come around. Patient told us "there is no point in continuing on" when asked about life prolonging care, and stated "you can't fix me."  Wife is at peace with continuing plan for comfort. His mittens were removed. No sitter at bedside.    Length of Stay: 10  Current Medications: Scheduled Meds:  . ipratropium  2 spray Each Nare TID  . sodium chloride flush  3 mL Intravenous Q12H  . ziprasidone  10 mg Intramuscular QHS    Continuous Infusions:   PRN Meds: acetaminophen **OR** acetaminophen, antiseptic oral rinse, glycopyrrolate **OR**  glycopyrrolate, [DISCONTINUED] haloperidol **OR** haloperidol lactate, LORazepam **OR** [DISCONTINUED] LORazepam, LORazepam, morphine injection, ondansetron (ZOFRAN) IV, ondansetron **OR** ondansetron (ZOFRAN) IV, polyvinyl alcohol, sodium chloride flush  Physical Exam Pulmonary:     Effort: Pulmonary effort is normal.  Skin:    General: Skin is warm and dry.  Neurological:     Mental Status: He is alert.             Vital Signs: BP 126/76 (BP Location: Left Arm)   Pulse (!) 54   Temp 98.4 F (  36.9 C)   Resp 16   Ht 5\' 11"  (1.803 m)   Wt 80.3 kg   SpO2 97%   BMI 24.69 kg/m  SpO2: SpO2: 97 % O2 Device: O2 Device: Room Air O2 Flow Rate:    Intake/output summary:   Intake/Output Summary (Last 24 hours) at 04/14/2020 1053 Last data filed at 04/13/2020 1836 Gross per 24 hour  Intake --  Output 350 ml  Net -350 ml   LBM: Last BM Date: 04/03/20 Baseline Weight: Weight: 83.5 kg Most recent weight: Weight: 80.3 kg       Palliative Assessment/Data:    Flowsheet Rows   Flowsheet Row Most Recent Value  Intake Tab   Referral Department Hospitalist  Unit at Time of Referral Med/Surg Unit  Palliative Care Primary Diagnosis Neurology  Date Notified 04/10/20  Palliative Care Type New Palliative care  Reason for referral Clarify Goals of Care  Date of Admission 04/04/20  Date first seen by Palliative Care 04/11/20  # of days Palliative referral response time 1 Day(s)  # of days IP prior to Palliative referral 6  Clinical Assessment   Psychosocial & Spiritual Assessment   Palliative Care Outcomes       Patient Active Problem List   Diagnosis Date Noted  . Elevated troponin 04/04/2020  . Fall at home, initial encounter 04/04/2020  . Rhabdomyolysis 04/04/2020  . Hypokalemia 04/04/2020  . Thrombocytopenia ([place]) 04/04/2020  . Diarrhea 04/04/2020  . Right rib fracture 04/04/2020  . Lactic acidosis 04/04/2020  . Fever 04/04/2020  . Orbital floor fracture ([place])  04/04/2020  . Pterygoid plate fracture ([place]) 04/04/2020  . Abnormal LFTs 04/04/2020  . Acute metabolic encephalopathy 12/45/8099  . UTI (urinary tract infection) 04/04/2020  . Elevated hemoglobin A1c 07/06/2019  . Dementia ([place]) 01/07/2019  . 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 Hyponatremia 05/13/2017  . 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 Anemia 03/22/2017  . Essential hypertension 02/29/2016  . Mixed hyperlipidemia 02/29/2016    Palliative Care Assessment & Plan    Recommendations/Plan:  NPO excepts sucking water from oral swab.     Code Status:    Code Status Orders  (From admission, onward)         Start     Ordered   04/13/20 1249  Do not attempt resuscitation (DNR)  Continuous       Question Answer Comment  In the event of cardiac or respiratory ARREST Do not call a "code blue"   In the event of cardiac or respiratory ARREST Do not perform Intubation, CPR, defibrillation or ACLS   In the event of cardiac or respiratory ARREST Use medication by any route, position, wound care, and other measures to relive pain and suffering. May use oxygen, suction and manual treatment of airway obstruction as needed for comfort.   Comments MOST form in chart.      04/13/20 1251        Code Status History    Date Active Date Inactive Code Status Order ID Comments User Context   04/05/2020 0100 04/13/2020 1247 Full Code [id number]  Ivor Costa, MD Inpatient   04/04/2020 1453 04/05/2020 0100 DNR [id number]  Ivor Costa, MD ED   Advance Care Planning Activity    Advance Directive Documentation   Flowsheet Row Most Recent Value  Type of Advance Directive Healthcare Power of Attorney  Pre-existing out of facility DNR order (yellow form or pink MOST form) --  "MOST" Form in Place? --  Prognosis:   < 2 weeks    Care plan was discussed with RN  Thank you for allowing the Palliative Medicine Team to assist in the care of  this patient.   Time In: 9:40 Time Out: 10:30 Total Time 50 min Prolonged Time Billed  no      Greater than 50%  of this time was spent counseling and coordinating care related to the above assessment and plan.  Morton Stall, NP  Please contact Palliative Medicine Team phone at [phone] for questions and concerns.

## 2020-04-14 NOTE — Progress Notes (Signed)
PROGRESS NOTE    Travis Craig  KZS:[id number] DOB: 28-May-1940 DOA: 04/04/2020 PCP: Smitty Cords, DO   Chief Complaint  Patient presents with  . Fall    Brief Narrative: 79 year old male with significant history of hypertension, hyperlipidemia, hard of hearing, dementia, hyponatremia presented after fall sustaining facial and right-sided rib fractures, and found to have rhabdomyolysis and UTI.  Patient has been having significant behavioral disturbance with agitation and combativeness when awake in the setting of his dementia.  He was seen in the ED and was admitted.  Seen by ophthalmology and ENT with no surgical intervention needed for facial fractures.  He grew E. coli and completed IV antibiotic course.  His rhabdomyolysis resolved with IV fluid hydration.ENT recommendations: Soft diet, avoid blowing nose. 12/27 pt had large clump of ?organized clot from his throat - sent for pathology. Per ENT likely organized blood clot from facial trauma He has been having significant agitation and combativeness with delirium on top of dementia, continues to decline and has not made much improvement, palliative care has been involved and there is ongoing discussion about comfort measures. Palliative care had meeting with family 12/29 and family and patient opting for comfort  Subjective: Seen/examined this am Patient's wife and neighbors in the room  Appears more alert awake today but not conversant.   He is not agitated today.  Assessment & Plan:  Fall with fractures orbital floor fracture/pterygoid plate fracture/right rib fracture: No surgical intervention needed at this time continue supportive measures pain control.  Continue supportive measures.    Acute metabolic encephalopathy in the setting of dementia with behavioral disturbance: He does somewhat better with wife present at bedside.  Today he is much more alert awake.  Still not able to take orally.Continue delirium  precaution sedative medication as needed-with Zyprexa/Haldol/Risperdal.  Palliative care following. speech following-not safe for any thing by mouth.  Traumatic rhabdomyolysis resolved with IV fluids.  Encourage hydration E. coli UTI status post ceftriaxone Hypokalemia from poor oral intake Hypernatremia due to poor PO intake resolved with IV hypotonic fluids.  Elevated AST/ALT/bilirubin-likely due to rhabdomyolysis.  Right upper quadrant ultrasound unremarkable. Elevated troponin - attributed to demand ischemia in setting of fall, infection.  Cardiology was consulted, signed off.EKG and Echo were unremarkable. Due to dementia, not good candidate for ischemic evaluation.    Essential Hpertension: Blood pressure stable. On amlodipine  Hyperlipidemia -statin on hold due to rhabdo.  Thrombocytopenia  resolved.  Diarrhea - resolved.  Lactic acidosis and dehydration resolved.  Goal of care: is DNR, overall prognosis remains poor patient still unable to take anything by mouth, he appears to have good and bad days as he is more alert awake and calm today.  No agitated.  Palliative care has been following very closely and has had ongoing family discussion.  Again family had a meeting today and based on patient's wishes and family's wishes opting for hospice care.  Nutrition: Diet Order            Diet NPO time specified  Diet effective now                 Nutrition Problem: Inadequate oral intake Etiology: inability to eat Signs/Symptoms: NPO status Interventions: Refer to RD note for recommendations  Body mass index is 24.69 kg/m.  DVT prophylaxis:  Code Status:   Code Status: DNR  Family Communication: plan of care discussed with patient's wife at bedside.  Status is: Inpatient  Remains inpatient appropriate because:IV treatments appropriate due  to intensity of illness or inability to take PO and Inpatient level of care appropriate due to severity of  illness   Dispo:  Patient From: Home  Planned Disposition: hospice once bed available  Expected discharge date: 04/15/2020  Medically stable for discharge: No  Consultants:see note  Procedures:see note  Culture/Microbiology    Component Value Date/Time   SDES  04/04/2020 1200    URINE, RANDOM Performed at [facility], [address] [place], [place] [address]    [facility]  04/04/2020 1200    NONE Performed at [facility], [address] [place], [place] [address]    CULT >=100,000 COLONIES/mL ESCHERICHIA COLI (A) 04/04/2020 1200   REPTSTATUS 04/07/2020 FINAL 04/04/2020 1200    Other culture-see note  Medications: Scheduled Meds: . ipratropium  2 spray Each Nare TID  . sodium chloride flush  3 mL Intravenous Q12H  . ziprasidone  10 mg Intramuscular QHS   Continuous Infusions:   Antimicrobials: Anti-infectives (From admission, onward)   Start     Dose/Rate Route Frequency Ordered Stop   04/08/20 1800  cefTRIAXone (ROCEPHIN) 1 g in sodium chloride 0.9 % 100 mL IVPB        1 g 200 mL/hr over 30 Minutes Intravenous Every 24 hours 04/08/20 1316 04/10/20 1744   04/04/20 1800  cefTRIAXone (ROCEPHIN) 1 g in sodium chloride 0.9 % 100 mL IVPB  Status:  Discontinued        1 g 200 mL/hr over 30 Minutes Intravenous Every 24 hours 04/04/20 1652 04/08/20 1316     Objective: Vitals: Today's Vitals   04/13/20 1511 04/13/20 2200 04/13/20 2325 04/14/20 1100  BP:   126/76   Pulse:   (!) 54   Resp:   16   Temp:   98.4 F (36.9 C)   TempSrc:      SpO2:   97%   Weight:      Height:      PainSc: Asleep 9   0-No pain    Intake/Output Summary (Last 24 hours) at 04/14/2020 1319 Last data filed at 04/13/2020 1836 Gross per 24 hour  Intake --  Output 350 ml  Net -350 ml   Filed Weights   04/05/20 0332 04/08/20 0328 04/10/20 0340  Weight: 79.4 kg 83.5 kg 80.3 kg   Weight change:   Intake/Output from previous day: 12/29 0701 - 12/30 0700 In: -   Out: 350 [Urine:350] Intake/Output this shift: No intake/output data recorded.  Examination: General exam:Alert awake, follows some commands.   HEENT:Oral mucosa moist, Ear/Nose WNL grossly, dentition normal. Respiratory system: bilaterally clear,no wheezing or crackles,no use of accessory muscle. Cardiovascular system: S1 & S2 +, No JVD. Gastrointestinal system: Abdomen soft, NT,ND, BS+. Nervous System:Alert, awake, moving extremities. Extremities: No edema, distal peripheral pulses palpable.  Skin: No rashes,no icterus.  Bruise present on the face. MSK: Normal muscle bulk,tone, power.  Data Reviewed: I have personally reviewed following labs and imaging studies CBC: No results for input(s): WBC, NEUTROABS, HGB, HCT, MCV, PLT in the last 168 hours. Basic Metabolic Panel: Recent Labs  Lab 04/09/20 0538 04/10/20 0417 04/11/20 0424 04/12/20 0616 04/13/20 0700  NA 149* 148* 144 148* 146*  K 3.1* 2.7* 3.4* 3.6 4.0  CL 115* 113* 114* 114* 115*  CO2 24 25 24 27 25   GLUCOSE 110* 131* 117* 121* 128*  BUN 25* 18 17 23 19   CREATININE 0.72 0.59* 0.64 0.64 0.60*  CALCIUM 8.7* 8.4* 8.5* 8.8* 9.1  MG  --   --   --   --  1.9  PHOS  --   --   --   --  3.0   GFR: Estimated Creatinine Clearance: 78.4 mL/min (A) (by C-G formula based on SCr of 0.6 mg/dL (L)). Liver Function Tests: Recent Labs  Lab 04/08/20 0810 04/09/20 0538 04/10/20 0417 04/11/20 0424 04/12/20 0616  AST 152* 134* 125* 96* 79*  ALT 71* 70* 73* 61* 54*  ALKPHOS 57 53 52 58 63  BILITOT 2.0* 2.0* 1.6* 1.3* 1.7*  PROT 6.4* 6.3* 6.7 5.9* 6.1*  ALBUMIN 3.1* 3.0* 3.2* 2.9* 2.8*   No results for input(s): LIPASE, AMYLASE in the last 168 hours. Recent Labs  Lab 04/09/20 0538  AMMONIA 13   Coagulation Profile: No results for input(s): INR, PROTIME in the last 168 hours. Cardiac Enzymes: Recent Labs  Lab 04/08/20 0810 04/09/20 0538 04/10/20 0417 04/11/20 0424 04/12/20 0616  CKTOTAL 625* 492* 442* 313 205    BNP (last 3 results) No results for input(s): PROBNP in the last 8760 hours. HbA1C: No results for input(s): HGBA1C in the last 72 hours. CBG: No results for input(s): GLUCAP in the last 168 hours. Lipid Profile: No results for input(s): CHOL, HDL, LDLCALC, TRIG, CHOLHDL, LDLDIRECT in the last 72 hours. Thyroid Function Tests: No results for input(s): TSH, T4TOTAL, FREET4, T3FREE, THYROIDAB in the last 72 hours. Anemia Panel: No results for input(s): VITAMINB12, FOLATE, FERRITIN, TIBC, IRON, RETICCTPCT in the last 72 hours. Sepsis Labs: No results for input(s): PROCALCITON, LATICACIDVEN in the last 168 hours.  No results found for this or any previous visit (from the past 240 hour(s)).   Radiology Studies: No results found.   LOS: 10 days   Antonieta Pert, MD [facility]  04/14/2020, 1:19 PM

## 2020-04-14 NOTE — Progress Notes (Signed)
[facility] Liaison note:  Follow up visit made to new referral for [facility] hospice home. Patient seen lying in bed, awake, talking about a test with 75 words on it, he appeared calm, wife Steward Drone at bedside.  Steward Drone and hospital care team made aware that [facility] does not have bed availability today. Liaison to follow and update daily or sooner with bed availability.  Dayna Barker, BSN, RN, [facility] [employer] [phone]

## 2020-04-15 MED ORDER — ATROPINE SULFATE 1 % OP SOLN
2.0000 [drp] | Freq: Four times a day (QID) | OPHTHALMIC | Status: DC
  Administered 2020-04-15 – 2020-04-16 (×4): 2 [drp] via SUBLINGUAL
  Filled 2020-04-15: qty 2

## 2020-04-15 NOTE — Progress Notes (Addendum)
[facility] Liaison note:  Follow up visit made to new referral for [facility] hospice home. Patient seen lying in bed, son Raiford Noble at bedside. Per chart review and conversation with Raiford Noble, patient was restless/agitated earlier this morning and has received PRN ativan and haldol as well as IV morphine. Secretions noted and discussed with Raiford Noble and staff RN Lauren.  Rick and hospital care team are aware that at this time, [facility] does not have bed availability. Liaison to update through out the day regarding bed availability.  4:20PM UPDATE if as bed becomes available over the weekend, the hospice home staff will contact the weekend TOC. Family aware. Thank you.  Dayna Barker BSN, RN, [facility] [employer] [phone]

## 2020-04-15 NOTE — Plan of Care (Signed)
  Problem: Education: Goal: Knowledge of General Education information will improve Description: Including pain rating scale, medication(s)/side effects and non-pharmacologic comfort measures Outcome: Progressing   Problem: Health Behavior/Discharge Planning: Goal: Ability to manage health-related needs will improve Outcome: Progressing   Problem: Clinical Measurements: Goal: Ability to maintain clinical measurements within normal limits will improve Outcome: Progressing Goal: Will remain free from infection Outcome: Progressing Goal: Diagnostic test results will improve Outcome: Progressing Goal: Respiratory complications will improve Outcome: Progressing Goal: Cardiovascular complication will be avoided Outcome: Progressing   Problem: Activity: Goal: Risk for activity intolerance will decrease Outcome: Progressing   Problem: Coping: Goal: Level of anxiety will decrease Outcome: Progressing   Problem: Elimination: Goal: Will not experience complications related to bowel motility Outcome: Progressing Goal: Will not experience complications related to urinary retention Outcome: Progressing   Problem: Pain Managment: Goal: General experience of comfort will improve Outcome: Progressing   Problem: Safety: Goal: Ability to remain free from injury will improve Outcome: Progressing   Problem: Skin Integrity: Goal: Risk for impaired skin integrity will decrease Outcome: Progressing   Problem: Urinary Elimination: Goal: Signs and symptoms of infection will decrease Outcome: Progressing

## 2020-04-15 NOTE — Progress Notes (Addendum)
Daily Progress Note   Patient Name: Travis Craig       Date: 04/15/2020 DOB: 1940-06-26  Age: 79 y.o. MRN#: [id number] Attending Physician: Lanae Boast, MD Primary Care Physician: Smitty Cords, DO Admit Date: 04/04/2020  Reason for Consultation/Follow-up: Establishing goals of care  Subjective: Patient is resting in bed.  Wife was grateful for his search and the good day that they had yesterday.  She notes that today he is returned to his status 2 days ago.  Son entered room. Questions answered about taking patient home with hospice, and family decided to continue the course with hospice facility placement.    Length of Stay: 11  Current Medications: Scheduled Meds:  . chlorhexidine  15 mL Mouth Rinse BID  . ipratropium  2 spray Each Nare TID  . mouth rinse  15 mL Mouth Rinse q12n4p  . sodium chloride flush  3 mL Intravenous Q12H  . ziprasidone  10 mg Intramuscular QHS    Continuous Infusions:   PRN Meds: acetaminophen **OR** acetaminophen, antiseptic oral rinse, glycopyrrolate **OR** glycopyrrolate, [DISCONTINUED] haloperidol **OR** haloperidol lactate, LORazepam **OR** [DISCONTINUED] LORazepam, LORazepam, morphine injection, ondansetron (ZOFRAN) IV, ondansetron **OR** ondansetron (ZOFRAN) IV, polyvinyl alcohol, sodium chloride flush  Physical Exam Pulmonary:     Effort: Pulmonary effort is normal.  Skin:    General: Skin is warm and dry.     Comments: Feet are warm to the touch  Neurological:     Mental Status: He is alert.             Vital Signs: BP 126/76 (BP Location: Left Arm)   Pulse (!) 54   Temp 98.4 F (36.9 C)   Resp 16   Ht 5\' 11"  (1.803 m)   Wt 80.3 kg   SpO2 97%   BMI 24.69 kg/m  SpO2: SpO2: 97 % O2 Device: O2 Device: Room Air O2  Flow Rate:    Intake/output summary:   Intake/Output Summary (Last 24 hours) at 04/15/2020 1237 Last data filed at 04/15/2020 0500 Gross per 24 hour  Intake --  Output 1250 ml  Net -1250 ml   LBM: Last BM Date: 04/03/20 Baseline Weight: Weight: 83.5 kg Most recent weight: Weight: 80.3 kg       Palliative Assessment/Data:    04/05/20 Row Most Recent  Value  Intake Tab   Referral Department Hospitalist  Unit at Time of Referral Med/Surg Unit  Palliative Care Primary Diagnosis Neurology  Date Notified 04/10/20  Palliative Care Type New Palliative care  Reason for referral Clarify Goals of Care  Date of Admission 04/04/20  Date first seen by Palliative Care 04/11/20  # of days Palliative referral response time 1 Day(s)  # of days IP prior to Palliative referral 6  Clinical Assessment   Psychosocial & Spiritual Assessment   Palliative Care Outcomes       Patient Active Problem List   Diagnosis Date Noted  . Elevated troponin 04/04/2020  . Fall at home, initial encounter 04/04/2020  . Rhabdomyolysis 04/04/2020  . Hypokalemia 04/04/2020  . Thrombocytopenia ([place]) 04/04/2020  . Diarrhea 04/04/2020  . Right rib fracture 04/04/2020  . Lactic acidosis 04/04/2020  . Fever 04/04/2020  . Orbital floor fracture ([place]) 04/04/2020  . Pterygoid plate fracture ([place]) 04/04/2020  . Abnormal LFTs 04/04/2020  . Acute metabolic encephalopathy [id number]  . UTI (urinary tract infection) 04/04/2020  . Elevated hemoglobin A1c 07/06/2019  . Dementia ([place]) 01/07/2019  . 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 Hyponatremia 05/13/2017  . 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 Anemia 03/22/2017  . Essential hypertension 02/29/2016  . Mixed hyperlipidemia 02/29/2016    Palliative Care Assessment & Plan    Recommendations/Plan:  Comfort comfort care waiting for hospice facility placement    Code Status:    Code Status  Orders  (From admission, onward)         Start     Ordered   04/13/20 1249  Do not attempt resuscitation (DNR)  Continuous       Question Answer Comment  In the event of cardiac or respiratory ARREST Do not call a "code blue"   In the event of cardiac or respiratory ARREST Do not perform Intubation, CPR, defibrillation or ACLS   In the event of cardiac or respiratory ARREST Use medication by any route, position, wound care, and other measures to relive pain and suffering. May use oxygen, suction and manual treatment of airway obstruction as needed for comfort.   Comments MOST form in chart.      04/13/20 1251        Code Status History    Date Active Date Inactive Code Status Order ID Comments User Context   04/05/2020 0100 04/13/2020 1247 Full Code [id number]  Ivor Costa, MD Inpatient   04/04/2020 1453 04/05/2020 0100 DNR [id number]  Ivor Costa, MD ED   Advance Care Planning Activity    Advance Directive Documentation   Flowsheet Row Most Recent Value  Type of Advance Directive Healthcare Power of Attorney  Pre-existing out of facility DNR order (yellow form or pink MOST form) --  "MOST" Form in Place? --       Prognosis:   < 2 weeks   Thank you for allowing the Palliative Medicine Team to assist in the care of this patient.  Plan discussed with primary MD.    Time In: Total Time 15 min Prolonged Time Billed  no      Greater than 50%  of this time was spent counseling and coordinating care related to the above assessment and plan.  Asencion Gowda, NP  Please contact Palliative Medicine Team phone at [phone] for questions and concerns.

## 2020-04-15 NOTE — Progress Notes (Signed)
PROGRESS NOTE    Travis Craig  [id number] DOB: 01-Aug-1940 DOA: 04/04/2020 PCP: Olin Hauser, DO   Chief Complaint  Patient presents with  . Fall    Brief Narrative: 79 year old male with significant history of hypertension, hyperlipidemia, hard of hearing, dementia, hyponatremia presented after fall sustaining facial and right-sided rib fractures, and found to have rhabdomyolysis and UTI.  Patient has been having significant behavioral disturbance with agitation and combativeness when awake in the setting of his dementia.  He was seen in the ED and was admitted.  Seen by ophthalmology and ENT with no surgical intervention needed for facial fractures.  He grew E. coli and completed IV antibiotic course.  His rhabdomyolysis resolved with IV fluid hydration.ENT recommendations: Soft diet, avoid blowing nose. 12/27 pt had large clump of ?organized clot from his throat - sent for pathology. Per ENT likely organized blood clot from facial trauma He has been having significant agitation and combativeness with delirium on top of dementia, continues to decline and has not made much improvement, palliative care has been involved and there is ongoing discussion about comfort measures. Palliative care had meeting with family 12/29 and family and patient opting for comfort Patient remains on comfort measures.  Intermittent episodes of agitation and being managed with morphine and Haldol.  Subjective: Wife and son at the bedside, had good day yesterday.  Has returned to his status 2 days ago, earlier this morning was agitated needing Haldol and morphine. Currently, eyes closed sleeping. Minimal oral intake  Assessment & Plan:  Comfort measures goal of care: DNR, overall prognosis remains poor.  Sleepy, had received Haldol morphine for agitation earlier today.  Continue on comfort measures as per palliative care meeting with family and patient.  Awaiting for bed at inpatient hospice.   Patient with  minimal intake and decreased urine output prognosis appears to be poor.  Well appointment  Fall with fractures orbital floor fracture/pterygoid plate fracture/right rib fracture: No surgical intervention needed at this time continue supportive measures pain control.  Continue supportive measures.    Acute metabolic encephalopathy in the setting of dementia with behavioral disturbance: He does somewhat better with wife present at bedside.  Currently rested eyes closed sleepy after Haldol and Ativan.  Continue to focus on comfort.  Continue to comfort medications as ordered and appreciate palliative care management on board.   Traumatic rhabdomyolysis resolved with IV fluids. E. coli UTI completed ceftriaxone. Hypokalemia from poor oral intake. Hypernatremia due to poor PO intake resolved with IV hypotonic fluids. Elevated AST/ALT/bilirubin-likely due to rhabdomyolysis.  Right upper quadrant ultrasound unremarkable. Elevated troponin - attributed to demand ischemia in setting of fall, infection.  Cardiology was consulted, signed off.EKG and Echo were unremarkable. Due to dementia, not good candidate for ischemic evaluation.   Essential Hpertension: BP stable.  Po Meds discontinued for comfort Hyperlipidemia -statin  Held due to rhabdo. Thrombocytopenia  resolved. Diarrhea - resolved. Lactic acidosis and dehydration -treated with IV fluids.  Nutrition: Diet Order            Diet NPO time specified  Diet effective now                 Nutrition Problem: Inadequate oral intake Etiology: inability to eat Signs/Symptoms: NPO status Interventions: Refer to RD note for recommendations  Body mass index is 24.69 kg/m.  DVT prophylaxis:  Code Status:   Code Status: DNR  Family Communication: plan of care discussed with patient's wife at bedside.  Status is: Inpatient  Remains inpatient appropriate because:IV treatments appropriate due to intensity of illness or inability to  take PO and Inpatient level of care appropriate due to severity of illness   Dispo:  Patient From: Home  Planned Disposition: hospice once bed available  Expected discharge date: 04/15/2020  Medically stable for discharge: No  Consultants:see note  Procedures:see note  Culture/Microbiology    Component Value Date/Time   SDES  04/04/2020 1200    URINE, RANDOM Performed at [facility], [address] [place], [place] [address]    [facility]  04/04/2020 1200    NONE Performed at [facility], [address] [place],  [address]    CULT >=100,000 COLONIES/mL ESCHERICHIA COLI (A) 04/04/2020 1200   REPTSTATUS 04/07/2020 FINAL 04/04/2020 1200    Other culture-see note  Medications: Scheduled Meds: . chlorhexidine  15 mL Mouth Rinse BID  . ipratropium  2 spray Each Nare TID  . mouth rinse  15 mL Mouth Rinse q12n4p  . sodium chloride flush  3 mL Intravenous Q12H  . ziprasidone  10 mg Intramuscular QHS   Continuous Infusions:   Antimicrobials: Anti-infectives (From admission, onward)   Start     Dose/Rate Route Frequency Ordered Stop   04/08/20 1800  cefTRIAXone (ROCEPHIN) 1 g in sodium chloride 0.9 % 100 mL IVPB        1 g 200 mL/hr over 30 Minutes Intravenous Every 24 hours 04/08/20 1316 04/10/20 1744   04/04/20 1800  cefTRIAXone (ROCEPHIN) 1 g in sodium chloride 0.9 % 100 mL IVPB  Status:  Discontinued        1 g 200 mL/hr over 30 Minutes Intravenous Every 24 hours 04/04/20 1652 04/08/20 1316     Objective: Vitals: Today's Vitals   04/13/20 2325 04/14/20 1100 04/15/20 0543 04/15/20 0800  BP: 126/76     Pulse: (!) 54     Resp: 16     Temp: 98.4 F (36.9 C)     TempSrc:      SpO2: 97%     Weight:      Height:      PainSc:  0-No pain Asleep Asleep    Intake/Output Summary (Last 24 hours) at 04/15/2020 1313 Last data filed at 04/15/2020 0500 Gross per 24 hour  Intake --  Output 1250 ml  Net -1250 ml   Filed Weights    04/05/20 0332 04/08/20 0328 04/10/20 0340  Weight: 79.4 kg 83.5 kg 80.3 kg   Weight change:   Intake/Output from previous day: 12/30 0701 - 12/31 0700 In: -  Out: 1250 [Urine:1250] Intake/Output this shift: No intake/output data recorded.  Examination: General exam: Sleepy/sedated, noncommunicative.  Appears calm and rested.   HEENT:Oral mucosa moist, Ear/Nose WNL grossly, dentition normal.  Mucosa dry. Respiratory system: bilaterally clear,no wheezing or crackles,no use of accessory muscle Cardiovascular system: S1 & S2 +, No JVD,. Gastrointestinal system: Abdomen soft, BS+ Nervous System: Sleepy/sedated,.  Extremities: No edema, distal peripheral pulses palpable.  Skin: No rashes,no icterus. MSK: Normal muscle bulk,tone, power Data Reviewed: I have personally reviewed following labs and imaging studies CBC: No results for input(s): WBC, NEUTROABS, HGB, HCT, MCV, PLT in the last 168 hours. Basic Metabolic Panel: Recent Labs  Lab 04/09/20 0538 04/10/20 0417 04/11/20 0424 04/12/20 0616 04/13/20 0700  NA 149* 148* 144 148* 146*  K 3.1* 2.7* 3.4* 3.6 4.0  CL 115* 113* 114* 114* 115*  CO2 24 25 24 27 25   GLUCOSE 110* 131* 117* 121* 128*  BUN 25*  18 17 23 19   CREATININE 0.72 0.59* 0.64 0.64 0.60*  CALCIUM 8.7* 8.4* 8.5* 8.8* 9.1  MG  --   --   --   --  1.9  PHOS  --   --   --   --  3.0   GFR: Estimated Creatinine Clearance: 78.4 mL/min (A) (by C-G formula based on SCr of 0.6 mg/dL (L)). Liver Function Tests: Recent Labs  Lab 04/09/20 0538 04/10/20 0417 04/11/20 0424 04/12/20 0616  AST 134* 125* 96* 79*  ALT 70* 73* 61* 54*  ALKPHOS 53 52 58 63  BILITOT 2.0* 1.6* 1.3* 1.7*  PROT 6.3* 6.7 5.9* 6.1*  ALBUMIN 3.0* 3.2* 2.9* 2.8*   No results for input(s): LIPASE, AMYLASE in the last 168 hours. Recent Labs  Lab 04/09/20 0538  AMMONIA 13   Coagulation Profile: No results for input(s): INR, PROTIME in the last 168 hours. Cardiac Enzymes: Recent Labs  Lab  04/09/20 0538 04/10/20 0417 04/11/20 0424 04/12/20 0616  CKTOTAL 492* 442* 313 205   BNP (last 3 results) No results for input(s): PROBNP in the last 8760 hours. HbA1C: No results for input(s): HGBA1C in the last 72 hours. CBG: No results for input(s): GLUCAP in the last 168 hours. Lipid Profile: No results for input(s): CHOL, HDL, LDLCALC, TRIG, CHOLHDL, LDLDIRECT in the last 72 hours. Thyroid Function Tests: No results for input(s): TSH, T4TOTAL, FREET4, T3FREE, THYROIDAB in the last 72 hours. Anemia Panel: No results for input(s): VITAMINB12, FOLATE, FERRITIN, TIBC, IRON, RETICCTPCT in the last 72 hours. Sepsis Labs: No results for input(s): PROCALCITON, LATICACIDVEN in the last 168 hours.  No results found for this or any previous visit (from the past 240 hour(s)).   Radiology Studies: No results found.   LOS: 11 days   04/14/20, MD [facility]  04/15/2020, 1:13 PM

## 2020-04-15 NOTE — Progress Notes (Signed)
Robinul PRN not effective patient has secretions, ujnable to suction them ,

## 2020-04-16 MED ORDER — GLYCOPYRROLATE 0.2 MG/ML IJ SOLN: 0.2000 mg | INTRAMUSCULAR | Status: AC | PRN

## 2020-04-16 MED ORDER — ZIPRASIDONE MESYLATE 20 MG IM SOLR: 10.0000 mg | Freq: Every day | INTRAMUSCULAR | Status: AC

## 2020-04-16 MED ORDER — POLYVINYL ALCOHOL 1.4 % OP SOLN: 1.0000 [drp] | Freq: Four times a day (QID) | OPHTHALMIC | 0 refills | Status: AC | PRN

## 2020-04-16 MED ORDER — IPRATROPIUM BROMIDE 0.06 % NA SOLN: 2.0000 | Freq: Three times a day (TID) | NASAL | 12 refills | Status: AC

## 2020-04-16 MED ORDER — LORAZEPAM 2 MG/ML PO CONC: 1.0000 mg | ORAL | 0 refills | Status: AC | PRN

## 2020-04-16 MED ORDER — MORPHINE SULFATE (PF) 2 MG/ML IV SOLN: 1.0000 mg | INTRAVENOUS | 0 refills | Status: AC | PRN

## 2020-04-16 MED ORDER — ATROPINE SULFATE 1 % OP SOLN: 2.0000 [drp] | Freq: Four times a day (QID) | OPHTHALMIC | 12 refills | Status: AC

## 2020-04-16 NOTE — Discharge Summary (Signed)
Physician Discharge Summary  Travis Craig [id number] DOB: [birth year] DOA: 04/04/2020  PCP: Olin Hauser, DO  Admit date: 04/04/2020 Discharge date: 04/16/2020  Admitted From: home Disposition:  hospice  Recommendations for Outpatient Follow-up:  1. Follow up with PCP in 1-2 weeks 2. Please obtain BMP/CBC in one week 3. Please follow up on the following pending results:  Home Health:no  Equipment/Devices: none  Discharge Condition: Stable Code Status:   Code Status: DNR Diet recommendation:  Diet Order            Diet NPO time specified  Diet effective now                  Brief/Interim Summary:  80 year old male with significant history of hypertension, hyperlipidemia, hard of hearing, dementia, hyponatremia presented after fall sustaining facial and right-sided rib fractures, and found to have rhabdomyolysis and UTI.  Patient has been having significant behavioral disturbance with agitation and combativeness when awake in the setting of his dementia.  He was seen in the ED and was admitted.  Seen by ophthalmology and ENT with no surgical intervention needed for facial fractures.  He grew E. coli and completed IV antibiotic course.  His rhabdomyolysis resolved with IV fluid hydration.ENT recommendations: Soft diet, avoid blowing nose. 12/27 pt had large clump of ?organized clot from his throat - sent for pathology. Per ENT likely organized blood clot from facial trauma He has been having significant agitation and combativeness with delirium on top of dementia, continues to decline and has not made much improvement, palliative care has been involved and there is ongoing discussion about comfort measures. Palliative care had meeting with family 12/29 and family and patient and patient opted for  comfort Patient remains on comfort measures He is Is declining, family has been at the bedside and he was waiting for hospice bed and bed is available today and family  agreeable for discharge to hospice house today.  Discharge Diagnoses:  Comfort measures goal of care: DNR, overall prognosis remains poor.  Sleepy, had received Haldol morphine for agitation earlier today.  Continue on comfort measures as per palliative care meeting with family and patient. Awaiting for bed at inpatient hospice and and has a bed available today and family agreeable with transfer to hospice facility today.  Fall with fractures orbital floor fracture/pterygoid plate fracture/right rib fracture: No surgical intervention needed at this time continue supportive measures pain control.  Continue supportive measures.    Acute metabolic encephalopathy in the setting of dementia with behavioral disturbance: He does somewhat better with wife present at bedside.   Responds to pain, he is on comfort care measures with opiates Ativan, cholinergics to help with secretion, as needed Haldol and zaprisodine.Continue to focus on comfort.Continue to comfort medications as ordered and appreciate palliative care management on board.   Episodes of fever overnight Acute hypoxic respiratory failure- pulse ox in 77% overnight-suspecting aspiration. Traumatic rhabdomyolysis resolved with IV fluids. E. coli UTI completed ceftriaxone. Hypokalemia from poor oral intake. Hypernatremiadue to poor PO intake resolved with IV hypotonic fluids. Elevated AST/ALT/bilirubin-likely due to rhabdomyolysis.  Right upper quadrant ultrasound unremarkable. Elevated troponin- attributed to demand ischemia in setting of fall, infection. Cardiology was consulted, signed off.EKG and Echo were unremarkable. Due to dementia, not good candidate for ischemic evaluation.  Essential Hpertension: BP stable.  Po Meds discontinued for comfort Hyperlipidemia-statin  Held due to rhabdo. Thrombocytopenia resolved. Diarrhea - resolved. Lactic acidosis and dehydration -treated with IV fluids.  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 Prognosis less than 2 weeks  Consults:  See note  Subjective: Patient is calm, breathing comfortably, not responding, on oxygen, had fever overnight.  Family at the bedside Discharge Exam: Vitals:   04/16/20 0646 04/16/20 0819  BP: 106/61 (!) 102/58  Pulse: 68 69  Resp: 16 (!) 22  Temp: (!) 102.1 F (38.9 C) (!) 102 F (38.9 C)  SpO2: (!) 89% 91%   General: Pt is sleepy responds to pain, breathing comfortably,.  No crackles.   Cardiovascular: RRR, S1/S2 +, no rubs, no gallops Respiratory: CTA bilaterally, no wheezing, no rhonchi Abdominal: Soft, NT, ND, bowel sounds + Extremities: no edema, no cyanosis  Discharge Instructions   Allergies as of 04/16/2020   No Known Allergies     Medication List    STOP taking these medications   albuterol 108 (90 Base) MCG/ACT inhaler Commonly known as: VENTOLIN HFA   aspirin EC 81 MG tablet   atorvastatin 20 MG tablet Commonly known as: LIPITOR   calcium-vitamin D 500-200 MG-UNIT tablet Commonly known as: OSCAL WITH D   CoQ10 100 MG Caps   fluticasone 50 MCG/ACT nasal spray Commonly known as: FLONASE   MULTI-VITAMIN DAILY PO   Omega-3 Fish Oil 1200 MG Caps   sertraline 50 MG tablet Commonly known as: ZOLOFT     TAKE these medications   atropine 1 % ophthalmic solution Place 2 drops under the tongue 4 (four) times daily.   glycopyrrolate 0.2 MG/ML injection Commonly known as: ROBINUL Inject 1 mL (0.2 mg total) into the skin every 4 (four) hours as needed (excessive secretions).   ipratropium 0.06 % nasal spray Commonly known as: ATROVENT Place 2 sprays into both nostrils 3 (three) times daily. What changed:   when to take this  reasons to take this   LORazepam 2 MG/ML concentrated solution Commonly known as: ATIVAN Place 0.5 mLs (1 mg total) under the tongue every 4 (four) hours as needed for anxiety.   morphine 2 MG/ML  injection Inject 0.5-1 mLs (1-2 mg total) into the vein every hour as needed (or dyspnea.  1mg  for moderate pain or dyspnea, 2 mg for severe pain or dyspnea.).   polyvinyl alcohol 1.4 % ophthalmic solution Commonly known as: LIQUIFILM TEARS Place 1 drop into both eyes 4 (four) times daily as needed for dry eyes.   ziprasidone injection Commonly known as: GEODON Inject 10 mg into the muscle at bedtime.       No Known Allergies  The results of significant diagnostics from this hospitalization (including imaging, microbiology, ancillary and laboratory) are listed below for reference.    Microbiology: No results found for this or any previous visit (from the past 240 hour(s)).  Procedures/Studies: DG Chest 1 View  Result Date: 04/04/2020 CLINICAL DATA:  Fall.  Pain. EXAM: CHEST  1 VIEW COMPARISON:  Chest x-ray 09/05/2017. FINDINGS: Calcified mediastinum hilar lymph nodes consistent prior granulomas disease again noted. Cardiomegaly. No pulmonary venous congestion. Persistent stable small density noted the right mid lung consistent with scarring. Persistent right mid pleuroparenchymal thickening consistent scarring. No acute infiltrate. No pleural effusion or pneumothorax. Prominent loops of bowel, most likely colon, again noted in the upper abdomen. Surgical clips upper abdomen. Slightly displaced right anterior 6 rib fracture noted. IMPRESSION: 1. Stable small density right mid lung consistent with scarring. Right mid lung pleural-parenchymal thickening consistent with scarring. Evidence of prior granulomas disease. No acute pulmonary  disease noted. 2.  Cardiomegaly.  No pulmonary venous congestion. 3.  Displaced right anterior 6 rib fracture noted.  No pneumothorax. Electronically Signed   By: Marcello Moores  Register   On: 04/04/2020 07:37   DG Pelvis 1-2 Views  Result Date: 04/04/2020 CLINICAL DATA:  Fall.  Pain. EXAM: PELVIS - 1-2 VIEW COMPARISON:  No prior. FINDINGS: Degenerative changes  lumbar spine and both hips. No acute bony abnormality. No evidence of fracture dislocation. Aortoiliac atherosclerotic vascular calcification. Pelvic calcifications consistent phleboliths. IMPRESSION: Degenerative changes lumbar spine and both hips. No acute abnormality. Electronically Signed   By: Marcello Moores  Register   On: 04/04/2020 07:25   CT Head Wo Contrast  Result Date: 04/04/2020 CLINICAL DATA:  Fall with facial trauma EXAM: CT HEAD WITHOUT CONTRAST CT MAXILLOFACIAL WITHOUT CONTRAST CT CERVICAL SPINE WITHOUT CONTRAST TECHNIQUE: Multidetector CT imaging of the head, cervical spine, and maxillofacial structures were performed using the standard protocol without intravenous contrast. Multiplanar CT image reconstructions of the cervical spine and maxillofacial structures were also generated. COMPARISON:  Head CT 04/02/2020 FINDINGS: CT HEAD FINDINGS Brain: No evidence of acute infarction, hemorrhage, hydrocephalus, extra-axial collection or mass lesion/mass effect. Age normal appearance of the brain. Vascular: No hyperdense vessel or unexpected calcification. Skull: Large right forehead hematoma.  No calvarial fracture. CT MAXILLOFACIAL FINDINGS Osseous: Minimally depressed right orbital floor fracture. Nondisplaced fractures of the right medial and lateral pterygoid plates with probable continuation along the wall of the right nasal cavity, but no complete LeFort injury. The mandible is intact and located. Large periapical erosion around tooth 3, dehiscent into the sinus. Orbits: Mild extraconal hemorrhage along the orbital floor fracture on the right. Bilateral cataract resection. Sinuses: Low-density/inflammatory appearing mucosal thickening at the right more than left maxillary sinus. Soft tissues: Large hematoma above the right orbit. CT CERVICAL SPINE FINDINGS Alignment: No traumatic malalignment Skull base and vertebrae: No acute fracture Soft tissues and spinal canal: No prevertebral fluid or swelling.  No visible canal hematoma. Disc levels:  Ordinary degenerative changes Upper chest: No evidence of injury IMPRESSION: 1. No evidence of intracranial or cervical spine injury. 2. Minimally depressed right orbital floor fracture. 3. Nondisplaced right pterygoid plate fractures. No complete LeFort injury. Electronically Signed   By: Monte Fantasia M.D.   On: 04/04/2020 07:20   CT Head Wo Contrast  Result Date: 04/02/2020 CLINICAL DATA:  Head trauma. EXAM: CT HEAD WITHOUT CONTRAST TECHNIQUE: Contiguous axial images were obtained from the base of the skull through the vertex without intravenous contrast. COMPARISON:  MRI head 12/07/2019. FINDINGS: Brain: No evidence of acute infarction, hemorrhage, hydrocephalus, extra-axial collection or mass lesion/mass effect. Similar cerebral volume loss with ex vacuo ventricular dilation. Mild for age patchy white matter hypoattenuation, likely related to chronic microvascular ischemic disease. Vascular: Calcific atherosclerosis. Skull: No acute fracture.  Right frontal scalp lipoma. Sinuses/Orbits: Mild scattered ethmoid air cell mucosal thickening. No visible air-fluid levels. Unremarkable visualized orbits. Other: No mastoid effusions. IMPRESSION: No evidence of acute intracranial abnormality. Electronically Signed   By: Margaretha Sheffield MD   On: 04/02/2020 07:25   CT Cervical Spine Wo Contrast  Result Date: 04/04/2020 CLINICAL DATA:  Fall with facial trauma EXAM: CT HEAD WITHOUT CONTRAST CT MAXILLOFACIAL WITHOUT CONTRAST CT CERVICAL SPINE WITHOUT CONTRAST TECHNIQUE: Multidetector CT imaging of the head, cervical spine, and maxillofacial structures were performed using the standard protocol without intravenous contrast. Multiplanar CT image reconstructions of the cervical spine and maxillofacial structures were also generated. COMPARISON:  Head CT 04/02/2020 FINDINGS: CT HEAD  FINDINGS Brain: No evidence of acute infarction, hemorrhage, hydrocephalus, extra-axial  collection or mass lesion/mass effect. Age normal appearance of the brain. Vascular: No hyperdense vessel or unexpected calcification. Skull: Large right forehead hematoma.  No calvarial fracture. CT MAXILLOFACIAL FINDINGS Osseous: Minimally depressed right orbital floor fracture. Nondisplaced fractures of the right medial and lateral pterygoid plates with probable continuation along the wall of the right nasal cavity, but no complete LeFort injury. The mandible is intact and located. Large periapical erosion around tooth 3, dehiscent into the sinus. Orbits: Mild extraconal hemorrhage along the orbital floor fracture on the right. Bilateral cataract resection. Sinuses: Low-density/inflammatory appearing mucosal thickening at the right more than left maxillary sinus. Soft tissues: Large hematoma above the right orbit. CT CERVICAL SPINE FINDINGS Alignment: No traumatic malalignment Skull base and vertebrae: No acute fracture Soft tissues and spinal canal: No prevertebral fluid or swelling. No visible canal hematoma. Disc levels:  Ordinary degenerative changes Upper chest: No evidence of injury IMPRESSION: 1. No evidence of intracranial or cervical spine injury. 2. Minimally depressed right orbital floor fracture. 3. Nondisplaced right pterygoid plate fractures. No complete LeFort injury. Electronically Signed   By: Monte Fantasia M.D.   On: 04/04/2020 07:20   DG Abd Portable 1V  Result Date: 04/05/2020 CLINICAL DATA:  Diarrhea EXAM: PORTABLE ABDOMEN - 1 VIEW COMPARISON:  None. FINDINGS: There is moderate stool in the colon. There is air throughout much the bowel without bowel dilatation. No air-fluid levels. No free air evident on supine imaging. Lung bases are clear. There are foci of arterial vascular calcification in the pelvis. There are surgical clips near the gastroesophageal junction. IMPRESSION: Moderate stool in the colon. No bowel obstruction or free air evident on supine examination. Lung bases clear.  Electronically Signed   By: Lowella Grip III M.D.   On: 04/05/2020 11:25   ECHOCARDIOGRAM COMPLETE  Result Date: 04/04/2020    ECHOCARDIOGRAM REPORT   Patient Name:   Travis Craig Date of Exam: 04/04/2020 Medical Rec #:  [id number]          Height:       71.0 in Accession #:    [id number]         Weight:       184.1 lb Date of Birth:  28-Mar-1941          BSA:          2.036 m Patient Age:    80 years           BP:           170/92 mmHg Patient Gender: M                  HR:           65 bpm. Exam Location:  [facility] Procedure: 2D Echo, Color Doppler and Cardiac Doppler Indications:     Elevated Troponin  History:         Patient has no prior history of Echocardiogram examinations.                  Risk Factors:Hypertension and Dyslipidemia.  Sonographer:     Sherrie Sport RDCS (AE) Referring Phys:  [id number] Arvil Chaco Diagnosing Phys: Kathlyn Sacramento MD  Sonographer Comments: Technically difficult study due to poor echo windows and no apical window. Image acquisition challenging due to respiratory motion. IMPRESSIONS  1. Left ventricular ejection fraction, by estimation, is 55 to 60%. The left ventricle has normal function. Left ventricular endocardial  border not optimally defined to evaluate regional wall motion. There is mild left ventricular hypertrophy. Left ventricular diastolic function could not be evaluated.  2. Right ventricular systolic function is normal. The right ventricular size is normal. Tricuspid regurgitation signal is inadequate for assessing PA pressure.  3. The mitral valve is normal in structure. No evidence of mitral valve regurgitation. No evidence of mitral stenosis.  4. The aortic valve is normal in structure. Aortic valve regurgitation is not visualized. No aortic stenosis is present. FINDINGS  Left Ventricle: Left ventricular ejection fraction, by estimation, is 55 to 60%. The left ventricle has normal function. Left ventricular endocardial border not optimally defined to  evaluate regional wall motion. The left ventricular internal cavity size was normal in size. There is mild left ventricular hypertrophy. Left ventricular diastolic function could not be evaluated. Right Ventricle: The right ventricular size is normal. No increase in right ventricular wall thickness. Right ventricular systolic function is normal. Tricuspid regurgitation signal is inadequate for assessing PA pressure. Left Atrium: Left atrial size was normal in size. Right Atrium: Right atrial size was normal in size. Pericardium: There is no evidence of pericardial effusion. Mitral Valve: The mitral valve is normal in structure. No evidence of mitral valve regurgitation. No evidence of mitral valve stenosis. Tricuspid Valve: The tricuspid valve is normal in structure. Tricuspid valve regurgitation is not demonstrated. No evidence of tricuspid stenosis. Aortic Valve: The aortic valve is normal in structure. Aortic valve regurgitation is not visualized. No aortic stenosis is present. Pulmonic Valve: The pulmonic valve was normal in structure. Pulmonic valve regurgitation is not visualized. No evidence of pulmonic stenosis. Aorta: The aortic root is normal in size and structure. Venous: The inferior vena cava was not well visualized. IAS/Shunts: No atrial level shunt detected by color flow Doppler.  LEFT VENTRICLE PLAX 2D LVIDd:         4.59 cm LVIDs:         3.30 cm LV PW:         1.14 cm LV IVS:        1.16 cm LVOT diam:     2.00 cm LVOT Area:     3.14 cm  LEFT ATRIUM         Index LA diam:    4.40 cm 2.16 cm/m                        PULMONIC VALVE AORTA                 PV Vmax:        0.84 m/s Ao Root diam: 3.20 cm PV Peak grad:   2.8 mmHg                       RVOT Peak grad: 4 mmHg  TRICUSPID VALVE TR Peak grad:   17.0 mmHg TR Vmax:        206.00 cm/s  SHUNTS Systemic Diam: 2.00 cm Kathlyn Sacramento MD Electronically signed by Kathlyn Sacramento MD Signature Date/Time: 04/04/2020/3:15:12 PM    Final    CT  Maxillofacial WO CM  Result Date: 04/04/2020 CLINICAL DATA:  Fall with facial trauma EXAM: CT HEAD WITHOUT CONTRAST CT MAXILLOFACIAL WITHOUT CONTRAST CT CERVICAL SPINE WITHOUT CONTRAST TECHNIQUE: Multidetector CT imaging of the head, cervical spine, and maxillofacial structures were performed using the standard protocol without intravenous contrast. Multiplanar CT image reconstructions of the cervical spine and maxillofacial structures were also generated.  COMPARISON:  Head CT 04/02/2020 FINDINGS: CT HEAD FINDINGS Brain: No evidence of acute infarction, hemorrhage, hydrocephalus, extra-axial collection or mass lesion/mass effect. Age normal appearance of the brain. Vascular: No hyperdense vessel or unexpected calcification. Skull: Large right forehead hematoma.  No calvarial fracture. CT MAXILLOFACIAL FINDINGS Osseous: Minimally depressed right orbital floor fracture. Nondisplaced fractures of the right medial and lateral pterygoid plates with probable continuation along the wall of the right nasal cavity, but no complete LeFort injury. The mandible is intact and located. Large periapical erosion around tooth 3, dehiscent into the sinus. Orbits: Mild extraconal hemorrhage along the orbital floor fracture on the right. Bilateral cataract resection. Sinuses: Low-density/inflammatory appearing mucosal thickening at the right more than left maxillary sinus. Soft tissues: Large hematoma above the right orbit. CT CERVICAL SPINE FINDINGS Alignment: No traumatic malalignment Skull base and vertebrae: No acute fracture Soft tissues and spinal canal: No prevertebral fluid or swelling. No visible canal hematoma. Disc levels:  Ordinary degenerative changes Upper chest: No evidence of injury IMPRESSION: 1. No evidence of intracranial or cervical spine injury. 2. Minimally depressed right orbital floor fracture. 3. Nondisplaced right pterygoid plate fractures. No complete LeFort injury. Electronically Signed   By: Monte Fantasia M.D.   On: 04/04/2020 07:20   US ABDOMEN LIMITED RUQ (LIVER/GB)  Result Date: 04/06/2020 CLINICAL DATA:  Abnormal LFTs. EXAM: ULTRASOUND ABDOMEN LIMITED RIGHT UPPER QUADRANT COMPARISON:  04/05/2020. FINDINGS: Gallbladder: Surgically absent. Common bile duct: Diameter: 4.3 mm Liver: No focal lesion identified. Increased parenchymal echogenicity. Portal vein is patent on color Doppler imaging with normal direction of blood flow towards the liver. Other: None. IMPRESSION: Hepatic steatosis.  No focal hepatic lesion. Post cholecystectomy sequela. Electronically Signed   By: Primitivo Gauze M.D.   On: 04/06/2020 10:42    Labs: BNP (last 3 results) Recent Labs    04/04/20 0649  BNP [id number]*   Basic Metabolic Panel: Recent Labs  Lab 04/10/20 0417 04/11/20 0424 04/12/20 0616 04/13/20 0700  NA 148* 144 148* 146*  K 2.7* 3.4* 3.6 4.0  CL 113* 114* 114* 115*  CO2 25 24 27 25   GLUCOSE 131* 117* 121* 128*  BUN 18 17 23 19   CREATININE 0.59* 0.64 0.64 0.60*  CALCIUM 8.4* 8.5* 8.8* 9.1  MG  --   --   --  1.9  PHOS  --   --   --  3.0   Liver Function Tests: Recent Labs  Lab 04/10/20 0417 04/11/20 0424 04/12/20 0616  AST 125* 96* 79*  ALT 73* 61* 54*  ALKPHOS 52 58 63  BILITOT 1.6* 1.3* 1.7*  PROT 6.7 5.9* 6.1*  ALBUMIN 3.2* 2.9* 2.8*   No results for input(s): LIPASE, AMYLASE in the last 168 hours. No results for input(s): AMMONIA in the last 168 hours. CBC: No results for input(s): WBC, NEUTROABS, HGB, HCT, MCV, PLT in the last 168 hours. Cardiac Enzymes: Recent Labs  Lab 04/10/20 0417 04/11/20 0424 04/12/20 0616  CKTOTAL 442* 313 205   BNP: Invalid input(s): POCBNP CBG: No results for input(s): GLUCAP in the last 168 hours. D-Dimer No results for input(s): DDIMER in the last 72 hours. Hgb A1c No results for input(s): HGBA1C in the last 72 hours. Lipid Profile No results for input(s): CHOL, HDL, LDLCALC, TRIG, CHOLHDL, LDLDIRECT in the last 72  hours. Thyroid function studies No results for input(s): TSH, T4TOTAL, T3FREE, THYROIDAB in the last 72 hours.  Invalid input(s): FREET3 Anemia work up No results for input(s): VITAMINB12, FOLATE, FERRITIN,  TIBC, IRON, RETICCTPCT in the last 72 hours. Urinalysis    Component Value Date/Time   COLORURINE YELLOW (A) 04/04/2020 1200   APPEARANCEUR HAZY (A) 04/04/2020 1200   LABSPEC 1.008 04/04/2020 1200   PHURINE 6.0 04/04/2020 1200   GLUCOSEU NEGATIVE 04/04/2020 1200   HGBUR LARGE (A) 04/04/2020 1200   BILIRUBINUR NEGATIVE 04/04/2020 1200   KETONESUR NEGATIVE 04/04/2020 1200   PROTEINUR 30 (A) 04/04/2020 1200   NITRITE POSITIVE (A) 04/04/2020 1200   LEUKOCYTESUR MODERATE (A) 04/04/2020 1200   Sepsis Labs Invalid input(s): PROCALCITONIN,  WBC,  LACTICIDVEN Microbiology No results found for this or any previous visit (from the past 240 hour(s)).   Time coordinating discharge: 35 minutes  SIGNED: Antonieta Pert, MD  [facility] 04/16/2020, 12:48 PM  If 7PM-7AM, please contact night-coverage [email or web address]

## 2020-04-16 NOTE — Progress Notes (Signed)
Patient with noted shallow breathing no noted mottling and patient's family at bedside. Writer gave report to receiving hospice nurse and she verbalized understanding of report given and she also spoke with patient family. Patient did have left FA IV in place flushing well with blood return. Writer assisted EMS with transferring patient and patient tolerated well. Paperwork given to EMS transporters.

## 2020-05-17 DEATH — deceased

## 2020-07-29 ENCOUNTER — Ambulatory Visit: Payer: Medicare Other | Admitting: Family Medicine
